# Patient Record
Sex: Female | Born: 1950
Health system: Southern US, Community
[De-identification: ages and names within clinical notes are randomized; demographics above are authoritative.]

## PROBLEM LIST (undated history)

## (undated) DIAGNOSIS — I10 Essential (primary) hypertension: Secondary | ICD-10-CM

## (undated) DIAGNOSIS — T7840XA Allergy, unspecified, initial encounter: Secondary | ICD-10-CM

## (undated) DIAGNOSIS — E7800 Pure hypercholesterolemia, unspecified: Secondary | ICD-10-CM

## (undated) DIAGNOSIS — M1990 Unspecified osteoarthritis, unspecified site: Secondary | ICD-10-CM

## (undated) DIAGNOSIS — K219 Gastro-esophageal reflux disease without esophagitis: Secondary | ICD-10-CM

## (undated) DIAGNOSIS — H269 Unspecified cataract: Secondary | ICD-10-CM

## (undated) DIAGNOSIS — L409 Psoriasis, unspecified: Secondary | ICD-10-CM

## (undated) DIAGNOSIS — Z5189 Encounter for other specified aftercare: Secondary | ICD-10-CM

## (undated) DIAGNOSIS — F1010 Alcohol abuse, uncomplicated: Secondary | ICD-10-CM

## (undated) DIAGNOSIS — R911 Solitary pulmonary nodule: Secondary | ICD-10-CM

## (undated) DIAGNOSIS — F419 Anxiety disorder, unspecified: Secondary | ICD-10-CM

## (undated) DIAGNOSIS — K635 Polyp of colon: Secondary | ICD-10-CM

## (undated) DIAGNOSIS — Z8659 Personal history of other mental and behavioral disorders: Secondary | ICD-10-CM

## (undated) DIAGNOSIS — E079 Disorder of thyroid, unspecified: Secondary | ICD-10-CM

## (undated) DIAGNOSIS — C801 Malignant (primary) neoplasm, unspecified: Secondary | ICD-10-CM

## (undated) HISTORY — PX: CYST REMOVAL HAND: SHX6279

## (undated) HISTORY — DX: Solitary pulmonary nodule: R91.1

## (undated) HISTORY — DX: Psoriasis, unspecified: L40.9

## (undated) HISTORY — DX: Personal history of other mental and behavioral disorders: Z86.59

## (undated) HISTORY — DX: Encounter for other specified aftercare: Z51.89

## (undated) HISTORY — DX: Unspecified osteoarthritis, unspecified site: M19.90

## (undated) HISTORY — DX: Unspecified cataract: H26.9

## (undated) HISTORY — DX: Polyp of colon: K63.5

## (undated) HISTORY — DX: Gastro-esophageal reflux disease without esophagitis: K21.9

## (undated) HISTORY — DX: Anxiety disorder, unspecified: F41.9

## (undated) HISTORY — DX: Allergy, unspecified, initial encounter: T78.40XA

## (undated) HISTORY — DX: Alcohol abuse, uncomplicated: F10.10

## (undated) HISTORY — PX: DILATION AND CURETTAGE OF UTERUS: SHX78

## (undated) HISTORY — DX: Disorder of thyroid, unspecified: E07.9

## (undated) HISTORY — DX: Malignant (primary) neoplasm, unspecified: C80.1

## (undated) HISTORY — PX: COLONOSCOPY: SHX174

---

## 1987-06-06 DIAGNOSIS — Z5189 Encounter for other specified aftercare: Secondary | ICD-10-CM

## 1987-06-06 HISTORY — DX: Encounter for other specified aftercare: Z51.89

## 1998-06-05 HISTORY — PX: BREAST BIOPSY: SHX20

## 2007-07-03 ENCOUNTER — Encounter: Payer: Self-pay | Admitting: Cardiology

## 2011-07-04 LAB — HM COLONOSCOPY

## 2013-02-05 ENCOUNTER — Emergency Department (HOSPITAL_COMMUNITY)
Admission: EM | Admit: 2013-02-05 | Discharge: 2013-02-05 | Disposition: A | Discharge status: Home / Self Care | Payer: BC Managed Care – PPO | Attending: Internal Medicine | Admitting: Internal Medicine

## 2013-02-05 ENCOUNTER — Encounter (HOSPITAL_COMMUNITY): Payer: Self-pay | Admitting: Emergency Medicine

## 2013-02-05 ENCOUNTER — Emergency Department (HOSPITAL_COMMUNITY): Payer: BC Managed Care – PPO

## 2013-02-05 DIAGNOSIS — E785 Hyperlipidemia, unspecified: Secondary | ICD-10-CM | POA: Diagnosis present

## 2013-02-05 DIAGNOSIS — R0789 Other chest pain: Secondary | ICD-10-CM | POA: Insufficient documentation

## 2013-02-05 DIAGNOSIS — F329 Major depressive disorder, single episode, unspecified: Secondary | ICD-10-CM | POA: Diagnosis present

## 2013-02-05 DIAGNOSIS — R079 Chest pain, unspecified: Secondary | ICD-10-CM

## 2013-02-05 DIAGNOSIS — Z862 Personal history of diseases of the blood and blood-forming organs and certain disorders involving the immune mechanism: Secondary | ICD-10-CM | POA: Insufficient documentation

## 2013-02-05 DIAGNOSIS — F3289 Other specified depressive episodes: Secondary | ICD-10-CM

## 2013-02-05 DIAGNOSIS — Z7982 Long term (current) use of aspirin: Secondary | ICD-10-CM | POA: Insufficient documentation

## 2013-02-05 DIAGNOSIS — R0602 Shortness of breath: Secondary | ICD-10-CM | POA: Insufficient documentation

## 2013-02-05 DIAGNOSIS — Z8639 Personal history of other endocrine, nutritional and metabolic disease: Secondary | ICD-10-CM | POA: Insufficient documentation

## 2013-02-05 DIAGNOSIS — R5381 Other malaise: Secondary | ICD-10-CM | POA: Insufficient documentation

## 2013-02-05 DIAGNOSIS — I10 Essential (primary) hypertension: Secondary | ICD-10-CM

## 2013-02-05 DIAGNOSIS — R42 Dizziness and giddiness: Secondary | ICD-10-CM | POA: Insufficient documentation

## 2013-02-05 DIAGNOSIS — K219 Gastro-esophageal reflux disease without esophagitis: Secondary | ICD-10-CM

## 2013-02-05 DIAGNOSIS — Z79899 Other long term (current) drug therapy: Secondary | ICD-10-CM | POA: Insufficient documentation

## 2013-02-05 HISTORY — DX: Pure hypercholesterolemia, unspecified: E78.00

## 2013-02-05 HISTORY — DX: Essential (primary) hypertension: I10

## 2013-02-05 LAB — BASIC METABOLIC PANEL
BUN: 10 mg/dL (ref 6–23)
CO2: 28 mEq/L (ref 19–32)
Calcium: 9.6 mg/dL (ref 8.4–10.5)
Creatinine, Ser: 0.58 mg/dL (ref 0.50–1.10)
GFR calc non Af Amer: >90 mL/min (ref >90)
Glucose, Bld: 114 mg/dL — ABNORMAL HIGH (ref 70–99)

## 2013-02-05 LAB — CBC
HCT: 40.5 % (ref 36.0–46.0)
Hemoglobin: 14.1 g/dL (ref 12.0–15.0)
MCH: 30.9 pg (ref 26.0–34.0)
MCHC: 34.8 g/dL (ref 30.0–36.0)
MCV: 88.8 fL (ref 78.0–100.0)
RBC: 4.56 MIL/uL (ref 3.87–5.11)

## 2013-02-05 NOTE — Consult Note (Signed)
[name] Medical Consultation  Christy Freeman EML:[id number] DOB: [birth year] DOA: 02/05/2013 PCP: Pcp Not In System   Requesting physician: Thurnell Garbe Date of consultation: 02/05/13 Reason for consultation: chest discomfort  Impression/Recommendations    Chest pain: Patient's pain is atypical. She currently feels well and is requesting discharge. She has had 2 negative troponins, and EKG is normal. She may be discharged home and I have arranged followup with [facility] to consider outpatient stress test. Active Problems:   Hypertension: Patient reports her blood pressure was quite this morning. Currently well controlled. Continue outpatient regimen.   GERD (gastroesophageal reflux disease): Continue omeprazole   Hyperlipidemia, reportedly diet controlled   Depression  Chief Complaint: Chest discomfort and high blood pressure  HPI:  Patient is a 62 year old white female with history of hypertension who had chest tightness yesterday after eating [place] food than sitting in a hot car. She woke up at 2 AM and "felt funny". Her blood pressure was reportedly very high when she checked it at home. It was high again this morning so she came to the emergency room. Currently she has no chest pain. She feels fine. She has had a lot of life stressors recently and thinks her symptoms may be related to that, or possibly indigestion. She had a stress echocardiogram 15 years ago which was reportedly unremarkable. She has a history of hypertension and reportedly diet controlled hyperlipidemia. Her brother had an MI in his early 69s. Hospitalists were consulted for admission, the patient is currently requesting discharge.  Review of Systems:  Systems reviewed and as above otherwise negative.  Past Medical History  Diagnosis Date  . Hypertension   . Hypercholesterolemia     diet controlled   History reviewed. No pertinent past surgical history. Social History: Patient is married. She does not  smoke. She reports drinking "too much". She drinks about a bottle of wine daily, sometimes more, sometimes less. Occasionally has a gin and tonic as well.  No Known Allergies Family History  Problem Relation Age of Onset  . Diabetes Mother   . Heart failure Mother   . Stroke Father   . Heart disease Brother     Prior to Admission medications   Medication Sig Start Date End Date Taking? Authorizing Provider  amLODipine-olmesartan (AZOR) 5-40 MG per tablet Take 1 tablet by mouth daily.   Yes Historical Provider, MD  aspirin EC 81 MG tablet Take 81 mg by mouth 2 (two) times daily.   Yes Historical Provider, MD  calcium carbonate (OS-CAL - DOSED IN MG OF ELEMENTAL CALCIUM) 1250 MG tablet Take 1,250 mg by mouth daily.    Yes Historical Provider, MD  metoprolol succinate (TOPROL-XL) 100 MG 24 hr tablet Take 100 mg by mouth daily. Take with or immediately following a meal.   Yes Historical Provider, MD  omeprazole (PRILOSEC) 20 MG capsule Take 20 mg by mouth daily.   Yes Historical Provider, MD  venlafaxine XR (EFFEXOR-XR) 150 MG 24 hr capsule Take 150 mg by mouth daily.   Yes Historical Provider, MD   Physical Exam: Blood pressure 153/82, pulse 82, temperature 97.7 F (36.5 C), temperature source Oral, resp. rate 21, SpO2 97.00%. Filed Vitals:   02/05/13 1630  BP: 153/82  Pulse: 82  Temp:   Resp: 21   BP 153/82  Pulse 82  Temp(Src) 97.7 F (36.5 C) (Oral)  Resp 21  SpO2 97%  General Appearance:    Alert, cooperative, no distress, appears stated age  Head:  Normocephalic, without obvious abnormality, atraumatic  Eyes:    PERRL, conjunctiva/corneas clear, EOM's intact, fundi    benign, both eyes     Nose:   Nares normal, septum midline, mucosa normal, no drainage    or sinus tenderness  Throat:   Lips, mucosa, and tongue normal; teeth and gums normal  Neck:   Supple, symmetrical, trachea midline, no adenopathy;    thyroid:  no enlargement/tenderness/nodules; no carotid    bruit or JVD  Back:     Symmetric, no curvature, ROM normal, no CVA tenderness  Lungs:     Clear to auscultation bilaterally, respirations unlabored  Chest Wall:    No tenderness or deformity   Heart:    Regular rate and rhythm, S1 and S2 normal, no murmur, rub   or gallop     Abdomen:     Soft, non-tender, bowel sounds active all four quadrants,    no masses, no organomegaly  Genitalia:   deferred  Rectal:   deferred  Extremities:   Extremities normal, atraumatic, no cyanosis or edema  Pulses:   2+ and symmetric all extremities  Skin:   Skin color, texture, turgor normal, no rashes or lesions  Lymph nodes:   Cervical, supraclavicular, and axillary nodes normal  Neurologic:   CNII-XII intact, normal strength, sensation and reflexes    throughout    Psychiatric: normal affect  Labs on Admission:  Basic Metabolic Panel:  Recent Labs Lab 02/05/13 1200  NA 136  K 4.0  CL 99  CO2 28  GLUCOSE 114*  BUN 10  CREATININE 0.58  CALCIUM 9.6   Liver Function Tests: No results found for this basename: AST, ALT, ALKPHOS, BILITOT, PROT, ALBUMIN,  in the last 168 hours No results found for this basename: LIPASE, AMYLASE,  in the last 168 hours No results found for this basename: AMMONIA,  in the last 168 hours CBC:  Recent Labs Lab 02/05/13 1200  WBC 10.0  HGB 14.1  HCT 40.5  MCV 88.8  PLT 308   Cardiac Enzymes:  Recent Labs Lab 02/05/13 1224 02/05/13 1643  TROPONINI <0.30 <0.30   BNP: No components found with this basename: POCBNP,  CBG: No results found for this basename: GLUCAP,  in the last 168 hours  Radiological Exams on Admission: Dg Chest 2 View  02/05/2013   *RADIOLOGY REPORT*  Clinical Data: Chest pain and pressure, dyspnea, history hypertension  CHEST - 2 VIEW  Comparison: None.  Findings: Normal cardiac silhouette and mediastinal contours.  No focal parenchymal opacities.  No pleural effusion or pneumothorax. No evidence of edema.  No acute osseous  abnormalities.  IMPRESSION: No acute cardiopulmonary disease.   Original Report Authenticated By: Jake Seats, MD    EKG: NSR  Time spent: 60 minutes  [facility] Pager [phone]  If 7PM-7AM, please contact night-coverage [email or web address] Password [facility] 02/05/2013, 6:04 PM

## 2013-02-05 NOTE — ED Notes (Signed)
Pt c/o generalized CP and heaviness x 2 days with light headedness; pt sts after eating [place] but denies hx of indigestion afterwards; pt sts some SOB and htn

## 2013-02-05 NOTE — ED Notes (Signed)
Introduced self to pt and husband.  Pt denies chest pain/heaviness at present.  Eating crackers and peanut butter.  Skin pink, wm, dry.  SR on monitor, see vitals.

## 2013-02-05 NOTE — ED Provider Notes (Signed)
CSN: [id number]     Arrival date & time 02/05/13  1149 History   First MD Initiated Contact with Patient 02/05/13 1223     Chief Complaint  Patient presents with  . Chest Pain    HPI Pt was seen at 1255. Per pt and her husband, c/o gradual onset and persistence of multiple intermittent episodes of chest discomfort and SOB since yesterday. States she was "out shopping in the heat" and recurrently felt chest "heaviness"/"pressure" and SOB when she walked to her car and sat down. Symptoms resolved with sitting. Describes her SOB as "hard to breath in."  Pt states the chest discomfort also occurred last evening. States her symptoms were resolved by this morning but she "just doesn't feel right" today, "weak" and "lightheaded." States her BP this morning was "180/100," which is unusual for her. Denies palpitations, no cough, no abd pain, no N/V/D, no back pain.    PMD: Dr. Vista Lawman in [place] Past Medical History  Diagnosis Date  . Hypertension   . Hypercholesterolemia     diet controlled   History reviewed. No pertinent past surgical history.   Family History  Problem Relation Age of Onset  . Diabetes Mother   . Heart failure Mother   . Stroke Father    History  Substance Use Topics  . Smoking status: Never Smoker   . Smokeless tobacco: Not on file  . Alcohol Use: Yes    Review of Systems ROS: Statement: All systems negative except as marked or noted in the HPI; Constitutional: Negative for fever and chills. ; ; Eyes: Negative for eye pain, redness and discharge. ; ; ENMT: Negative for ear pain, hoarseness, nasal congestion, sinus pressure and sore throat. ; ; Cardiovascular: +CP, SOB. Negative for palpitations, diaphoresis, and peripheral edema. ; ; Respiratory: Negative for cough, wheezing and stridor. ; ; Gastrointestinal: Negative for nausea, vomiting, diarrhea, abdominal pain, blood in stool, hematemesis, jaundice and rectal bleeding. . ; ; Genitourinary: Negative for dysuria, flank  pain and hematuria. ; ; Musculoskeletal: Negative for back pain and neck pain. Negative for swelling and trauma.; ; Skin: Negative for pruritus, rash, abrasions, blisters, bruising and skin lesion.; ; Neuro: Negative for headache, lightheadedness and neck stiffness. Negative for weakness, altered level of consciousness , altered mental status, extremity weakness, paresthesias, involuntary movement, seizure and syncope.       Allergies  Review of patient's allergies indicates no known allergies.  Home Medications   Current Outpatient Rx  Name  Route  Sig  Dispense  Refill  . amLODipine-olmesartan (AZOR) 5-40 MG per tablet   Oral   Take 1 tablet by mouth daily.         Marland Kitchen aspirin EC 81 MG tablet   Oral   Take 81 mg by mouth 2 (two) times daily.         . calcium carbonate (OS-CAL - DOSED IN MG OF ELEMENTAL CALCIUM) 1250 MG tablet   Oral   Take 1,250 mg by mouth daily.          . metoprolol succinate (TOPROL-XL) 100 MG 24 hr tablet   Oral   Take 100 mg by mouth daily. Take with or immediately following a meal.         . omeprazole (PRILOSEC) 20 MG capsule   Oral   Take 20 mg by mouth daily.         Marland Kitchen venlafaxine XR (EFFEXOR-XR) 150 MG 24 hr capsule   Oral   Take 150 mg  by mouth daily.          BP 141/87  Pulse 72  Temp(Src) 97.7 F (36.5 C) (Oral)  Resp 13  SpO2 95% Physical Exam 1300: Physical examination:  Nursing notes reviewed; Vital signs and O2 SAT reviewed;  Constitutional: Well developed, Well nourished, Well hydrated, In no acute distress; Head:  Normocephalic, atraumatic; Eyes: EOMI, PERRL, No scleral icterus; ENMT: Mouth and pharynx normal, Mucous membranes moist; Neck: Supple, Full range of motion, No lymphadenopathy; Cardiovascular: Regular rate and rhythm, No gallop; Respiratory: Breath sounds clear & equal bilaterally, No rales, rhonchi, wheezes.  Speaking full sentences with ease, Normal respiratory effort/excursion; Chest: Nontender, Movement  normal; Abdomen: Soft, Nontender, Nondistended, Normal bowel sounds; Genitourinary: No CVA tenderness; Extremities: Pulses normal, No tenderness, No edema, No calf edema or asymmetry.; Neuro: AA&Ox3, Major CN grossly intact.  Speech clear. No gross focal motor or sensory deficits in extremities.; Skin: Color normal, Warm, Dry.   ED Course  Procedures      MDM  MDM Reviewed: previous chart, nursing note and vitals Reviewed previous: labs Interpretation: labs, ECG and x-ray    Date: 02/05/2013  Rate: 74  Rhythm: normal sinus rhythm  QRS Axis: normal  Intervals: normal  ST/T Wave abnormalities: normal  Conduction Disutrbances:none  Narrative Interpretation:   Old EKG Reviewed: none available.  Results for orders placed during the hospital encounter of 02/05/13  CBC      Result Value Range   WBC 10.0  4.0 - 10.5 K/uL   RBC 4.56  3.87 - 5.11 MIL/uL   Hemoglobin 14.1  12.0 - 15.0 g/dL   HCT 40.5  36.0 - 46.0 %   MCV 88.8  78.0 - 100.0 fL   MCH 30.9  26.0 - 34.0 pg   MCHC 34.8  30.0 - 36.0 g/dL   RDW 13.3  11.5 - 15.5 %   Platelets 308  150 - 400 K/uL  BASIC METABOLIC PANEL      Result Value Range   Sodium 136  135 - 145 mEq/L   Potassium 4.0  3.5 - 5.1 mEq/L   Chloride 99  96 - 112 mEq/L   CO2 28  19 - 32 mEq/L   Glucose, Bld 114 (*) 70 - 99 mg/dL   BUN 10  6 - 23 mg/dL   Creatinine, Ser 0.58  0.50 - 1.10 mg/dL   Calcium 9.6  8.4 - 10.5 mg/dL   GFR calc non Af Amer >90  >90 mL/min   GFR calc Af Amer >90  >90 mL/min  TROPONIN I      Result Value Range   Troponin I <0.30  <0.30 ng/mL  POCT I-STAT TROPONIN I      Result Value Range   Troponin i, poc 0.00  0.00 - 0.08 ng/mL   Comment 3            Dg Chest 2 View 02/05/2013   *RADIOLOGY REPORT*  Clinical Data: Chest pain and pressure, dyspnea, history hypertension  CHEST - 2 VIEW  Comparison: None.  Findings: Normal cardiac silhouette and mediastinal contours.  No focal parenchymal opacities.  No pleural effusion or  pneumothorax. No evidence of edema.  No acute osseous abnormalities.  IMPRESSION: No acute cardiopulmonary disease.   Original Report Authenticated By: Jake Seats, MD     1550:  Pt states she "ate [place] food yesterday" but denies hx of indigestion or hx of these/similar symptoms after eating certain foods. Pt TIMI 2. Dx and testing  d/w pt and family.  Questions answered.  Verb understanding, agreeable to evaluation by [facility] for admit.  T/C to [facility] Dr. Conley [facility], case discussed, including:  HPI, pertinent PM/SHx, VS/PE, dx testing, ED course and treatment:  Agreeable to come to ED for eval.          Alfonzo Feller, DO 02/05/13 6270

## 2013-02-05 NOTE — ED Provider Notes (Signed)
6:55 PM Dr. Conley Canal has discharged patient after negative delta troponin.    Christy Siren, MD 02/05/13 [phone]

## 2013-02-05 NOTE — Discharge Instructions (Signed)
Chest Pain (Nonspecific) °It is often hard to give a specific diagnosis for the cause of chest pain. There is always a chance that your pain could be related to something serious, such as a heart attack or a blood clot in the lungs. You need to follow up with your caregiver for further evaluation. °CAUSES  °· Heartburn. °· Pneumonia or bronchitis. °· Anxiety or stress. °· Inflammation around your heart (pericarditis) or lung (pleuritis or pleurisy). °· A blood clot in the lung. °· A collapsed lung (pneumothorax). It can develop suddenly on its own (spontaneous pneumothorax) or from injury (trauma) to the chest. °· Shingles infection (herpes zoster virus). °The chest wall is composed of bones, muscles, and cartilage. Any of these can be the source of the pain. °· The bones can be bruised by injury. °· The muscles or cartilage can be strained by coughing or overwork. °· The cartilage can be affected by inflammation and become sore (costochondritis). °DIAGNOSIS  °Lab tests or other studies, such as X-rays, electrocardiography, stress testing, or cardiac imaging, may be needed to find the cause of your pain.  °TREATMENT  °· Treatment depends on what may be causing your chest pain. Treatment may include: °· Acid blockers for heartburn. °· Anti-inflammatory medicine. °· Pain medicine for inflammatory conditions. °· Antibiotics if an infection is present. °· You may be advised to change lifestyle habits. This includes stopping smoking and avoiding alcohol, caffeine, and chocolate. °· You may be advised to keep your head raised (elevated) when sleeping. This reduces the chance of acid going backward from your stomach into your esophagus. °· Most of the time, nonspecific chest pain will improve within 2 to 3 days with rest and mild pain medicine. °HOME CARE INSTRUCTIONS  °· If antibiotics were prescribed, take your antibiotics as directed. Finish them even if you start to feel better. °· For the next few days, avoid physical  activities that bring on chest pain. Continue physical activities as directed. °· Do not smoke. °· Avoid drinking alcohol. °· Only take over-the-counter or prescription medicine for pain, discomfort, or fever as directed by your caregiver. °· Follow your caregiver's suggestions for further testing if your chest pain does not go away. °· Keep any follow-up appointments you made. If you do not go to an appointment, you could develop lasting (chronic) problems with pain. If there is any problem keeping an appointment, you must call to reschedule. °SEEK MEDICAL CARE IF:  °· You think you are having problems from the medicine you are taking. Read your medicine instructions carefully. °· Your chest pain does not go away, even after treatment. °· You develop a rash with blisters on your chest. °SEEK IMMEDIATE MEDICAL CARE IF:  °· You have increased chest pain or pain that spreads to your arm, neck, jaw, back, or abdomen. °· You develop shortness of breath, an increasing cough, or you are coughing up blood. °· You have severe back or abdominal pain, feel nauseous, or vomit. °· You develop severe weakness, fainting, or chills. °· You have a fever. °THIS IS AN EMERGENCY. Do not wait to see if the pain will go away. Get medical help at once. Call your local emergency services (911 in [place] Do not drive yourself to the hospital. °MAKE SURE YOU:  °· Understand these instructions. °· Will watch your condition. °· Will get help right away if you are not doing well or get worse. °Document Released: 03/01/2005 Document Revised: 08/14/2011 Document Reviewed: 12/26/2007 °Exit[employer]® Patient Information ©2014 [employer]   [employer]. ° °

## 2013-02-05 NOTE — ED Notes (Signed)
No change in status.  Decision to recheck troponin and if negative, will discharge pt to home.

## 2013-03-06 ENCOUNTER — Encounter: Payer: Self-pay | Admitting: Cardiology

## 2013-03-18 ENCOUNTER — Encounter: Payer: Self-pay | Admitting: *Deleted

## 2013-03-18 ENCOUNTER — Other Ambulatory Visit: Payer: Self-pay | Admitting: *Deleted

## 2013-03-21 ENCOUNTER — Ambulatory Visit (INDEPENDENT_AMBULATORY_CARE_PROVIDER_SITE_OTHER): Payer: BC Managed Care – PPO | Admitting: Cardiology

## 2013-03-21 ENCOUNTER — Ambulatory Visit: Payer: BC Managed Care – PPO | Admitting: Cardiology

## 2013-03-21 ENCOUNTER — Encounter: Payer: Self-pay | Admitting: Cardiology

## 2013-03-21 VITALS — BP 146/86 | HR 76 | Ht 64.0 in | Wt 187.0 lb

## 2013-03-21 DIAGNOSIS — R079 Chest pain, unspecified: Secondary | ICD-10-CM

## 2013-03-21 DIAGNOSIS — E785 Hyperlipidemia, unspecified: Secondary | ICD-10-CM

## 2013-03-21 MED ORDER — LISINOPRIL 40 MG PO TABS: 40.0000 mg | ORAL_TABLET | Freq: Every day | ORAL | Status: DC

## 2013-03-21 MED ORDER — AMLODIPINE BESYLATE 10 MG PO TABS: 10.0000 mg | ORAL_TABLET | Freq: Every day | ORAL | Status: DC

## 2013-03-21 NOTE — Progress Notes (Signed)
Patient ID: Trinidy Masterson, female   DOB: [birth year], 62 y.o.   MRN: [id number]    Patient Name: Christy Freeman Date of Encounter: 03/21/2013  Primary Care Provider:  Pcp Not In System Primary Cardiologist:  Ena Dawley, H  Patient Profile Post ER visit for CP  Problem List   Past Medical History  Diagnosis Date  . Hypertension   . Hypercholesterolemia     diet controlled  . GERD (gastroesophageal reflux disease)   . H/O: depression    No past surgical history on file.  Allergies  No Known Allergies  HPI  62 year old female with HTN, HLP (diet controlled), that went to ER the last month for multiple episodes of chest pain with gradual onset and persistence associated with SOB for 2 days. States she was "out shopping in the heat" and recurrently felt chest "heaviness"/"pressure" and SOB when she walked to her car and sat down. Symptoms resolved with sitting. Describes her SOB as "hard to breath in." She was found to have BP 180/100. ACS was ruled out. No chest pain episodes for the last month.   Home Medications  Prior to Admission medications   Medication Sig Start Date End Date Taking? Authorizing Provider  amLODipine-olmesartan (AZOR) 5-40 MG per tablet Take 1 tablet by mouth daily.   Yes Historical Provider, MD  aspirin EC 81 MG tablet Take 81 mg by mouth 2 (two) times daily.   Yes Historical Provider, MD  calcium carbonate (OS-CAL - DOSED IN MG OF ELEMENTAL CALCIUM) 1250 MG tablet Take 1,250 mg by mouth daily.    Yes Historical Provider, MD  metoprolol succinate (TOPROL-XL) 100 MG 24 hr tablet Take 100 mg by mouth daily. Take with or immediately following a meal.   Yes Historical Provider, MD  omeprazole (PRILOSEC) 20 MG capsule Take 20 mg by mouth daily.   Yes Historical Provider, MD  venlafaxine XR (EFFEXOR-XR) 150 MG 24 hr capsule Take 150 mg by mouth daily.   Yes Historical Provider, MD    Family History  Family History  Problem Relation Age of Onset  .  Diabetes Mother   . Heart failure Mother   . Stroke Father   . Heart disease Brother     Social History  History   Social History  . Marital Status: Married    Spouse Name: N/A    Number of Children: N/A  . Years of Education: N/A   Occupational History  . Not on file.   Social History Main Topics  . Smoking status: Never Smoker   . Smokeless tobacco: Not on file  . Alcohol Use: Yes  . Drug Use: No  . Sexual Activity: Not on file   Other Topics Concern  . Not on file   Social History Narrative  . No narrative on file     Review of Systems General:  No chills, fever, night sweats or weight changes.  Cardiovascular:  No chest pain, dyspnea on exertion, edema, orthopnea, palpitations, paroxysmal nocturnal dyspnea. Dermatological: No rash, lesions/masses Respiratory: No cough, dyspnea Urologic: No hematuria, dysuria Abdominal:   No nausea, vomiting, diarrhea, bright red blood per rectum, melena, or hematemesis Neurologic:  No visual changes, wkns, changes in mental status. All other systems reviewed and are otherwise negative except as noted above.  Physical Exam  Blood pressure 146/86, pulse 76, height 5' 4"  (1.626 m), weight 187 lb (84.823 kg), SpO2 97.00%.  General: Pleasant, NAD Psych: Normal affect. Neuro: Alert and oriented X 3. Moves all  extremities spontaneously. HEENT: Normal  Neck: Supple without bruits or JVD. Lungs:  Resp regular and unlabored, CTA. Heart: RRR no s3, s4, or murmurs. Abdomen: Soft, non-tender, non-distended, BS + x 4.  Extremities: No clubbing, cyanosis or edema. DP/PT/Radials 2+ and equal bilaterally.  Accessory Clinical Findings  ECG - SR, 71 BPM, normal ECG  Assessment & Plan  A very pleasant 62 year old female with h/o poorly controlled HTN and new chest pain  1. Chest pain seems to be related to the episodes of high blood pressure. We will increase amlodipine to 10 mg daily, switch olmesartan to lisinopril 40 mg daily,  continue toprol xl 100 mg daily, we will schedule an exercise stress test that will assess BP control to stress on this new BP regimen and also evaluate for ischemia   2. HTN - as above, check echocardiogram to evaluate for LV fx, diast fx, LVH  3. HLP - on diet, we will check lipid prifile and CMP  Follow up in 3 weeks   Ena Dawley, Lemmie Evens, MD 03/21/2013, 10:15 AM

## 2013-03-21 NOTE — Patient Instructions (Addendum)
**Note De-Identified Chrystel Barefield Obfuscation** Your physician has requested that you have an echocardiogram. Echocardiography is a painless test that uses sound waves to create images of your heart. It provides your doctor with information about the size and shape of your heart and how well your heart's chambers and valves are working. This procedure takes approximately one hour. There are no restrictions for this procedure.  Your physician has requested that you have en exercise stress myoview. For further information please visit [email or web address]. Please follow instruction sheet, as given.  Your physician has recommended you make the following change in your medication: stop taking Azor and start taking Lisinopril 40 mg daily and Amlodipine 10 mg daily   Your physician recommends that you return for lab work in: on day that you have Stress test. Please do not eat or drink after midnight the night before labs are drawn.  Your physician recommends that you schedule a follow-up appointment in: 3 weeks

## 2013-03-24 ENCOUNTER — Other Ambulatory Visit (INDEPENDENT_AMBULATORY_CARE_PROVIDER_SITE_OTHER): Payer: BC Managed Care – PPO

## 2013-03-24 ENCOUNTER — Ambulatory Visit (HOSPITAL_COMMUNITY): Payer: BC Managed Care – PPO | Attending: Cardiology | Admitting: Radiology

## 2013-03-24 VITALS — BP 137/77 | HR 77 | Ht 64.0 in | Wt 182.0 lb

## 2013-03-24 DIAGNOSIS — R42 Dizziness and giddiness: Secondary | ICD-10-CM | POA: Insufficient documentation

## 2013-03-24 DIAGNOSIS — R0789 Other chest pain: Secondary | ICD-10-CM | POA: Insufficient documentation

## 2013-03-24 DIAGNOSIS — Z8249 Family history of ischemic heart disease and other diseases of the circulatory system: Secondary | ICD-10-CM | POA: Insufficient documentation

## 2013-03-24 DIAGNOSIS — R0602 Shortness of breath: Secondary | ICD-10-CM | POA: Insufficient documentation

## 2013-03-24 DIAGNOSIS — I10 Essential (primary) hypertension: Secondary | ICD-10-CM | POA: Insufficient documentation

## 2013-03-24 DIAGNOSIS — E785 Hyperlipidemia, unspecified: Secondary | ICD-10-CM

## 2013-03-24 DIAGNOSIS — R079 Chest pain, unspecified: Secondary | ICD-10-CM

## 2013-03-24 LAB — LIPID PANEL
Cholesterol: 274 mg/dL — ABNORMAL HIGH (ref 0–200)
HDL: 60.4 mg/dL (ref >39.00)
Total CHOL/HDL Ratio: 5
Triglycerides: 135 mg/dL (ref 0.0–149.0)
VLDL: 27 mg/dL (ref 0.0–40.0)

## 2013-03-24 LAB — COMPREHENSIVE METABOLIC PANEL
ALT: 47 U/L — ABNORMAL HIGH (ref 0–35)
AST: 34 U/L (ref 0–37)
Albumin: 4.3 g/dL (ref 3.5–5.2)
Alkaline Phosphatase: 122 U/L — ABNORMAL HIGH (ref 39–117)
BUN: 14 mg/dL (ref 6–23)
CO2: 30 mEq/L (ref 19–32)
Calcium: 9.5 mg/dL (ref 8.4–10.5)
Chloride: 99 mEq/L (ref 96–112)
Creatinine, Ser: 0.6 mg/dL (ref 0.4–1.2)
GFR: 116.32 mL/min (ref >60.00)
Glucose, Bld: 99 mg/dL (ref 70–99)
Potassium: 4.2 mEq/L (ref 3.5–5.1)
Sodium: 136 mEq/L (ref 135–145)
Total Bilirubin: 0.6 mg/dL (ref 0.3–1.2)
Total Protein: 8.2 g/dL (ref 6.0–8.3)

## 2013-03-24 LAB — LDL CHOLESTEROL, DIRECT: Direct LDL: 191.3 mg/dL

## 2013-03-24 MED ORDER — TECHNETIUM TC 99M SESTAMIBI GENERIC - CARDIOLITE
10.0000 | Freq: Once | INTRAVENOUS | Status: AC | PRN
  Administered 2013-03-24: 10 via INTRAVENOUS

## 2013-03-24 MED ORDER — TECHNETIUM TC 99M SESTAMIBI GENERIC - CARDIOLITE
30.0000 | Freq: Once | INTRAVENOUS | Status: AC | PRN
  Administered 2013-03-24: 30 via INTRAVENOUS

## 2013-03-24 NOTE — Progress Notes (Signed)
[facility] [address] [place], [place] [address] [phone]    Cardiology Nuclear Med Study  Christy Freeman is a 62 y.o. female     MRN : [id number]     DOB: 11/08/1950  Procedure Date: 03/24/2013  Nuclear Med Background Indication for Stress Test:  Evaluation for Ischemia, and Patient seen in hospital on 02-2013 for chest pain, SOB, Enzymes negative. History: No prior known history of CAD; 15 yrs ago: Myocardial Perfusion Imaging-Ok per patient Cardiac Risk Factors: Family History - CAD, Hypertension and Lipids  Symptoms:  Chest Tightness and Light-Headedness   Nuclear Pre-Procedure Caffeine/Decaff Intake:  None > 12 hrs NPO After: 6:30pm   Lungs:  clear O2 Sat: 96% on room air. IV 0.9% NS with Angio Cath:  22g  IV Site: L Antecubital x 1, tolerated well IV Started by:  Irven Baltimore, RN  Chest Size (in):  38 Cup Size: C  Height: 5' 4"  (1.626 m)  Weight:  182 lb (82.555 kg)  BMI:  Body mass index is 31.22 kg/(m^2). Tech Comments:  Held Toprol x 24 hrs. Patient complaining of nausea after IV stick, and became pale and clammy. BP 80/60. Patient to trendelenburg position,BP 94/70 HR 60.  IV 0.9% NACL fluids infused with quick improvement of symptoms. BP  120/70 HR 60.O2 sat 96% RA. Symptoms subsided.Patient to sitting position with BP 140/80 HR 78.    Nuclear Med Study 1 or 2 day study: 1 day  Stress Test Type:  Stress  Reading MD: Ena Dawley, MD  Order Authorizing Provider:  Ena Dawley, MD  Resting Radionuclide: Technetium 9mSestamibi  Resting Radionuclide Dose: 11.0 mCi   Stress Radionuclide:  Technetium 925mestamibi  Stress Radionuclide Dose: 33.0 mCi           Stress Protocol Rest HR: 77 Stress HR: 162  Rest BP: 137/77 Stress BP: 183/73  Exercise Time (min): 6:00 METS: 7   Predicted Max HR: 158 bpm % Max HR: 102.53 bpm Rate Pressure Product: [phone] Dose of Adenosine (mg):  n/a Dose of Lexiscan: n/a mg  Dose of  Atropine (mg): n/a Dose of Dobutamine: n/a mcg/kg/min (at max HR)  Stress Test Technologist: ShGlade LloydBS-ES  Nuclear Technologist:  WeJennelle HumanCNMT     Rest Procedure:  Myocardial perfusion imaging was performed at rest 45 minutes following the intravenous administration of Technetium 9976mstamibi. Rest ECG: NSR - Normal EKG  Stress Procedure:  The patient exercised on the treadmill utilizing the [name] Protocol for 6:00 minutes. The patient stopped due to SOB and fatigue and denied any chest pain.  Technetium 38m67mtamibi was injected at peak exercise and myocardial perfusion imaging was performed after a brief delay. Stress ECG: No significant change from baseline ECG  QPS Raw Data Images:  Normal; no motion artifact; normal heart/lung ratio. Stress Images:  Normal homogeneous uptake in all areas of the myocardium. Rest Images:  Normal homogeneous uptake in all areas of the myocardium. Subtraction (SDS):  No evidence of ischemia. Transient Ischemic Dilatation (Normal <1.22):  0.77 Lung/Heart Ratio (Normal <0.45):  0.28  Quantitative Gated Spect Images QGS EDV:  72 ml QGS ESV:  16 ml  Impression Exercise Capacity:  Good exercise capacity. BP Response:  Normal blood pressure response. Clinical Symptoms:  No significant symptoms noted. ECG Impression:  No significant ST segment change suggestive of ischemia. Comparison with Prior Nuclear Study: No images to compare  Overall Impression:  Normal stress nuclear  study.  LV Ejection Fraction: 78%.  LV Wall Motion:  NL LV Function; NL Wall Motion  Ena Dawley, Lemmie Evens 03/24/2013

## 2013-03-28 ENCOUNTER — Telehealth: Payer: Self-pay | Admitting: *Deleted

## 2013-03-28 NOTE — Telephone Encounter (Signed)
**Note De-Identified Christy Freeman Obfuscation** LMTCB. Per Dr Meda Coffee No need for repeat liver US. No statins for hyperlipidemia. The patient needs to stop drinking alcohol.

## 2013-03-28 NOTE — Telephone Encounter (Signed)
Pt returns call. In discussing her lab results, pt has current lab results from her internist in [place].   These labs were drawn on 03/06/13 [facility] in [place].   Alkaline Phosphatase  130   AST  95   ALT  92   She stated that she had information including her last liver ultrasound with her at ov with Dr. Meda Coffee. "I didn't see anywhere to write it down on the information form & it just didn't come up as I talked with Dr. Meda Coffee"  She will fax copies of her recent lab results as well as her Liver Ultrasound done on 07/03/07. She stated her internist had been following her enzymes for a few years b/c she had a fatty liver & looked to be intolerant to alcohol consumption. States she noted when she stopped drinking her liver enzymes dropped. States she is going to counseling as she had started to drink moderately again. She is not drinking at this time.  She will fax her lab & last liver US results to our office today as well as bring a copy of all test/lab results with her at her November appointment.  I will forward this to Dr. Meda Coffee for review. I have not scheduled the liver US at [facility] yet. Horton Chin RN

## 2013-03-28 NOTE — Telephone Encounter (Signed)
LMTCB Horton Chin RN

## 2013-03-28 NOTE — Telephone Encounter (Signed)
Message copied by Verna Czech on Fri Mar 28, 2013 11:52 AM ------      Message from: Christy Freeman      Created: Tue Mar 25, 2013  2:43 PM       Hi Jeani Hawking,      This patient has high cholesterol, but also elevated liver enzymes. Would you order a liver ultrasound  And call her? If she asks her nuclear stress test was negative.      Thank you!      Houston Siren ------

## 2013-03-31 NOTE — Telephone Encounter (Signed)
Left detailed message on pts VM.

## 2013-04-01 ENCOUNTER — Telehealth: Payer: Self-pay | Admitting: Cardiology

## 2013-04-01 NOTE — Telephone Encounter (Signed)
New message    Talk to nurse regarding test results she received in the mail

## 2013-04-01 NOTE — Telephone Encounter (Signed)
LMTCB

## 2013-04-01 NOTE — Telephone Encounter (Signed)
**Note De-Identified Christy Freeman Obfuscation** Pt advised, she verbalized understanding.

## 2013-04-02 ENCOUNTER — Telehealth: Payer: Self-pay | Admitting: Cardiology

## 2013-04-02 NOTE — Telephone Encounter (Signed)
Pt states she has not received the fax of her lab work that [facility] was sending to her.  She has not signed up for MyChart but states she will.  Her fax number is [phone].  She was advised to keep her appointment for the 2 D Echo as it gives the MD different information than the nuclear study.  She states understanding.

## 2013-04-02 NOTE — Telephone Encounter (Signed)
Follow up     Pt is returning RN call  Pt did not recive fax 10/28 and would like a call back.   Pt would like to know if she should keep her Echo Appt.  Is it necessary??   Thanks!

## 2013-04-02 NOTE — Telephone Encounter (Signed)
Left pt a message to call back. 

## 2013-04-04 ENCOUNTER — Encounter: Payer: Self-pay | Admitting: *Deleted

## 2013-04-04 ENCOUNTER — Ambulatory Visit: Payer: BC Managed Care – PPO | Admitting: Cardiology

## 2013-04-04 ENCOUNTER — Other Ambulatory Visit: Payer: Self-pay

## 2013-04-04 ENCOUNTER — Other Ambulatory Visit (INDEPENDENT_AMBULATORY_CARE_PROVIDER_SITE_OTHER): Payer: BC Managed Care – PPO | Admitting: Cardiology

## 2013-04-04 DIAGNOSIS — R0602 Shortness of breath: Secondary | ICD-10-CM

## 2013-04-04 DIAGNOSIS — R079 Chest pain, unspecified: Secondary | ICD-10-CM

## 2013-04-10 ENCOUNTER — Other Ambulatory Visit: Payer: Self-pay

## 2013-04-11 ENCOUNTER — Ambulatory Visit (INDEPENDENT_AMBULATORY_CARE_PROVIDER_SITE_OTHER): Payer: BC Managed Care – PPO | Admitting: Cardiology

## 2013-04-11 VITALS — BP 136/78 | HR 80 | Ht 63.0 in | Wt 178.0 lb

## 2013-04-11 DIAGNOSIS — R079 Chest pain, unspecified: Secondary | ICD-10-CM

## 2013-04-11 DIAGNOSIS — I10 Essential (primary) hypertension: Secondary | ICD-10-CM

## 2013-04-11 DIAGNOSIS — E785 Hyperlipidemia, unspecified: Secondary | ICD-10-CM

## 2013-04-11 NOTE — Progress Notes (Signed)
Patient ID: Christy Freeman, female   DOB: 1951-03-29, 62 y.o.   MRN: [id number]  Patient Name: Christy Freeman Date of Encounter: 04/11/2013  Primary Care Provider:  Pcp Not In System Primary Cardiologist:  Ena Dawley, H  Patient Profile Post ER visit for CP  Problem List   Past Medical History  Diagnosis Date  . Hypertension   . Hypercholesterolemia     diet controlled  . GERD (gastroesophageal reflux disease)   . H/O: depression    No past surgical history on file.  Allergies  No Known Allergies  HPI  62 year old female with HTN, HLP (diet controlled), that went to ER the last month for multiple episodes of chest pain with gradual onset and persistence associated with SOB for 2 days. States she was "out shopping in the heat" and recurrently felt chest "heaviness"/"pressure" and SOB when she walked to her car and sat down. Symptoms resolved with sitting. Describes her SOB as "hard to breath in." She was found to have BP 180/100. ACS was ruled out. No chest pain episodes for the last month.   3 week follow up, she feels better.  Home Medications   Medication List       This list is accurate as of: 04/11/13  8:47 AM.  Always use your most recent med list.               amLODipine 10 MG tablet  Commonly known as:  NORVASC  Take 1 tablet (10 mg total) by mouth daily.     aspirin EC 81 MG tablet  Take 81 mg by mouth 2 (two) times daily.     calcium carbonate 1250 MG tablet  Commonly known as:  OS-CAL - dosed in mg of elemental calcium  Take 1,250 mg by mouth daily.     lisinopril 40 MG tablet  Commonly known as:  PRINIVIL,ZESTRIL  Take 1 tablet (40 mg total) by mouth daily.     metoprolol succinate 100 MG 24 hr tablet  Commonly known as:  TOPROL-XL  Take 100 mg by mouth daily. Take with or immediately following a meal.     omeprazole 20 MG capsule  Commonly known as:  PRILOSEC  Take 20 mg by mouth daily.     venlafaxine XR 150 MG 24 hr capsule    Commonly known as:  EFFEXOR-XR  Take 150 mg by mouth daily.        Family History  Family History  Problem Relation Age of Onset  . Diabetes Mother   . Heart failure Mother   . Stroke Father   . Heart disease Brother     Social History  History   Social History  . Marital Status: Married    Spouse Name: N/A    Number of Children: N/A  . Years of Education: N/A   Occupational History  . Not on file.   Social History Main Topics  . Smoking status: Never Smoker   . Smokeless tobacco: Not on file  . Alcohol Use: Yes  . Drug Use: No  . Sexual Activity: Not on file   Other Topics Concern  . Not on file   Social History Narrative  . No narrative on file     Review of Systems General:  No chills, fever, night sweats or weight changes.  Cardiovascular:  No chest pain, dyspnea on exertion, edema, orthopnea, palpitations, paroxysmal nocturnal dyspnea. Dermatological: No rash, lesions/masses Respiratory: No cough, dyspnea Urologic: No hematuria, dysuria  Abdominal:   No nausea, vomiting, diarrhea, bright red blood per rectum, melena, or hematemesis Neurologic:  No visual changes, wkns, changes in mental status. All other systems reviewed and are otherwise negative except as noted above.  Physical Exam  Blood pressure 136/78, pulse 80, height 5' 3"  (1.6 m), weight 178 lb (80.74 kg).  General: Pleasant, NAD Psych: Normal affect. Neuro: Alert and oriented X 3. Moves all extremities spontaneously. HEENT: Normal  Neck: Supple without bruits or JVD. Lungs:  Resp regular and unlabored, CTA. Heart: RRR no s3, s4, or murmurs. Abdomen: Soft, non-tender, non-distended, BS + x 4.  Extremities: No clubbing, cyanosis or edema. DP/PT/Radials 2+ and equal bilaterally.  Accessory Clinical Findings  ECG - SR, 71 BPM, normal ECG  Exercise Nuclear stress test 03/25/13  Quantitative Gated Spect Images  QGS EDV: 72 ml  QGS ESV: 16 ml  Impression  Exercise Capacity: Good  exercise capacity.  BP Response: Normal blood pressure response.  Clinical Symptoms: No significant symptoms noted.  ECG Impression: No significant ST segment change suggestive of ischemia.  Comparison with Prior Nuclear Study: No images to compare  Overall Impression: Normal stress nuclear study.  LV Ejection Fraction: 78%. LV Wall Motion: NL LV Function; NL Wall Motion  Hiyab Nhem, H  03/24/2013  TTE 04/04/13  - Left ventricle: The cavity size was normal. Systolic function was normal. The estimated ejection fraction was in the range of 60% to 65%. Wall motion was normal; there were no regional wall motion abnormalities. - Aortic valve: Trivial regurgitation. - Left atrium: The atrium was normal in size. - Right ventricle: Systolic function was normal. - Pulmonary arteries: Systolic pressure was within the normal range. Impressions:  - Normal study.    Assessment & Plan  A very pleasant 62 year old female with h/o poorly controlled HTN and new chest pain  1. Chest pain seems to be related to the episodes of high blood pressure. After changing her BP meds, her BP is controlled at rest and at stress. She feels better. Echocardiogram is normal, no diastolic dysfunction or LVH. She is encouraged to exercise.  2. HTN - as above, controlled  3. HLP - on diet, LDL high, however fatty liver disease, elevated ALT (etoh), the patient will be referred to lipid clinic  Follow up in 1 year   Ena Dawley, Lemmie Evens, MD 04/11/2013, 8:43 AM

## 2013-04-11 NOTE — Patient Instructions (Signed)
You have been referred to [place] physician wants you to follow-up in: [place] will receive a reminder letter in the mail two months in advance. If you don't receive a letter, please call our office to schedule the follow-up appointment.

## 2013-04-14 ENCOUNTER — Ambulatory Visit (INDEPENDENT_AMBULATORY_CARE_PROVIDER_SITE_OTHER): Payer: BC Managed Care – PPO | Admitting: Pharmacist

## 2013-04-14 VITALS — Wt 185.0 lb

## 2013-04-14 DIAGNOSIS — Z79899 Other long term (current) drug therapy: Secondary | ICD-10-CM

## 2013-04-14 DIAGNOSIS — E785 Hyperlipidemia, unspecified: Secondary | ICD-10-CM

## 2013-04-14 MED ORDER — PRAVASTATIN SODIUM 20 MG PO TABS: 20.0000 mg | ORAL_TABLET | Freq: Every evening | ORAL | Status: DC

## 2013-04-14 NOTE — Patient Instructions (Signed)
1.  Start pravastatin 20 mg daily - take in the evening. 2.  Take Metamucil 2-3 times per day (1 tablespoon powder mixed in full glass of water/juice) 3.  Limit red meat and cheese.  Continue mediterranean diet. 4.  Walk 5 days per week - 30-40 minutes per day. 5.  Limit wine to no more than 4 glasses per week. 6.  Check liver enzymes in 4 weeks (12/8) 7.  Check cholesterol and liver 3 months (1/26) and see Minie Roadcap again on 07/09/12.

## 2013-04-14 NOTE — Progress Notes (Signed)
Patient is a very sweet and intelligent 62 y.o. Female with h/o fatty liver and baseline LDL b/t 190-200 mg/dL.  Patient has never taken lipid lowering therapy before.  She recently went to the ER following chest pains, which were ultimately felt to be due to poorly controlled blood pressure, and now that she is on proper anti-hypertensives, her chest pain has resolved.  ACS was ruled out.  Stress test negative.  She had lowered her LDL to 141 mg/dL in the past with high fiber / low fat diet, exercise, and 30 lbs weight loss.  She has regained all of this weight back unfortunately.   LDL back up to 191 mg/dL now.    Risk Factors:  Age, HTN, family history of early CAD - LDL goal at least < 130 (prefer < 100)  Family history:  Mother (MI at 57 y.o. ; Diabetes at age 73 y.o.).  Father (TIA at 54 y.o.);  Brother (MI at 22 y.o.) Social History:  Drinks wine 4-6 glasses per week now (weekends).  Previously drank 4 nights per week.  She stopped 2009-2011 after seeing fatty liver results, but restarted in 2011 after stress levels increased.   No tobacco use.  [place] and lives a relatively active lifestyle. Liver history:  Patient diagnosed with fatty liver on U/S 06/2007 by Dr. Gaye Pollack [facility], [place]).  Her LFTs went all the way to normal she tells me once she stopped drinking alcohol in 2009 - then went up after restating alcohol.  He checks LFTs annually.  ALT/AST were in the 90's in 03/06/13, then improved to 40's late 03/24/13 after she reduced her alcohol consumption from 4 days per week down to 2 nights per week (2-3 glasses per day).     Diet:  Patient trying to follow a Mediterranean Diet currently.  She admits to eating too much cheese and a high salt diet as well.  She eats out rarely, never fries foods, and eats little red meat.  She has 1-2 diet sodas per week.  Doesn't eat lots of desserts.  Drinks 4-6 glasses of wine per week since early 03/2013 (down from 10-12 glasses per week).  Exercise:   Patient doesn't do any regular/scheduled aerobic activity, however she does work on a farm and stays active.  She has been able to lose weight in past with regular exercise.  Patient asked lots of questions about statins, bile acid sequestrants, Zetia, niacin, advanced lipid testing, and emerging markers.  She wanted to know what would be appropriate for her at this time.  She wanted to make sure she is being proactive given her family history.  Labs:  TC 274, TG 135, HDL 60, LDL191, Glucose 99, ALT 47, AST 34 (03/24/2013) - not on lipid lowering therapy Lowest LDL was back in 03/2010 when she lost 30 lbs and eating a high fiber / low fat diet.  Was not on lipid lowering meds then either.  Current Outpatient Prescriptions  Medication Sig Dispense Refill  . amLODipine (NORVASC) 10 MG tablet Take 1 tablet (10 mg total) by mouth daily.  30 tablet  3  . aspirin EC 81 MG tablet Take 81 mg by mouth 2 (two) times daily.      . calcium carbonate (OS-CAL - DOSED IN MG OF ELEMENTAL CALCIUM) 1250 MG tablet Take 1,250 mg by mouth daily.       Marland Kitchen lisinopril (PRINIVIL,ZESTRIL) 40 MG tablet Take 1 tablet (40 mg total) by mouth daily.  30 tablet  3  . metoprolol succinate (TOPROL-XL) 100 MG 24 hr tablet Take 100 mg by mouth daily. Take with or immediately following a meal.      . omeprazole (PRILOSEC) 20 MG capsule Take 20 mg by mouth daily.      . pravastatin (PRAVACHOL) 20 MG tablet Take 1 tablet (20 mg total) by mouth every evening.  30 tablet  11  . venlafaxine XR (EFFEXOR-XR) 150 MG 24 hr capsule Take 150 mg by mouth daily.       No current facility-administered medications for this visit.   No Known Allergies  Family History  Problem Relation Age of Onset  . Diabetes Mother   . Heart failure Mother   . Stroke Father   . Heart disease Brother

## 2013-04-14 NOTE — Assessment & Plan Note (Addendum)
Given her family history, LDL of 190 mg/dL, and need for at least 35% LDL reduction, we will initiate lipid lowering therapy today.  Due to her h/o elevated LFTs (which seem to be exacerbated by alcohol use), we will be conservative on our treatment initially, and then check an Lp(a) and NMR in 3 months to see if more aggressive therapy is needed.  We discussed pros and cons of statin vs BAS vs Zetia vs Niacin, and she felt comfortable moving forward with low dose pravastatin (20 mg).  Given she lowered cholesterol significantly in the past with high fiber, we will start viscous fiber two to three times daily (Metamucil) as well.  This will hopefully also help with  weight reduction as well.  We discussed dietary changes / alcohol use as well.  Also discussed the need to restart exercise in hopes of losing weight for both liver and metabolic reasons.  We discussed the pathophysiology of diabetes as well as she is concerned given her mother's history of this, and statin use.   Will have an NMR and Lp(a) checked in 3 months to help further risk stratify and assess if we need more aggressive treatment.  If Lp(a) is elevated, then would want to see LDL-P < 1000, otherwise would like LDL-P to at least be < 1300 given family history. Plan: 1.  Start pravastatin 20 mg daily - take in the evening. 2.  Take Metamucil 2-3 times per day (1 tablespoon powder mixed in full glass of water/juice) 3.  Limit red meat and cheese.  Continue mediterranean diet. 4.  Walk 5 days per week - 30-40 minutes per day. 5.  Limit wine to no more than 4 glasses per week at the most. 6.  Check liver enzymes in 4 weeks (12/8) given her history and starting pravastatin today.   7.  Check cholesterol and liver 3 months (1/26) and see Jamieson Lisa again on 07/09/12.

## 2013-04-15 MED ORDER — PSYLLIUM 28 % PO PACK: 1.0000 | PACK | Freq: Three times a day (TID) | ORAL | Status: DC

## 2013-04-15 NOTE — Addendum Note (Signed)
Addended by: Bishop Limbo on: 04/15/2013 08:19 AM   Modules accepted: Orders

## 2013-04-17 ENCOUNTER — Other Ambulatory Visit: Payer: BC Managed Care – PPO

## 2013-05-12 ENCOUNTER — Other Ambulatory Visit: Payer: BC Managed Care – PPO

## 2013-06-20 ENCOUNTER — Telehealth: Payer: Self-pay | Admitting: Cardiology

## 2013-06-20 NOTE — Telephone Encounter (Signed)
**Note De-Identified Christy Freeman Obfuscation** Based on the pts description of the test that she is referring to, it is Cardiac Calcium Scoring. She states that she is going to discuss with her husband as she is aware that her insurance will not cover the cost and call us back if she decides to have the cardiac calcium scoring.

## 2013-06-20 NOTE — Telephone Encounter (Signed)
New Prob    Pt has some questions regarding a cardio scan. Please call.

## 2013-06-30 ENCOUNTER — Other Ambulatory Visit: Payer: BC Managed Care – PPO

## 2013-07-09 ENCOUNTER — Ambulatory Visit: Payer: BC Managed Care – PPO | Admitting: Pharmacist

## 2013-07-14 ENCOUNTER — Other Ambulatory Visit: Payer: Self-pay | Admitting: Cardiology

## 2013-09-02 ENCOUNTER — Telehealth: Payer: Self-pay | Admitting: Cardiology

## 2013-09-02 DIAGNOSIS — E785 Hyperlipidemia, unspecified: Secondary | ICD-10-CM

## 2013-09-02 NOTE — Telephone Encounter (Signed)
I will forward to Dr Nelson/ cost of a calcium scoring is out of pocket/ $150.00, to see if she will order.

## 2013-09-02 NOTE — Telephone Encounter (Signed)
New message     Pt want to have a CT (calcium score something) because she has high cholesterol and want to see how much buildup she has in her arteries.  Pt sees Ysidro Evert in the lipid clinic and do not want to take statins unless necessary.  She want it even if ins does not pay.

## 2013-09-03 NOTE — Telephone Encounter (Signed)
I think it is an ideal test for a patient like this one. Please order I will sign the order and read her study once is done. Thank you! Ena Dawley

## 2013-09-04 NOTE — Telephone Encounter (Addendum)
I left a detailed message on the pts VM stating that Dr Meda Coffee agrees with her having a Calcium score and that someone from this office will contact her to schedule. Message sent to [facility] to arrange.

## 2013-09-04 NOTE — Telephone Encounter (Signed)
i will forward to nurse.

## 2013-09-11 ENCOUNTER — Ambulatory Visit (INDEPENDENT_AMBULATORY_CARE_PROVIDER_SITE_OTHER)
Admission: RE | Admit: 2013-09-11 | Discharge: 2013-09-11 | Disposition: A | Discharge status: Home / Self Care | Payer: Self-pay | Source: Ambulatory Visit | Attending: Cardiology | Admitting: Cardiology

## 2013-09-11 ENCOUNTER — Telehealth: Payer: Self-pay | Admitting: *Deleted

## 2013-09-11 DIAGNOSIS — E785 Hyperlipidemia, unspecified: Secondary | ICD-10-CM

## 2013-09-11 NOTE — Telephone Encounter (Signed)
Received call from Opal Sidles at [facility] with report of CT Cardiac Scoring.  Per Opal Sidles report indicates 8 mm lingular nodule. If the patient is at high risk for bronchogenic carcinoma, follow-up chest CT at 3-66month is recommended. If the patient is at low risk for bronchogenic carcinoma, follow-up chest CT at 6-12 months is recommended.  This report is in EPIC and on Dr. NFrancesca Omandesktop for review. Will also route this phone note to Dr. NMeda Coffee

## 2013-09-15 NOTE — Telephone Encounter (Signed)
I tried to call this patient but couldn't reach her. Could you let her know that her calcium score is normal? Also they found a very small nodule (1.8 mm) on her lungs, it only means that we have to repeat her CT chest in 6 month to make sure its not growing. Could you order it? Thank you, Houston Siren

## 2013-09-16 NOTE — Telephone Encounter (Signed)
Called Pt with Chest CT results given from Dr. Meda Coffee. Pt was very concerned about waiting 6 months for the next CT scan. She would like her next scan to be in 3 months. Will notify Dr. Meda Coffee of Pts wishes. Pt would like for CT scan to be done at [facility].

## 2013-09-16 NOTE — Telephone Encounter (Signed)
Without contrast, thank you

## 2013-09-17 ENCOUNTER — Other Ambulatory Visit: Payer: Self-pay | Admitting: *Deleted

## 2013-09-17 DIAGNOSIS — R911 Solitary pulmonary nodule: Secondary | ICD-10-CM

## 2013-09-17 NOTE — Telephone Encounter (Signed)
Spoke with pt in regards to wanting her CT Chest scan done in 3 months instead of 6  Months. Dr Meda Coffee did ok this to be done in 3 months. Dr Meda Coffee also asked for me to ask if Pt is a smoker or not. Pt states she has never smoked. Pt also requesting to switch her Cardiologist to Dr. Rockey Situ at [facility] office due to shorter drive time for her because she resides in [place]. I will make Dr Meda Coffee aware of this. Also placed an order in EPIC for chest ct in 3 months at [place] (loc requested by pt).  Sent [facility] pool message about this.  Pt verbalized understanding.

## 2013-09-19 ENCOUNTER — Encounter: Payer: Self-pay | Admitting: Cardiovascular Disease

## 2013-09-19 ENCOUNTER — Ambulatory Visit (INDEPENDENT_AMBULATORY_CARE_PROVIDER_SITE_OTHER): Payer: BC Managed Care – PPO | Admitting: Cardiovascular Disease

## 2013-09-19 ENCOUNTER — Other Ambulatory Visit: Payer: Self-pay

## 2013-09-19 ENCOUNTER — Encounter (INDEPENDENT_AMBULATORY_CARE_PROVIDER_SITE_OTHER): Payer: Self-pay

## 2013-09-19 VITALS — BP 142/88 | HR 76 | Ht 64.0 in | Wt 187.2 lb

## 2013-09-19 DIAGNOSIS — K7581 Nonalcoholic steatohepatitis (NASH): Secondary | ICD-10-CM | POA: Insufficient documentation

## 2013-09-19 DIAGNOSIS — R079 Chest pain, unspecified: Secondary | ICD-10-CM

## 2013-09-19 DIAGNOSIS — R911 Solitary pulmonary nodule: Secondary | ICD-10-CM

## 2013-09-19 DIAGNOSIS — E669 Obesity, unspecified: Secondary | ICD-10-CM

## 2013-09-19 DIAGNOSIS — K7689 Other specified diseases of liver: Secondary | ICD-10-CM

## 2013-09-19 DIAGNOSIS — I10 Essential (primary) hypertension: Secondary | ICD-10-CM

## 2013-09-19 DIAGNOSIS — E785 Hyperlipidemia, unspecified: Secondary | ICD-10-CM

## 2013-09-19 DIAGNOSIS — Z6832 Body mass index (BMI) 32.0-32.9, adult: Secondary | ICD-10-CM | POA: Insufficient documentation

## 2013-09-19 MED ORDER — ROSUVASTATIN CALCIUM 10 MG PO TABS: 10.0000 mg | ORAL_TABLET | Freq: Every day | ORAL | Status: DC

## 2013-09-19 NOTE — Assessment & Plan Note (Signed)
We spent most of the visit discussing her cholesterol and calcium score. She would like to bring her cholesterol down to avoid some of the stress of her elevated numbers and avoid long-term risk of CAD, Pad. She is requesting Crestor and we will start 5 mg with slow titration up to 10 mg. Repeat cholesterol in several months time with LFTs

## 2013-09-19 NOTE — Progress Notes (Signed)
Patient ID: Christy Freeman, female    DOB: [birth year], 63 y.o.   MRN: [id number]  HPI Comments:  63 y.o. Female with h/o fatty liver and  LDL b/t 190-200 mg/dL,  Previous evaluation in the ER following chest pains, which were ultimately felt to be due to noncardiac chest pain, poorly controlled blood pressure,   ACS was ruled out.  Stress test negative.  She had lowered her LDL to 141 mg/dL in the past with high fiber / low fat diet, exercise, and 30 lbs weight loss.  She has regained all of this weight back   LDL back up to 191 mg/dL now.  She presents for evaluation of hyperlipidemia, her calcium score  She reports that one of her biggest concerns is the nodule seen in her lung. She has a 36m nodule seen in the lingula. Calcium score was 0. Recommendation was for repeat CT scan in 6-12 months. She is low risk with no prior smoking history.  [place]. does not do regular exercise program. She had problems with her cholesterol for most of her life She does report significant family history is detailed below The patient has risk factors; HTN,hyperlipidemia  Family history:  Mother (MI at 528y.o. ; Diabetes at age 63y.o.).  Father (TIA at 73y.o.);  Brother (MI at 572y.o.) Social History:  Drinks wine 4-6 glasses per week  Previously drank 4 nights per week.  She stopped 2009-2011 after seeing fatty liver results, but restarted in 2011 after stress levels increased.   No tobacco use.    Liver history:  Patient diagnosed with fatty liver on U/S 06/2007 by Dr. LGaye Pollack([facility].  Her LFTs went all the way to normal she tells me once she stopped drinking alcohol in 2009 - then went up after restating alcohol.  He checks LFTs annually.  ALT/AST were in the 90's in 03/06/13, then improved to 40's late 03/24/13 after she reduced her alcohol consumption from 4 days per week down to 2 nights per week (2-3 glasses per day).     Labs:  TC 274, TG 135, HDL 60, LDL191, Glucose 99, ALT 47, AST  34 (03/24/2013) -  EKG shows normal sinus rhythm with rate 76 beats per minute, no significant ST or T wave changes   Outpatient Encounter Prescriptions as of 09/19/2013  Medication Sig  . amLODipine (NORVASC) 10 MG tablet take 1 tablet by mouth once daily  . aspirin EC 81 MG tablet Take 81 mg by mouth 2 (two) times daily.  . calcium carbonate (OS-CAL - DOSED IN MG OF ELEMENTAL CALCIUM) 1250 MG tablet Take 1,250 mg by mouth daily.   .Marland Kitchenconjugated estrogens (PREMARIN) vaginal cream Place 1 Applicatorful vaginally daily.  .Marland Kitchenketoconazole (NIZORAL) 2 % shampoo Apply 1 application topically 2 (two) times a week.   .Marland Kitchenlisinopril (PRINIVIL,ZESTRIL) 40 MG tablet take 1 tablet by mouth once daily  . metoprolol succinate (TOPROL-XL) 100 MG 24 hr tablet Take 100 mg by mouth daily. Take with or immediately following a meal.  . omeprazole (PRILOSEC) 20 MG capsule Take 20 mg by mouth daily.  . psyllium (METAMUCIL SMOOTH TEXTURE) 28 % packet Take 1 packet by mouth 3 (three) times daily between meals.  .Marland Kitchenterconazole (TERAZOL 3) 0.8 % vaginal cream Place 1 applicator vaginally at bedtime.   .Marland Kitchenvenlafaxine XR (EFFEXOR-XR) 150 MG 24 hr capsule Take 150 mg by mouth daily.  . rosuvastatin (CRESTOR) 10 MG tablet  Take 1 tablet (10 mg total) by mouth daily.    Review of Systems  Constitutional: Negative.   HENT: Negative.   Eyes: Negative.   Respiratory: Negative.   Cardiovascular: Negative.   Gastrointestinal: Negative.   Endocrine: Negative.   Musculoskeletal: Negative.   Skin: Negative.   Allergic/Immunologic: Negative.   Neurological: Negative.   Hematological: Negative.   Psychiatric/Behavioral: Negative.   All other systems reviewed and are negative.   BP 142/88  Pulse 76  Ht 5' 4"  (1.626 m)  Wt 187 lb 4 oz (84.936 kg)  BMI 32.13 kg/m2   Physical Exam  Nursing note and vitals reviewed. Constitutional: She is oriented to person, place, and time. She appears well-developed and well-nourished.   obese  HENT:  Head: Normocephalic.  Nose: Nose normal.  Mouth/Throat: Oropharynx is clear and moist.  Eyes: Conjunctivae are normal. Pupils are equal, round, and reactive to light.  Neck: Normal range of motion. Neck supple. No JVD present.  Cardiovascular: Normal rate, regular rhythm, S1 normal, S2 normal, normal heart sounds and intact distal pulses.  Exam reveals no gallop and no friction rub.   No murmur heard. Pulmonary/Chest: Effort normal and breath sounds normal. No respiratory distress. She has no wheezes. She has no rales. She exhibits no tenderness.  Abdominal: Soft. Bowel sounds are normal. She exhibits no distension. There is no tenderness.  Musculoskeletal: Normal range of motion. She exhibits no edema and no tenderness.  Lymphadenopathy:    She has no cervical adenopathy.  Neurological: She is alert and oriented to person, place, and time. Coordination normal.  Skin: Skin is warm and dry. No rash noted. No erythema.  Psychiatric: She has a normal mood and affect. Her behavior is normal. Judgment and thought content normal.    Assessment and Plan

## 2013-09-19 NOTE — Assessment & Plan Note (Signed)
Blood pressure is well controlled on today's visit. No changes made to the medications. 

## 2013-09-19 NOTE — Assessment & Plan Note (Signed)
We have recommended a regular exercise program for weight loss. Recommended a strict diet.

## 2013-09-19 NOTE — Patient Instructions (Signed)
You are doing well. Please start crestor 5 mg daily   Lab work in 3 to 4 months  Please call us if you have new issues that need to be addressed before your next appt.  Your physician wants you to follow-up in: 12 months.  You will receive a reminder letter in the mail two months in advance. If you don't receive a letter, please call our office to schedule the follow-up appointment.

## 2013-09-19 NOTE — Assessment & Plan Note (Signed)
Recommended she minimize her alcohol intake. Stressed importance of lower weight given her history of elevated LFTs and fatty liver.

## 2013-09-19 NOTE — Assessment & Plan Note (Signed)
No further episodes of chest pain. With a calcium score of 0, likely noncardiac chest pain

## 2013-09-22 ENCOUNTER — Telehealth: Payer: Self-pay | Admitting: Cardiology

## 2013-09-22 ENCOUNTER — Encounter: Payer: Self-pay | Admitting: Cardiovascular Disease

## 2013-09-22 DIAGNOSIS — R911 Solitary pulmonary nodule: Secondary | ICD-10-CM

## 2013-09-22 NOTE — Telephone Encounter (Signed)
x

## 2013-09-26 NOTE — Telephone Encounter (Signed)
Spoke w/ pt.  Advised her that Dr. Rockey Situ and I had read her email and neither of [place] understood what she was asking for.  She states that she was limited on the amount of characters she could use.  She states that she would like a chest ct for her lung nodule and was suggesting diagnoses in order to get this approved thru her ins. Advised her that Dr. Rockey Situ recommends she see a pulmonologist, as this is not a cardiac issue.  She is agreeable to this and will wait for a call back w/ appt time.

## 2013-09-26 NOTE — Telephone Encounter (Signed)
Pt sched to see Dr. Lake Bells at [facility] 10/13/13 @ 10:45. Pt aware and will call w/ further questions or concerns.

## 2013-10-13 ENCOUNTER — Ambulatory Visit (INDEPENDENT_AMBULATORY_CARE_PROVIDER_SITE_OTHER): Payer: BC Managed Care – PPO | Admitting: Pulmonary Disease

## 2013-10-13 ENCOUNTER — Encounter: Payer: Self-pay | Admitting: Pulmonary Disease

## 2013-10-13 ENCOUNTER — Ambulatory Visit (INDEPENDENT_AMBULATORY_CARE_PROVIDER_SITE_OTHER)
Admission: RE | Admit: 2013-10-13 | Discharge: 2013-10-13 | Disposition: A | Discharge status: Home / Self Care | Payer: BC Managed Care – PPO | Source: Ambulatory Visit | Attending: Pulmonary Disease | Admitting: Pulmonary Disease

## 2013-10-13 ENCOUNTER — Other Ambulatory Visit: Payer: Self-pay

## 2013-10-13 VITALS — BP 142/86 | HR 79 | Ht 63.5 in | Wt 186.0 lb

## 2013-10-13 DIAGNOSIS — R05 Cough: Secondary | ICD-10-CM

## 2013-10-13 DIAGNOSIS — R911 Solitary pulmonary nodule: Secondary | ICD-10-CM | POA: Insufficient documentation

## 2013-10-13 DIAGNOSIS — R059 Cough, unspecified: Secondary | ICD-10-CM | POA: Insufficient documentation

## 2013-10-13 MED ORDER — ROSUVASTATIN CALCIUM 5 MG PO TABS: 5.0000 mg | ORAL_TABLET | ORAL | Status: DC

## 2013-10-13 NOTE — Assessment & Plan Note (Signed)
Encouraged by the fact that this nodule is solid, round, and less than 1 cm in size.  However, I share Jennavie's concern for the fact that she may be at increased risk for lung malignancy. 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 Further, she stated that when she first started her career over 35 years ago the safety requirements were much more lax than they were towards the end of her career.  Plan: -Repeat CT chest at 3 months (ordered for July 2015 -Followup with me after that study

## 2013-10-13 NOTE — Assessment & Plan Note (Signed)
She has had recurrent cough off and on over the years. It sounds like recurrent URIs. The most recent episode a few weeks ago.  Plan: -Obtain chest x-ray to look at the remainder of her lung tissue (CT scan was limited)

## 2013-10-13 NOTE — Progress Notes (Signed)
Subjective:    Patient ID: Christy Freeman, female    DOB: 03-20-1951, 63 y.o.   MRN: [id number]  HPI  This is a very pleasant 63 year old female who only smoked a few cigarettes in college who comes to my clinic today for evaluation of a pulmonary nodule. This was found as part of a CT scan protocol for coronary artery calcium. So study was a limited study and did not show the complete lung fields. She was found to have a 9 mm solid nodule in the left upper lobe in a subpleural distribution. She had never been told in the past that she had an abnormal chest x-ray. She has no personal history of malignancy other than a basal cell carcinoma. She has no family history of lung cancer.  She has had some chest tightness which she attributed to acid reflux. This is resolved over a month ago. She has not had weight loss, hemoptysis, or unexplained fevers or chills.  She is quite anxious about the CT scan and has many questions about today.  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 These included benzene's and formaldehyde to name a few. She said that when she first started working more than 35 years ago the safety restrictions were quite lax compared to current restrictions.  Past Medical History  Diagnosis Date  . Hypertension   . Hypercholesterolemia     diet controlled  . GERD (gastroesophageal reflux disease)   . H/O: depression   . Lung nodule     left lung      Family History  Problem Relation Age of Onset  . Diabetes Mother   . Heart failure Mother   . Stroke Father   . Heart disease Brother      History   Social History  . Marital Status: Married    Spouse Name: N/A    Number of Children: N/A  . Years of Education: N/A   Occupational History  . Not on file.   Social History Main Topics  . Smoking status: Never Smoker   . Smokeless tobacco: Never Used  .  Alcohol Use: 4.0 oz/week    8 drink(s) per week  . Drug Use: No  . Sexual Activity: Not on file   Other Topics Concern  . Not on file   Social History Narrative  . No narrative on file     No Known Allergies   Outpatient Prescriptions Prior to Visit  Medication Sig Dispense Refill  . amLODipine (NORVASC) 10 MG tablet take 1 tablet by mouth once daily  30 tablet  3  . aspirin EC 81 MG tablet Take 81 mg by mouth 2 (two) times daily.      . calcium carbonate (OS-CAL - DOSED IN MG OF ELEMENTAL CALCIUM) 1250 MG tablet Take 1,250 mg by mouth daily.       Marland Kitchen conjugated estrogens (PREMARIN) vaginal cream Place 1 Applicatorful vaginally once a week.       Marland Kitchen lisinopril (PRINIVIL,ZESTRIL) 40 MG tablet take 1 tablet by mouth once daily  30 tablet  3  . metoprolol succinate (TOPROL-XL) 100 MG 24 hr tablet Take 100 mg by mouth daily. Take with or immediately following a meal.      . omeprazole (PRILOSEC) 20 MG capsule Take 20 mg by mouth daily.      Marland Kitchen venlafaxine XR (EFFEXOR-XR) 150 MG 24 hr capsule Take  150 mg by mouth daily.      . rosuvastatin (CRESTOR) 10 MG tablet Take 1 tablet (10 mg total) by mouth daily.  30 tablet  3  . ketoconazole (NIZORAL) 2 % shampoo Apply 1 application topically 2 (two) times a week.       . psyllium (METAMUCIL SMOOTH TEXTURE) 28 % packet Take 1 packet by mouth 3 (three) times daily between meals.      Marland Kitchen terconazole (TERAZOL 3) 0.8 % vaginal cream Place 1 applicator vaginally at bedtime.        No facility-administered medications prior to visit.       Review of Systems  Constitutional: Negative for fever and unexpected weight change.  HENT: Positive for congestion and dental problem. Negative for ear pain, nosebleeds, postnasal drip, rhinorrhea, sinus pressure, sneezing, sore throat and trouble swallowing.   Eyes: Negative for redness and itching.  Respiratory: Positive for cough and shortness of breath. Negative for chest tightness and wheezing.    Cardiovascular: Negative for palpitations and leg swelling.  Gastrointestinal: Negative for nausea and vomiting.  Genitourinary: Negative for dysuria.  Musculoskeletal: Negative for joint swelling.  Skin: Positive for rash.  Neurological: Negative for headaches.  Hematological: Does not bruise/bleed easily.  Psychiatric/Behavioral: Negative for dysphoric mood. The patient is not nervous/anxious.        Objective:   Physical Exam Filed Vitals:   10/13/13 1117  BP: 142/86  Pulse: 79  Height: 5' 3.5" (1.613 m)  Weight: 186 lb (84.369 kg)  SpO2: 97%   Gen: well appearing, no acute distress HEENT: NCAT, PERRL, EOMi, OP clear, neck supple without masses PULM: CTA B CV: RRR, no mgr, no JVD AB: BS+, soft, nontender, no hsm Ext: warm, no edema, no clubbing, no cyanosis Derm: no rash or skin breakdown Neuro: A&Ox4, CN II-XII intact, strength 5/5 in all 4 extremities  09/11/2013 CT chest (cardiac calcium score protocol) . Limited views of the lungs are available. The lung tissue is normal the exception of a 9 mm nodule in the left upper lobe. This is subpleural, round, and homogenous in texture     Assessment & Plan:   Solitary pulmonary nodule Encouraged by the fact that this nodule is solid, round, and less than 1 cm in size.  However, I share Christy Freeman's concern for the fact that she may be at increased risk for lung malignancy. 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 Further, she stated that when she first started her career over 35 years ago the safety requirements were much more lax than they were towards the end of her career.  Plan: -Repeat CT chest at 3 months (ordered for July 2015 -Followup with me after that study  Cough She has had recurrent cough off and on over the years. It sounds like recurrent URIs. The most  recent episode a few weeks ago.  Plan: -Obtain chest x-ray to look at the remainder of her lung tissue (CT scan was limited)   Updated Medication List Outpatient Encounter Prescriptions as of 10/13/2013  Medication Sig  . amLODipine (NORVASC) 10 MG tablet take 1 tablet by mouth once daily  . aspirin EC 81 MG tablet Take 81 mg by mouth 2 (two) times daily.  . calcium carbonate (OS-CAL - DOSED IN MG OF ELEMENTAL CALCIUM) 1250 MG tablet Take 1,250 mg by mouth daily.   Marland Kitchen  conjugated estrogens (PREMARIN) vaginal cream Place 1 Applicatorful vaginally once a week.   Marland Kitchen lisinopril (PRINIVIL,ZESTRIL) 40 MG tablet take 1 tablet by mouth once daily  . metoprolol succinate (TOPROL-XL) 100 MG 24 hr tablet Take 100 mg by mouth daily. Take with or immediately following a meal.  . omeprazole (PRILOSEC) 20 MG capsule Take 20 mg by mouth daily.  . rosuvastatin (CRESTOR) 10 MG tablet .5 tablet every other day  . venlafaxine XR (EFFEXOR-XR) 150 MG 24 hr capsule Take 150 mg by mouth daily.  . [DISCONTINUED] rosuvastatin (CRESTOR) 10 MG tablet Take 1 tablet (10 mg total) by mouth daily.  . [DISCONTINUED] ketoconazole (NIZORAL) 2 % shampoo Apply 1 application topically 2 (two) times a week.   . [DISCONTINUED] psyllium (METAMUCIL SMOOTH TEXTURE) 28 % packet Take 1 packet by mouth 3 (three) times daily between meals.  . [DISCONTINUED] terconazole (TERAZOL 3) 0.8 % vaginal cream Place 1 applicator vaginally at bedtime.

## 2013-10-13 NOTE — Progress Notes (Signed)
Quick Note:  Spoke with pt, she is aware of results and recs. Nothing further needed at this time. ______

## 2013-10-13 NOTE — Patient Instructions (Signed)
Go get a Chest X-ray at the [facility] We will arrange a CT chest in July in [place] We will see you back in this clinic in July after the CT scan

## 2013-10-13 NOTE — Telephone Encounter (Signed)
Pt needs 90 day

## 2013-11-07 ENCOUNTER — Other Ambulatory Visit: Payer: Self-pay | Admitting: Cardiology

## 2013-12-15 ENCOUNTER — Ambulatory Visit (INDEPENDENT_AMBULATORY_CARE_PROVIDER_SITE_OTHER)
Admission: RE | Admit: 2013-12-15 | Discharge: 2013-12-15 | Disposition: A | Discharge status: Home / Self Care | Payer: BC Managed Care – PPO | Source: Ambulatory Visit | Attending: Pulmonary Disease | Admitting: Pulmonary Disease

## 2013-12-15 ENCOUNTER — Encounter: Payer: Self-pay | Admitting: Pulmonary Disease

## 2013-12-15 DIAGNOSIS — R911 Solitary pulmonary nodule: Secondary | ICD-10-CM

## 2013-12-16 ENCOUNTER — Telehealth: Payer: Self-pay | Admitting: Pulmonary Disease

## 2013-12-16 NOTE — Telephone Encounter (Signed)
Notes Recorded by Juanito Doom, MD on 12/15/2013 at 3:06 PM A,  Please let her know that the CT chest showed that the nodule had decreased in size which is great. We will discuss the timing of the next CT on the net visit. ---  I spoke with patient about results and she verbalized understanding and had no questions

## 2014-04-03 ENCOUNTER — Telehealth: Payer: Self-pay | Admitting: *Deleted

## 2014-04-03 NOTE — Telephone Encounter (Signed)
Spoke w/ pt.  Advised her that she can come in for labs at her convenience.  She is coming for lipid panel 04/09/14.

## 2014-04-03 NOTE — Telephone Encounter (Signed)
Patient called wanting to know if she needs labs since starting Crestor? Please call patient.

## 2014-04-07 ENCOUNTER — Encounter: Payer: Self-pay | Admitting: Pulmonary Disease

## 2014-04-09 ENCOUNTER — Other Ambulatory Visit (INDEPENDENT_AMBULATORY_CARE_PROVIDER_SITE_OTHER): Payer: BC Managed Care – PPO

## 2014-04-09 DIAGNOSIS — R079 Chest pain, unspecified: Secondary | ICD-10-CM

## 2014-04-09 DIAGNOSIS — E785 Hyperlipidemia, unspecified: Secondary | ICD-10-CM

## 2014-04-09 DIAGNOSIS — I10 Essential (primary) hypertension: Secondary | ICD-10-CM

## 2014-04-10 LAB — LIPID PANEL
Chol/HDL Ratio: 3.8 ratio units (ref 0.0–4.4)
Cholesterol, Total: 216 mg/dL — ABNORMAL HIGH (ref 100–199)
HDL: 57 mg/dL (ref >39)
LDL Calculated: 131 mg/dL — ABNORMAL HIGH (ref 0–99)
TRIGLYCERIDES: 141 mg/dL (ref 0–149)
VLDL Cholesterol Cal: 28 mg/dL (ref 5–40)

## 2014-04-10 LAB — HEPATIC FUNCTION PANEL
ALK PHOS: 140 IU/L — AB (ref 39–117)
ALT: 86 IU/L — AB (ref 0–32)
AST: 98 IU/L — ABNORMAL HIGH (ref 0–40)
Albumin: 4.6 g/dL (ref 3.6–4.8)
BILIRUBIN DIRECT: 0.11 mg/dL (ref 0.00–0.40)
Total Bilirubin: 0.4 mg/dL (ref 0.0–1.2)
Total Protein: 7.7 g/dL (ref 6.0–8.5)

## 2014-04-15 LAB — HM MAMMOGRAPHY: HM Mammogram: NEGATIVE

## 2014-04-22 ENCOUNTER — Ambulatory Visit: Payer: BC Managed Care – PPO | Admitting: Pulmonary Disease

## 2014-05-07 ENCOUNTER — Ambulatory Visit: Payer: BC Managed Care – PPO | Admitting: Family Medicine

## 2014-05-11 ENCOUNTER — Ambulatory Visit: Payer: BC Managed Care – PPO | Admitting: Family Medicine

## 2014-05-12 ENCOUNTER — Ambulatory Visit (INDEPENDENT_AMBULATORY_CARE_PROVIDER_SITE_OTHER): Payer: BC Managed Care – PPO | Admitting: Family Medicine

## 2014-05-12 ENCOUNTER — Encounter: Payer: Self-pay | Admitting: Family Medicine

## 2014-05-12 ENCOUNTER — Telehealth: Payer: Self-pay | Admitting: Family Medicine

## 2014-05-12 VITALS — BP 146/90 | HR 82 | Temp 98.5°F | Ht 64.0 in | Wt 184.8 lb

## 2014-05-12 DIAGNOSIS — R21 Rash and other nonspecific skin eruption: Secondary | ICD-10-CM | POA: Insufficient documentation

## 2014-05-12 DIAGNOSIS — F4321 Adjustment disorder with depressed mood: Secondary | ICD-10-CM

## 2014-05-12 DIAGNOSIS — I10 Essential (primary) hypertension: Secondary | ICD-10-CM

## 2014-05-12 DIAGNOSIS — Z79890 Hormone replacement therapy (postmenopausal): Secondary | ICD-10-CM

## 2014-05-12 DIAGNOSIS — Z23 Encounter for immunization: Secondary | ICD-10-CM

## 2014-05-12 MED ORDER — AMLODIPINE-OLMESARTAN 5-40 MG PO TABS: 1.0000 | ORAL_TABLET | Freq: Every day | ORAL | Status: DC

## 2014-05-12 NOTE — Patient Instructions (Signed)
It was nice to meet you. Let's change you lisinopril/amlodipine to Azor 5- 40 mg daily. Please keep a log of your blood pressure and symptoms.  Please come see me in January for your physical.

## 2014-05-12 NOTE — Telephone Encounter (Signed)
Pt called to return your call. Requests call back when available.

## 2014-05-12 NOTE — Assessment & Plan Note (Signed)
  Discussed risks and benefits of HRT.  Pt has a uterus and needs progesterone with estrogen to prevent endometrial hyperplasia (20-50% of patients on unopposed estrogen for 1 yr will develop hyperplasia). She has a GYN.  Will await records and advise her to contact them or allow me to add progesterone.

## 2014-05-12 NOTE — Assessment & Plan Note (Signed)
New- see above. Etiology unclear at this point but perhaps it is related to higher dose of amlodipine. Call or return to clinic prn if these symptoms worsen or fail to improve as anticipated. The patient indicates understanding of these issues and agrees with the plan.

## 2014-05-12 NOTE — Progress Notes (Signed)
Subjective:   Patient ID: Christy Freeman, female    DOB: [birth year], 63 y.o.   MRN: [id number]  Christy Freeman is a pleasant 63 y.o. 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  BP had been controlled previously on Azor 5-40 daily and Toprol 100 mg daily.  MD at the time thought they should break up components of Azor so they could titrate up doses better. Now taking Toprol XL 100 mg daily, Lisinopril 40 mg daily and amlodipine 10 mg daily. Since starting this, she has intermittent flushing and rash of her LE and knees- usually after drinking wine or taking a hot shower. Does not itch- sometimes feels raised. Not painful.  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  Also feels current dose of Effexor is working well. Does still get tearful but feels her mood is improving. Sleeping ok. No SI or HI.  Does have a GYN- appears she is on unopposed estrogen and does have a uterus.  No h/o post menopausal bleeding.  HLD- taking crestor every other day.  Current Outpatient Prescriptions on File Prior to Visit  Medication Sig Dispense Refill  . aspirin EC 81 MG tablet Take 81 mg by mouth 2 (two) times daily.    . calcium carbonate (OS-CAL - DOSED IN MG OF ELEMENTAL CALCIUM) 1250 MG tablet Take 1,250 mg by mouth daily.     Marland Kitchen conjugated estrogens (PREMARIN) vaginal cream Place 1 Applicatorful vaginally once a week.     . metoprolol succinate (TOPROL-XL) 100 MG 24 hr tablet Take 100 mg by mouth daily. Take with or immediately following a meal.    . omeprazole (PRILOSEC) 20 MG capsule Take 20 mg by mouth daily.    . rosuvastatin (CRESTOR) 5 MG tablet Take 1 tablet (5 mg total) by mouth every other day. (Patient taking differently: Take 5 mg by mouth every other day. Take 1/2 tab every other day) 30 tablet 3  .  venlafaxine XR (EFFEXOR-XR) 150 MG 24 hr capsule Take 150 mg by mouth daily.     No current facility-administered medications on file prior to visit.    No Known Allergies  Past Medical History  Diagnosis Date  . Hypertension   . Hypercholesterolemia     diet controlled  . GERD (gastroesophageal reflux disease)   . H/O: depression   . Lung nodule     left lung   . Alcohol abuse   . Cancer     basil cell on face  . Thyroid disease   . Blood transfusion without reported diagnosis   . Colon polyps     Past Surgical History  Procedure Laterality Date  . Cyst removal hand Left   . Dilation and curettage of uterus      Family History  Problem Relation Age of Onset  . Diabetes Mother   . Heart failure Mother   . Hypertension Mother   . Stroke Father   . Hypertension Father   . Heart disease Brother   . Stroke Maternal Grandmother     History   Social History  . Marital Status: Married    Spouse Name: N/A    Number of Children: N/A  . Years of Education: N/A   Occupational History  . Not on file.   Social History Main Topics  .  Smoking status: Never Smoker   . Smokeless tobacco: Never Used  . Alcohol Use: 4.0 oz/week    8 Not specified per week  . Drug Use: No  . Sexual Activity: Yes   Other Topics Concern  . Not on file   Social History Narrative   The PMH, PSH, Social History, Family History, Medications, and allergies have been reviewed in [facility], and have been updated if relevant.    Review of Systems  Constitutional: Negative.   HENT: Negative.   Eyes: Negative.   Respiratory: Negative.   Cardiovascular: Negative.   Gastrointestinal: Negative.   Genitourinary: Negative.   Musculoskeletal: Negative.   Skin: Positive for rash.  Neurological: Negative.   Hematological: Negative.   Psychiatric/Behavioral: Negative.   All other systems reviewed and are negative.      Objective:    BP 146/90 mmHg  Pulse 82  Temp(Src) 98.5 F (36.9 C)  (Oral)  Ht 5' 4"  (1.626 m)  Wt 184 lb 12 oz (83.802 kg)  BMI 31.70 kg/m2  SpO2 96%   Physical Exam  Constitutional: She is oriented to person, place, and time. She appears well-developed and well-nourished. No distress.  HENT:  Head: Normocephalic and atraumatic.  Cardiovascular: Normal rate and regular rhythm.   Pulmonary/Chest: Effort normal and breath sounds normal. No respiratory distress.  Abdominal: Soft. Bowel sounds are normal.  Musculoskeletal: Normal range of motion.  Neurological: She is alert and oriented to person, place, and time.  Skin: Skin is warm. Rash noted.  Does have some erythema that does blanch, LE bilaterally No warmth  Psychiatric: She has a normal mood and affect. Her behavior is normal. Judgment and thought content normal.          Assessment & Plan:   Rash and nonspecific skin eruption  Need for influenza vaccination - Plan: Flu Vaccine QUAD 36+ mos PF IM (Fluarix Quad PF)  Essential hypertension  Grief No Follow-up on file.

## 2014-05-12 NOTE — Assessment & Plan Note (Signed)
Reasonable control but ? Side effect from higher dose of amlodipine. She does want to retry Azor now that she is under less stress. D/c lisinopril and amlodipine.  eRx sent for combo- Azor with lower dose of amlodipine. Follow up in 2 weeks at [facility].

## 2014-05-12 NOTE — Telephone Encounter (Signed)
Returned pt's call- I advised her that she should be taking progesterone since she is currently using unopposed estrogen . Pt has a uterus and needs progesterone with estrogen to prevent endometrial hyperplasia (20-50% of patients on unopposed estrogen for 1 yr will develop hyperplasia). She would like to call her GYN to discuss and will get back to me.

## 2014-05-12 NOTE — Progress Notes (Signed)
Pre visit review using our clinic review tool, if applicable. No additional management support is needed unless otherwise documented below in the visit note. 

## 2014-05-14 ENCOUNTER — Telehealth: Payer: Self-pay | Admitting: Family Medicine

## 2014-05-14 NOTE — Telephone Encounter (Signed)
emmi emailed °

## 2014-05-18 ENCOUNTER — Ambulatory Visit: Payer: BC Managed Care – PPO | Admitting: Pulmonary Disease

## 2014-06-05 HISTORY — PX: COLONOSCOPY: SHX174

## 2014-06-05 HISTORY — PX: POLYPECTOMY: SHX149

## 2014-07-06 ENCOUNTER — Other Ambulatory Visit: Payer: Self-pay

## 2014-07-15 ENCOUNTER — Encounter: Payer: Self-pay | Admitting: Family Medicine

## 2014-07-30 ENCOUNTER — Ambulatory Visit (INDEPENDENT_AMBULATORY_CARE_PROVIDER_SITE_OTHER): Payer: BLUE CROSS/BLUE SHIELD | Admitting: Family Medicine

## 2014-07-30 ENCOUNTER — Encounter: Payer: Self-pay | Admitting: Family Medicine

## 2014-07-30 ENCOUNTER — Other Ambulatory Visit (HOSPITAL_COMMUNITY)
Admission: RE | Admit: 2014-07-30 | Discharge: 2014-07-30 | Disposition: A | Discharge status: Home / Self Care | Payer: BLUE CROSS/BLUE SHIELD | Source: Ambulatory Visit | Attending: Family Medicine | Admitting: Family Medicine

## 2014-07-30 VITALS — BP 134/72 | HR 82 | Temp 98.2°F | Ht 64.0 in | Wt 186.1 lb

## 2014-07-30 DIAGNOSIS — I10 Essential (primary) hypertension: Secondary | ICD-10-CM

## 2014-07-30 DIAGNOSIS — K219 Gastro-esophageal reflux disease without esophagitis: Secondary | ICD-10-CM

## 2014-07-30 DIAGNOSIS — K7581 Nonalcoholic steatohepatitis (NASH): Secondary | ICD-10-CM

## 2014-07-30 DIAGNOSIS — Z01419 Encounter for gynecological examination (general) (routine) without abnormal findings: Secondary | ICD-10-CM | POA: Insufficient documentation

## 2014-07-30 DIAGNOSIS — Z1151 Encounter for screening for human papillomavirus (HPV): Secondary | ICD-10-CM | POA: Diagnosis present

## 2014-07-30 DIAGNOSIS — Z79890 Hormone replacement therapy (postmenopausal): Secondary | ICD-10-CM

## 2014-07-30 DIAGNOSIS — Z124 Encounter for screening for malignant neoplasm of cervix: Secondary | ICD-10-CM

## 2014-07-30 DIAGNOSIS — Z0000 Encounter for general adult medical examination without abnormal findings: Secondary | ICD-10-CM

## 2014-07-30 DIAGNOSIS — E785 Hyperlipidemia, unspecified: Secondary | ICD-10-CM

## 2014-07-30 LAB — CBC WITH DIFFERENTIAL/PLATELET
Basophils Absolute: 0.1 10*3/uL (ref 0.0–0.1)
Basophils Relative: 0.4 % (ref 0.0–3.0)
EOS PCT: 1.8 % (ref 0.0–5.0)
Eosinophils Absolute: 0.3 10*3/uL (ref 0.0–0.7)
HEMATOCRIT: 40.8 % (ref 36.0–46.0)
HEMOGLOBIN: 14 g/dL (ref 12.0–15.0)
LYMPHS ABS: 3.5 10*3/uL (ref 0.7–4.0)
Lymphocytes Relative: 21.4 % (ref 12.0–46.0)
MCHC: 34.3 g/dL (ref 30.0–36.0)
MCV: 87.8 fl (ref 78.0–100.0)
Monocytes Absolute: 1.2 10*3/uL — ABNORMAL HIGH (ref 0.1–1.0)
Monocytes Relative: 7.3 % (ref 3.0–12.0)
NEUTROS ABS: 11.3 10*3/uL — AB (ref 1.4–7.7)
Neutrophils Relative %: 69.1 % (ref 43.0–77.0)
Platelets: 369 10*3/uL (ref 150.0–400.0)
RBC: 4.65 Mil/uL (ref 3.87–5.11)
RDW: 13.4 % (ref 11.5–15.5)
WBC: 16.4 10*3/uL — AB (ref 4.0–10.5)

## 2014-07-30 LAB — COMPREHENSIVE METABOLIC PANEL
ALBUMIN: 4.7 g/dL (ref 3.5–5.2)
ALT: 66 U/L — ABNORMAL HIGH (ref 0–35)
AST: 58 U/L — AB (ref 0–37)
Alkaline Phosphatase: 137 U/L — ABNORMAL HIGH (ref 39–117)
BUN: 21 mg/dL (ref 6–23)
CALCIUM: 10.2 mg/dL (ref 8.4–10.5)
CHLORIDE: 95 meq/L — AB (ref 96–112)
CO2: 30 mEq/L (ref 19–32)
Creatinine, Ser: 0.69 mg/dL (ref 0.40–1.20)
GFR: 91.02 mL/min (ref >60.00)
GLUCOSE: 99 mg/dL (ref 70–99)
POTASSIUM: 3.8 meq/L (ref 3.5–5.1)
Sodium: 131 mEq/L — ABNORMAL LOW (ref 135–145)
Total Bilirubin: 0.4 mg/dL (ref 0.2–1.2)
Total Protein: 8.4 g/dL — ABNORMAL HIGH (ref 6.0–8.3)

## 2014-07-30 LAB — VITAMIN D 25 HYDROXY (VIT D DEFICIENCY, FRACTURES): VITD: 26.74 ng/mL — ABNORMAL LOW (ref 30.00–100.00)

## 2014-07-30 LAB — LIPID PANEL
CHOL/HDL RATIO: 5
CHOLESTEROL: 277 mg/dL — AB (ref 0–200)
HDL: 56.9 mg/dL (ref >39.00)
NonHDL: 220.1
TRIGLYCERIDES: 211 mg/dL — AB (ref 0.0–149.0)
VLDL: 42.2 mg/dL — AB (ref 0.0–40.0)

## 2014-07-30 LAB — TSH: TSH: 2.4 u[IU]/mL (ref 0.35–4.50)

## 2014-07-30 LAB — LDL CHOLESTEROL, DIRECT: Direct LDL: 191 mg/dL

## 2014-07-30 MED ORDER — AMLODIPINE BESYLATE 5 MG PO TABS: 5.0000 mg | ORAL_TABLET | Freq: Every day | ORAL | Status: DC

## 2014-07-30 MED ORDER — LISINOPRIL 40 MG PO TABS: 40.0000 mg | ORAL_TABLET | Freq: Every day | ORAL | Status: DC

## 2014-07-30 MED ORDER — ESTROGENS, CONJUGATED 0.625 MG/GM VA CREA: 1.0000 | TOPICAL_CREAM | VAGINAL | Status: DC

## 2014-07-30 NOTE — Progress Notes (Signed)
Subjective:   Patient ID: Christy Freeman, female    DOB: [birth year], 64 y.o.   MRN: [id number]  Christy Freeman is a pleasant 64 y.o. year old female who presents to clinic today with Annual Exam  on 07/30/2014  HPI:  Established care with me in 05/2014. Zoster 12/13 Influenza vaccine 05/12/14 Mammogram 04/2014 Colonoscopy 3 years ago- recall 3- 5 years- in [place]  HTN- we restarted her Azor when she established care with me in December because ? Allergic to something in amlodipine (not in combo pill).  BP increased again- wants to try another alternative.  I also advised that she talk with her GYN since she has a uterus and was taking unopposed estrogen for HRT.  Her GYN has since left practice so she wanted to talk to me about this today.  Stopped using premarin in the interim.  HLD- taking Crestor 2.5 mg every other day. Lab Results  Component Value Date   CHOL 274* 03/24/2013   HDL 57 04/09/2014   LDLCALC 131* 04/09/2014   LDLDIRECT 191.3 03/24/2013   TRIG 141 04/09/2014   CHOLHDL 3.8 04/09/2014   Lab Results  Component Value Date   CREATININE 0.6 03/24/2013   No results found for: TSH Lab Results  Component Value Date   WBC 10.0 02/05/2013   HGB 14.1 02/05/2013   HCT 40.5 02/05/2013   MCV 88.8 02/05/2013   PLT 308 02/05/2013   Current Outpatient Prescriptions on File Prior to Visit  Medication Sig Dispense Refill  . Alpha-Lipoic Acid 300 MG CAPS Take by mouth daily.    Marland Kitchen aspirin EC 81 MG tablet Take 81 mg by mouth 2 (two) times daily.    . calcium carbonate (OS-CAL - DOSED IN MG OF ELEMENTAL CALCIUM) 1250 MG tablet Take 1,250 mg by mouth daily.     . Cholecalciferol (VITAMIN D3) 1000 UNITS CAPS Take by mouth daily.    . Coenzyme Q10 (COQ10) 100 MG CAPS Take 1 capsule by mouth 2 (two) times daily.    . magnesium oxide (MAG-OX) 400 MG tablet Take 400 mg by mouth daily.    . metoprolol succinate (TOPROL-XL) 100 MG 24 hr tablet Take 100 mg by mouth daily. Take with or  immediately following a meal.    . Omega 3 1000 MG CAPS Take by mouth.    Marland Kitchen omeprazole (PRILOSEC) 20 MG capsule Take 20 mg by mouth daily.    . Probiotic Product (PROBIOTIC DAILY PO) Take by mouth 3 (three) times a week.    . rosuvastatin (CRESTOR) 5 MG tablet Take 1 tablet (5 mg total) by mouth every other day. (Patient taking differently: Take 5 mg by mouth every other day. Take 1/2 tab every other day) 30 tablet 3  . venlafaxine XR (EFFEXOR-XR) 150 MG 24 hr capsule Take 150 mg by mouth daily.     No current facility-administered medications on file prior to visit.    No Known Allergies  Past Medical History  Diagnosis Date  . Hypertension   . Hypercholesterolemia     diet controlled  . GERD (gastroesophageal reflux disease)   . H/O: depression   . Lung nodule     left lung   . Alcohol abuse   . Cancer     basil cell on face  . Thyroid disease   . Blood transfusion without reported diagnosis   . Colon polyps     Past Surgical History  Procedure Laterality Date  . Cyst removal hand Left   .  Dilation and curettage of uterus      Family History  Problem Relation Age of Onset  . Diabetes Mother   . Heart failure Mother   . Hypertension Mother   . Stroke Father   . Hypertension Father   . Heart disease Brother   . Stroke Maternal Grandmother     History   Social History  . Marital Status: Married    Spouse Name: N/A  . Number of Children: N/A  . Years of Education: N/A   Occupational History  . Not on file.   Social History Main Topics  . Smoking status: Never Smoker   . Smokeless tobacco: Never Used  . Alcohol Use: 4.8 oz/week    8 Standard drinks or equivalent per week  . Drug Use: No  . Sexual Activity: Yes   Other Topics Concern  . Not on file   Social History Narrative   The PMH, PSH, Social History, Family History, Medications, and allergies have been reviewed in [facility], and have been updated if relevant.   Review of Systems  Constitutional:  Negative.   HENT: Negative.   Respiratory: Negative.   Cardiovascular: Negative.   Gastrointestinal: Negative.   Endocrine: Negative.   Genitourinary: Negative.   Musculoskeletal: Negative.   Skin: Negative.   Allergic/Immunologic: Negative.   Neurological: Negative.   Hematological: Negative.   Psychiatric/Behavioral: Negative.   All other systems reviewed and are negative.      Objective:    BP 134/72 mmHg  Pulse 82  Temp(Src) 98.2 F (36.8 C) (Oral)  Ht 5' 4"  (1.626 m)  Wt 186 lb 1.9 oz (84.423 kg)  BMI 31.93 kg/m2  SpO2 96%   Physical Exam  General:  Well-developed,well-nourished,in no acute distress; alert,appropriate and cooperative throughout examination Head:  normocephalic and atraumatic.   Eyes:  vision grossly intact, pupils equal, pupils round, and pupils reactive to light.   Ears:  R ear normal and L ear normal.   Nose:  no external deformity.   Mouth:  good dentition.   Neck:  No deformities, masses, or tenderness noted. Breasts:  No mass, nodules, thickening, tenderness, bulging, retraction, inflamation, nipple discharge or skin changes noted.   Lungs:  Normal respiratory effort, chest expands symmetrically. Lungs are clear to auscultation, no crackles or wheezes. Heart:  Normal rate and regular rhythm. S1 and S2 normal without gallop, murmur, click, rub or other extra sounds. Abdomen:  Bowel sounds positive,abdomen soft and non-tender without masses, organomegaly or hernias noted. Rectal:  no external abnormalities.   Genitalia:  Pelvic Exam:        External: normal female genitalia without lesions or masses        Vagina: normal without lesions or masses        Cervix: normal without lesions or masses        Adnexa: normal bimanual exam without masses or fullness        Uterus: normal by palpation        Pap smear: performed Msk:  No deformity or scoliosis noted of thoracic or lumbar spine.   Extremities:  No clubbing, cyanosis, edema, or deformity  noted with normal full range of motion of all joints.   Neurologic:  alert & oriented X3 and gait normal.   Skin:  Intact without suspicious lesions or rashes Cervical Nodes:  No lymphadenopathy noted Axillary Nodes:  No palpable lymphadenopathy Psych:  Cognition and judgment appear intact. Alert and cooperative with normal attention span and concentration. No apparent  delusions, illusions, hallucinations        Assessment & Plan:   Postmenopausal HRT (hormone replacement therapy)  Steatohepatitis  Hyperlipidemia  Gastroesophageal reflux disease without esophagitis  Well woman exam No Follow-up on file.

## 2014-07-30 NOTE — Patient Instructions (Signed)
Great to see you. We will call you with your lab results. Please call your GI doctor to schedule your colonoscopy.  Please call me in a few months- we will set up your ultrasound at that time.

## 2014-07-30 NOTE — Assessment & Plan Note (Signed)
Check labs today.

## 2014-07-30 NOTE — Assessment & Plan Note (Signed)
Deteriorated although normotensive here today. D/c azor. Lisinopril 40 mg daily and amlodipine 5 mg daily sent to her pharmacy.  She will monitor BP and get back to me.

## 2014-07-30 NOTE — Progress Notes (Signed)
Pre visit review using our clinic review tool, if applicable. No additional management support is needed unless otherwise documented below in the visit note. 

## 2014-07-30 NOTE — Addendum Note (Signed)
Addended by: Jacqualin Combes on: 07/30/2014 03:03 PM   Modules accepted: Orders

## 2014-07-30 NOTE — Assessment & Plan Note (Signed)
Reviewed preventive care protocols, scheduled due services, and updated immunizations Discussed nutrition, exercise, diet, and healthy lifestyle.  Pap smear done today. 

## 2014-07-30 NOTE — Assessment & Plan Note (Signed)
>  25 minutes spent in face to face time with patient, >50% spent in counselling or coordination of care Given that she is using topical premarin only once a week, risk of endometrial hyperplasia much lower   After our discussion, we agreed to restart unopposed weekly topical premarin and plan to do a transvaginal ultrasound in 3-6 months to make sure her endometrium is not thickening. The patient indicates understanding of these issues and agrees with the plan.

## 2014-07-31 ENCOUNTER — Other Ambulatory Visit: Payer: Self-pay | Admitting: Family Medicine

## 2014-07-31 DIAGNOSIS — E871 Hypo-osmolality and hyponatremia: Secondary | ICD-10-CM

## 2014-07-31 DIAGNOSIS — D72829 Elevated white blood cell count, unspecified: Secondary | ICD-10-CM

## 2014-08-03 LAB — CYTOLOGY - PAP

## 2014-08-05 ENCOUNTER — Encounter: Payer: Self-pay | Admitting: *Deleted

## 2014-08-07 ENCOUNTER — Telehealth: Payer: Self-pay | Admitting: Family Medicine

## 2014-08-07 NOTE — Telephone Encounter (Signed)
Pt returned your call. Please call back. Thanks.

## 2014-08-10 NOTE — Telephone Encounter (Signed)
See result note.  

## 2014-08-11 MED ORDER — VITAMIN D (ERGOCALCIFEROL) 1.25 MG (50000 UNIT) PO CAPS: 50000.0000 [IU] | ORAL_CAPSULE | ORAL | Status: DC

## 2014-08-11 NOTE — Addendum Note (Signed)
Addended by: Modena Nunnery on: 08/11/2014 03:17 PM   Modules accepted: Orders

## 2014-08-13 ENCOUNTER — Other Ambulatory Visit: Payer: BLUE CROSS/BLUE SHIELD

## 2014-08-14 ENCOUNTER — Other Ambulatory Visit: Payer: BLUE CROSS/BLUE SHIELD

## 2014-08-18 ENCOUNTER — Telehealth: Payer: Self-pay

## 2014-08-18 NOTE — Telephone Encounter (Signed)
Spoke to pt states that she took her Vitamin D 5 days in a row, versus over a 5 week period. pls advise

## 2014-08-18 NOTE — Telephone Encounter (Signed)
Ok d/c vit D.  Should be ok.  Let's check a Vit D level when she comes in for her CBC.  How is she feeling?

## 2014-08-18 NOTE — Telephone Encounter (Signed)
Pt left v/m; pt took vit D 50,000 units incorrectly; and pt rescheduled WBC lab due to bacterial vaginal infection which is being treated. Left v/m requesting pt to cb to explain how she took Vit D 50,000 units incorrectly.

## 2014-08-18 NOTE — Telephone Encounter (Signed)
Spoke to pt and advised per Dr Deborra Medina. States that feels ok, and will repeat CBC and Vit D as advised.

## 2014-09-09 ENCOUNTER — Encounter: Payer: Self-pay | Admitting: Family Medicine

## 2014-09-09 ENCOUNTER — Ambulatory Visit (INDEPENDENT_AMBULATORY_CARE_PROVIDER_SITE_OTHER): Payer: BLUE CROSS/BLUE SHIELD | Admitting: Family Medicine

## 2014-09-09 VITALS — BP 138/82 | HR 67 | Temp 98.5°F | Wt 186.5 lb

## 2014-09-09 DIAGNOSIS — D72829 Elevated white blood cell count, unspecified: Secondary | ICD-10-CM | POA: Insufficient documentation

## 2014-09-09 DIAGNOSIS — E559 Vitamin D deficiency, unspecified: Secondary | ICD-10-CM

## 2014-09-09 DIAGNOSIS — Z79890 Hormone replacement therapy (postmenopausal): Secondary | ICD-10-CM

## 2014-09-09 DIAGNOSIS — Z1211 Encounter for screening for malignant neoplasm of colon: Secondary | ICD-10-CM

## 2014-09-09 DIAGNOSIS — R102 Pelvic and perineal pain: Secondary | ICD-10-CM | POA: Insufficient documentation

## 2014-09-09 DIAGNOSIS — I10 Essential (primary) hypertension: Secondary | ICD-10-CM | POA: Diagnosis not present

## 2014-09-09 DIAGNOSIS — E785 Hyperlipidemia, unspecified: Secondary | ICD-10-CM | POA: Diagnosis not present

## 2014-09-09 DIAGNOSIS — R1030 Lower abdominal pain, unspecified: Secondary | ICD-10-CM

## 2014-09-09 LAB — CBC WITH DIFFERENTIAL/PLATELET
Basophils Absolute: 0.1 10*3/uL (ref 0.0–0.1)
Basophils Relative: 0.4 % (ref 0.0–3.0)
Eosinophils Absolute: 0.2 10*3/uL (ref 0.0–0.7)
Eosinophils Relative: 1.5 % (ref 0.0–5.0)
HCT: 40.3 % (ref 36.0–46.0)
Hemoglobin: 13.7 g/dL (ref 12.0–15.0)
LYMPHS PCT: 25.2 % (ref 12.0–46.0)
Lymphs Abs: 3.2 10*3/uL (ref 0.7–4.0)
MCHC: 34 g/dL (ref 30.0–36.0)
MCV: 88.2 fl (ref 78.0–100.0)
MONOS PCT: 6 % (ref 3.0–12.0)
Monocytes Absolute: 0.8 10*3/uL (ref 0.1–1.0)
NEUTROS ABS: 8.4 10*3/uL — AB (ref 1.4–7.7)
Neutrophils Relative %: 66.9 % (ref 43.0–77.0)
Platelets: 352 10*3/uL (ref 150.0–400.0)
RBC: 4.57 Mil/uL (ref 3.87–5.11)
RDW: 12.9 % (ref 11.5–15.5)
WBC: 12.6 10*3/uL — ABNORMAL HIGH (ref 4.0–10.5)

## 2014-09-09 LAB — VITAMIN D 25 HYDROXY (VIT D DEFICIENCY, FRACTURES): VITD: 24.64 ng/mL — ABNORMAL LOW (ref 30.00–100.00)

## 2014-09-09 NOTE — Assessment & Plan Note (Signed)
Persistent.  Discussed options with Christy Freeman. No urinary symptoms and neg UA at [facility]. Will proceed with transvaginal and pelvic US for further evaluation. The patient indicates understanding of these issues and agrees with the plan.

## 2014-09-09 NOTE — Patient Instructions (Signed)
Good to see you. Please stop by to see Rosaria Ferries on your way out. We will call you with your lab results.

## 2014-09-09 NOTE — Assessment & Plan Note (Signed)
?  due to BV. Will recheck CBC today.

## 2014-09-09 NOTE — Progress Notes (Signed)
Subjective:   Patient ID: Christy Freeman, female    DOB: 1950-08-14, 64 y.o.   MRN: [id number]  Christy Freeman is a pleasant 64 y.o. year old female who presents to clinic today with Follow-up  on 09/09/2014  HPI:  When I saw her in February, we restarted her topical premarin (used weekly).  Also d/c'd azpr and started lisinopril 20 mg daily with amlodipine 5 mg daily.  Leukocytosis- Lab Results  Component Value Date   WBC 16.4* 07/30/2014   HGB 14.0 07/30/2014   HCT 40.8 07/30/2014   MCV 87.8 07/30/2014   PLT 369.0 07/30/2014   Started having suprapubic pain. Was treated with course of flagyl by OB.  Symptoms improved a little but it is persistent. Did have an odor that resolved after taking Flagyl. Per pt, UA neg at OBs office 2 weeks ago.  Lab Results  Component Value Date   CREATININE 0.69 07/30/2014    Current Outpatient Prescriptions on File Prior to Visit  Medication Sig Dispense Refill  . Alpha-Lipoic Acid 300 MG CAPS Take by mouth daily.    Marland Kitchen amLODipine (NORVASC) 5 MG tablet Take 1 tablet (5 mg total) by mouth daily. 90 tablet 3  . aspirin EC 81 MG tablet Take 81 mg by mouth 2 (two) times daily.    . calcium carbonate (OS-CAL - DOSED IN MG OF ELEMENTAL CALCIUM) 1250 MG tablet Take 1,250 mg by mouth daily.     . Cholecalciferol (VITAMIN D3) 1000 UNITS CAPS Take by mouth daily.    . Coenzyme Q10 (COQ10) 100 MG CAPS Take 1 capsule by mouth 2 (two) times daily.    Marland Kitchen conjugated estrogens (PREMARIN) vaginal cream Place 1 Applicatorful vaginally once a week. 42.5 g 3  . lisinopril (PRINIVIL,ZESTRIL) 40 MG tablet Take 1 tablet (40 mg total) by mouth daily. 90 tablet 3  . magnesium oxide (MAG-OX) 400 MG tablet Take 400 mg by mouth daily.    . metoprolol succinate (TOPROL-XL) 100 MG 24 hr tablet Take 100 mg by mouth daily. Take with or immediately following a meal.    . Omega 3 1000 MG CAPS Take by mouth.    Marland Kitchen omeprazole (PRILOSEC) 20 MG capsule Take 20 mg by mouth daily.    .  Probiotic Product (PROBIOTIC DAILY PO) Take by mouth 3 (three) times a week.    . rosuvastatin (CRESTOR) 5 MG tablet Take 1 tablet (5 mg total) by mouth every other day. (Patient taking differently: Take 5 mg by mouth every other day. Take 1/2 tab every other day) 30 tablet 3  . venlafaxine XR (EFFEXOR-XR) 150 MG 24 hr capsule Take 150 mg by mouth daily.    . Vitamin D, Ergocalciferol, (DRISDOL) 50000 UNITS CAPS capsule Take 1 capsule (50,000 Units total) by mouth every 7 (seven) days. 6 capsule 0   No current facility-administered medications on file prior to visit.    No Known Allergies  Past Medical History  Diagnosis Date  . Hypertension   . Hypercholesterolemia     diet controlled  . GERD (gastroesophageal reflux disease)   . H/O: depression   . Lung nodule     left lung   . Alcohol abuse   . Cancer     basil cell on face  . Thyroid disease   . Blood transfusion without reported diagnosis   . Colon polyps     Past Surgical History  Procedure Laterality Date  . Cyst removal hand Left   . Dilation and  curettage of uterus      Family History  Problem Relation Age of Onset  . Diabetes Mother   . Heart failure Mother   . Hypertension Mother   . Stroke Father   . Hypertension Father   . Heart disease Brother   . Stroke Maternal Grandmother     History   Social History  . Marital Status: Married    Spouse Name: N/A  . Number of Children: N/A  . Years of Education: N/A   Occupational History  . Not on file.   Social History Main Topics  . Smoking status: Never Smoker   . Smokeless tobacco: Never Used  . Alcohol Use: 4.8 oz/week    8 Standard drinks or equivalent per week  . Drug Use: No  . Sexual Activity: Yes   Other Topics Concern  . Not on file   Social History Narrative   The PMH, PSH, Social History, Family History, Medications, and allergies have been reviewed in [facility], and have been updated if relevant.   Review of Systems  Constitutional:  Negative.   HENT: Negative.   Respiratory: Negative.   Cardiovascular: Negative.   Gastrointestinal: Positive for abdominal pain. Negative for nausea, vomiting, diarrhea, constipation, blood in stool, abdominal distention, anal bleeding and rectal pain.  Genitourinary: Positive for pelvic pain. Negative for dysuria, urgency, vaginal bleeding, vaginal discharge and vaginal pain.  Allergic/Immunologic: Negative.   Neurological: Negative.   Hematological: Negative.   Psychiatric/Behavioral: Negative.        Objective:    BP 138/82 mmHg  Pulse 67  Temp(Src) 98.5 F (36.9 C) (Oral)  Wt 186 lb 8 oz (84.596 kg)  SpO2 97%   Physical Exam  Constitutional: She is oriented to person, place, and time. She appears well-developed and well-nourished. No distress.  HENT:  Head: Normocephalic.  Eyes: Conjunctivae are normal.  Cardiovascular: Normal rate.   Pulmonary/Chest: Effort normal.  Abdominal: Soft. She exhibits no distension and no mass. There is no tenderness. There is no rebound and no guarding.  Neurological: She is alert and oriented to person, place, and time. No cranial nerve deficit.  Skin: Skin is warm and dry.  Psychiatric: She has a normal mood and affect. Her behavior is normal. Judgment and thought content normal.  Nursing note and vitals reviewed.         Assessment & Plan:   Hyperlipidemia  Essential hypertension  Postmenopausal HRT (hormone replacement therapy)  Leukocytosis - Plan: CBC with Differential/Platelet  Special screening for malignant neoplasms, colon - Plan: Ambulatory referral to Gastroenterology  Suprapubic pain, unspecified laterality - Plan: US Transvaginal Non-OB, US Pelvis Complete  Vitamin D deficiency - Plan: Vitamin D, 25-hydroxy No Follow-up on file.

## 2014-09-09 NOTE — Progress Notes (Signed)
Pre visit review using our clinic review tool, if applicable. No additional management support is needed unless otherwise documented below in the visit note. 

## 2014-09-09 NOTE — Assessment & Plan Note (Signed)
Well controlled on current rx. No changes made. 

## 2014-09-10 ENCOUNTER — Other Ambulatory Visit: Payer: Self-pay | Admitting: Family Medicine

## 2014-09-10 ENCOUNTER — Ambulatory Visit
Admit: 2014-09-10 | Disposition: A | Discharge status: Home / Self Care | Payer: Self-pay | Attending: Family Medicine | Admitting: Family Medicine

## 2014-09-10 DIAGNOSIS — E559 Vitamin D deficiency, unspecified: Secondary | ICD-10-CM

## 2014-09-10 MED ORDER — VITAMIN D (ERGOCALCIFEROL) 1.25 MG (50000 UNIT) PO CAPS: 50000.0000 [IU] | ORAL_CAPSULE | ORAL | Status: DC

## 2014-09-10 NOTE — Telephone Encounter (Signed)
Medication refilled per Dr. Hulen Shouts request in a lab note.

## 2014-09-11 ENCOUNTER — Encounter: Payer: Self-pay | Admitting: Family Medicine

## 2014-09-16 ENCOUNTER — Encounter: Payer: Self-pay | Admitting: Family Medicine

## 2014-09-16 ENCOUNTER — Ambulatory Visit (INDEPENDENT_AMBULATORY_CARE_PROVIDER_SITE_OTHER): Payer: BLUE CROSS/BLUE SHIELD | Admitting: Family Medicine

## 2014-09-16 VITALS — BP 126/76 | HR 85 | Temp 98.4°F | Wt 188.8 lb

## 2014-09-16 DIAGNOSIS — R938 Abnormal findings on diagnostic imaging of other specified body structures: Secondary | ICD-10-CM | POA: Diagnosis not present

## 2014-09-16 DIAGNOSIS — R9389 Abnormal findings on diagnostic imaging of other specified body structures: Secondary | ICD-10-CM | POA: Insufficient documentation

## 2014-09-16 MED ORDER — ESTROGENS, CONJUGATED 0.625 MG/GM VA CREA: 1.0000 | TOPICAL_CREAM | VAGINAL | Status: DC

## 2014-09-16 NOTE — Progress Notes (Signed)
Subjective:   Patient ID: Christy Freeman, female    DOB: 1951/05/22, 64 y.o.   MRN: [id number]  Christy Freeman is a pleasant 64 y.o. year old female who presents to clinic today with Follow-up  on 09/16/2014  HPI:  Here to discuss her ultrasound (transvaginal and pelvic) done on 09/10/14.  Showed mild endometrial thickening which is probably within normal limits for a woman on HRT. I did suggest that she see OBGYN to make make sure they do not recommend an endometrial biopsy or D and C. She has had no postmenopausal bleeding so no further work up may be warranted.  GYN did add progesterone after I asked her to call GYN about being on unopposed estrogen.  She wants to discuss this in person with me today but already has appt scheduled with GYN.  Current Outpatient Prescriptions on File Prior to Visit  Medication Sig Dispense Refill  . Alpha-Lipoic Acid 300 MG CAPS Take by mouth daily.    Marland Kitchen amLODipine (NORVASC) 5 MG tablet Take 1 tablet (5 mg total) by mouth daily. 90 tablet 3  . aspirin EC 81 MG tablet Take 81 mg by mouth 2 (two) times daily.    . calcium carbonate (OS-CAL - DOSED IN MG OF ELEMENTAL CALCIUM) 1250 MG tablet Take 1,250 mg by mouth daily.     . Cholecalciferol (VITAMIN D3) 1000 UNITS CAPS Take by mouth daily.    . Coenzyme Q10 (COQ10) 100 MG CAPS Take 1 capsule by mouth 2 (two) times daily.    Marland Kitchen lisinopril (PRINIVIL,ZESTRIL) 40 MG tablet Take 1 tablet (40 mg total) by mouth daily. 90 tablet 3  . magnesium oxide (MAG-OX) 400 MG tablet Take 400 mg by mouth daily.    . metoprolol succinate (TOPROL-XL) 100 MG 24 hr tablet Take 100 mg by mouth daily. Take with or immediately following a meal.    . Omega 3 1000 MG CAPS Take by mouth.    Marland Kitchen omeprazole (PRILOSEC) 20 MG capsule Take 20 mg by mouth daily.    . Probiotic Product (PROBIOTIC DAILY PO) Take by mouth 3 (three) times a week.    . rosuvastatin (CRESTOR) 5 MG tablet Take 1 tablet (5 mg total) by mouth every other day. (Patient  taking differently: Take 5 mg by mouth daily. Take 1/2 tab daily) 30 tablet 3  . venlafaxine XR (EFFEXOR-XR) 150 MG 24 hr capsule Take 150 mg by mouth daily.    . Vitamin D, Ergocalciferol, (DRISDOL) 50000 UNITS CAPS capsule Take 1 capsule (50,000 Units total) by mouth every 7 (seven) days. 6 capsule 0   No current facility-administered medications on file prior to visit.    No Known Allergies  Past Medical History  Diagnosis Date  . Hypertension   . Hypercholesterolemia     diet controlled  . GERD (gastroesophageal reflux disease)   . H/O: depression   . Lung nodule     left lung   . Alcohol abuse   . Cancer     basil cell on face  . Thyroid disease   . Blood transfusion without reported diagnosis   . Colon polyps     Past Surgical History  Procedure Laterality Date  . Cyst removal hand Left   . Dilation and curettage of uterus      Family History  Problem Relation Age of Onset  . Diabetes Mother   . Heart failure Mother   . Hypertension Mother   . Stroke Father   . Hypertension  Father   . Heart disease Brother   . Stroke Maternal Grandmother     History   Social History  . Marital Status: Married    Spouse Name: N/A  . Number of Children: N/A  . Years of Education: N/A   Occupational History  . Not on file.   Social History Main Topics  . Smoking status: Never Smoker   . Smokeless tobacco: Never Used  . Alcohol Use: 4.8 oz/week    8 Standard drinks or equivalent per week  . Drug Use: No  . Sexual Activity: Yes   Other Topics Concern  . Not on file   Social History Narrative   The PMH, PSH, Social History, Family History, Medications, and allergies have been reviewed in [facility], and have been updated if relevant.   Review of Systems  Constitutional: Negative.   Genitourinary: Negative.   All other systems reviewed and are negative.      Objective:    BP 126/76 mmHg  Pulse 85  Temp(Src) 98.4 F (36.9 C) (Oral)  Wt 188 lb 12 oz (85.616 kg)   SpO2 98%   Physical Exam  Constitutional: She is oriented to person, place, and time. She appears well-developed and well-nourished. No distress.  HENT:  Head: Normocephalic.  Eyes: Conjunctivae are normal.  Pulmonary/Chest: Effort normal.  Neurological: She is alert and oriented to person, place, and time.  Skin: Skin is warm and dry.  Psychiatric: She has a normal mood and affect. Her behavior is normal. Judgment and thought content normal.  Nursing note and vitals reviewed.         Assessment & Plan:   Endometrial thickening on ultra sound No Follow-up on file.

## 2014-09-16 NOTE — Assessment & Plan Note (Signed)
>  15 minutes spent in face to face time with patient, >50% spent in counselling or coordination of care. Discussed report in detail with her and advised her to keep her appt with GYN. The patient indicates understanding of these issues and agrees with the plan.

## 2014-09-16 NOTE — Progress Notes (Signed)
Pre visit review using our clinic review tool, if applicable. No additional management support is needed unless otherwise documented below in the visit note. 

## 2014-09-23 ENCOUNTER — Encounter: Payer: Self-pay | Admitting: Gastroenterology

## 2014-10-27 ENCOUNTER — Other Ambulatory Visit: Payer: Self-pay | Admitting: Cardiology

## 2014-11-19 ENCOUNTER — Encounter (INDEPENDENT_AMBULATORY_CARE_PROVIDER_SITE_OTHER): Payer: Self-pay

## 2014-11-19 ENCOUNTER — Ambulatory Visit (AMBULATORY_SURGERY_CENTER): Payer: Self-pay | Admitting: *Deleted

## 2014-11-19 VITALS — Ht 60.0 in | Wt 187.0 lb

## 2014-11-19 DIAGNOSIS — Z86010 Personal history of colonic polyps: Secondary | ICD-10-CM

## 2014-11-19 MED ORDER — NA SULFATE-K SULFATE-MG SULF 17.5-3.13-1.6 GM/177ML PO SOLN: 1.0000 | Freq: Once | ORAL | Status: DC

## 2014-11-19 NOTE — Progress Notes (Signed)
No egg or soy allergy. No anesthesia problems.  No home O2.  No diet meds.

## 2014-12-03 ENCOUNTER — Ambulatory Visit (AMBULATORY_SURGERY_CENTER): Payer: BLUE CROSS/BLUE SHIELD | Admitting: Gastroenterology

## 2014-12-03 ENCOUNTER — Encounter: Payer: Self-pay | Admitting: Gastroenterology

## 2014-12-03 VITALS — BP 127/78 | HR 67 | Temp 98.2°F | Resp 16 | Ht 60.0 in | Wt 187.0 lb

## 2014-12-03 DIAGNOSIS — D123 Benign neoplasm of transverse colon: Secondary | ICD-10-CM | POA: Diagnosis not present

## 2014-12-03 DIAGNOSIS — Z86010 Personal history of colonic polyps: Secondary | ICD-10-CM | POA: Diagnosis not present

## 2014-12-03 MED ORDER — SODIUM CHLORIDE 0.9 % IV SOLN
500.0000 mL | INTRAVENOUS | Status: DC
Start: 2014-12-03 — End: 2014-12-03

## 2014-12-03 NOTE — Progress Notes (Signed)
Report to PACU, RN, vss, BBS= Clear.  

## 2014-12-03 NOTE — Patient Instructions (Signed)
YOU HAD AN ENDOSCOPIC PROCEDURE TODAY AT [facility]:   Refer to the procedure report that was given to you for any specific questions about what was found during the examination.  If the procedure report does not answer your questions, please call your gastroenterologist to clarify.  If you requested that your care partner not be given the details of your procedure findings, then the procedure report has been included in a sealed envelope for you to review at your convenience later.  YOU SHOULD EXPECT: Some feelings of bloating in the abdomen. Passage of more gas than usual.  Walking can help get rid of the air that was put into your GI tract during the procedure and reduce the bloating. If you had a lower endoscopy (such as a colonoscopy or flexible sigmoidoscopy) you may notice spotting of blood in your stool or on the toilet paper. If you underwent a bowel prep for your procedure, you may not have a normal bowel movement for a few days.  Please Note:  You might notice some irritation and congestion in your nose or some drainage.  This is from the oxygen used during your procedure.  There is no need for concern and it should clear up in a day or so.  SYMPTOMS TO REPORT IMMEDIATELY:   Following lower endoscopy (colonoscopy or flexible sigmoidoscopy):  Excessive amounts of blood in the stool  Significant tenderness or worsening of abdominal pains  Swelling of the abdomen that is new, acute  Fever of 100F or higher   For urgent or emergent issues, a gastroenterologist can be reached at any hour by calling [phone].   DIET: Your first meal following the procedure should be a small meal and then it is ok to progress to your normal diet. Heavy or fried foods are harder to digest and may make you feel nauseous or bloated.  Likewise, meals heavy in dairy and vegetables can increase bloating.  Drink plenty of fluids but you should avoid alcoholic beverages for 24  hours.  ACTIVITY:  You should plan to take it easy for the rest of today and you should NOT DRIVE or use heavy machinery until tomorrow (because of the sedation medicines used during the test).    FOLLOW UP: Our staff will call the number listed on your records the next business day following your procedure to check on you and address any questions or concerns that you may have regarding the information given to you following your procedure. If we do not reach you, we will leave a message.  However, if you are feeling well and you are not experiencing any problems, there is no need to return our call.  We will assume that you have returned to your regular daily activities without incident.  If any biopsies were taken you will be contacted by phone or by letter within the next 1-3 weeks.  Please call us at [phone] if you have not heard about the biopsies in 3 weeks.    SIGNATURES/CONFIDENTIALITY: You and/or your care partner have signed paperwork which will be entered into your electronic medical record.  These signatures attest to the fact that that the information above on your After Visit Summary has been reviewed and is understood.  Full responsibility of the confidentiality of this discharge information lies with you and/or your care-partner.  Polyp handout given Await pathology results Repeat Colonoscopy in 5 years

## 2014-12-03 NOTE — Op Note (Signed)
[place]  [employer]. [place], [address]   COLONOSCOPY PROCEDURE REPORT  PATIENT: Christy Freeman, Christy Freeman  MR#: [id number] BIRTHDATE: 06-Aug-1950 , 64  yrs. old GENDER: female ENDOSCOPIST: Ladene Artist, MD, [facility] REFERRED AR:WPTY, Talia MD PROCEDURE DATE:  12/03/2014 PROCEDURE:   Colonoscopy, surveillance and Colonoscopy with snare polypectomy First Screening Colonoscopy - Avg.  risk and is 50 yrs.  old or older - No.  Prior Negative Screening - Now for repeat screening. N/A  History of Adenoma - Now for follow-up colonoscopy & has been > or = to 3 yrs.  Yes hx of adenoma.  Has been 3 or more years since last colonoscopy.  Polyps removed today? Yes ASA CLASS:   Class II INDICATIONS:Surveillance due to prior colonic neoplasia and PH Colon Adenoma. MEDICATIONS: Monitored anesthesia care and Propofol 230 mg IV DESCRIPTION OF PROCEDURE:   After the risks benefits and alternatives of the procedure were thoroughly explained, informed consent was obtained.  The digital rectal exam revealed no abnormalities of the rectum.   The LB 1528  endoscope was introduced through the anus and advanced to the cecum, which was identified by both the appendix and ileocecal valve. No adverse events experienced.   The quality of the prep was excellent. (Suprep was used)  The instrument was then slowly withdrawn as the colon was fully examined. Estimated blood loss is zero unless otherwise noted in this procedure report.    COLON FINDINGS: A sessile polyp measuring 6 mm in size was found in the transverse colon.  A polypectomy was performed with a cold snare.  The resection was complete, the polyp tissue was completely retrieved and sent to histology.   The examination was otherwise normal.  Retroflexed views revealed no abnormalities. The time to cecum = 2.6 Withdrawal time = 10.0   The scope was withdrawn and the procedure completed. COMPLICATIONS: There were no immediate  complications.  ENDOSCOPIC IMPRESSION: 1.   Sessile polyp in the transverse colon; polypectomy performed with a cold snare 2.   The examination was otherwise normal  RECOMMENDATIONS: 1.  Await pathology results 2.  Repeat Colonoscopy in 5 years.  eSigned:  Ladene Artist, MD, [facility] 12/03/2014 11:38 AM

## 2014-12-03 NOTE — Progress Notes (Signed)
Called to room to assist during endoscopic procedure.  Patient ID and intended procedure confirmed with present staff. Received instructions for my participation in the procedure from the performing physician.  

## 2014-12-04 ENCOUNTER — Telehealth: Payer: Self-pay | Admitting: *Deleted

## 2014-12-04 NOTE — Telephone Encounter (Signed)
  Follow up Call-  Call back number 12/03/2014  Post procedure Call Back phone  # [phone]  Permission to leave phone message Yes     Patient questions:  Do you have a fever, pain , or abdominal swelling? No. Pain Score  0 *  Have you tolerated food without any problems? Yes.    Have you been able to return to your normal activities? Yes.    Do you have any questions about your discharge instructions: Diet   No. Medications  No. Follow up visit  No.  Do you have questions or concerns about your Care? No.  Actions: * If pain score is 4 or above: No action needed, pain <4.

## 2014-12-09 ENCOUNTER — Telehealth: Payer: Self-pay | Admitting: *Deleted

## 2014-12-09 NOTE — Telephone Encounter (Signed)
She had chol in 2/16 w/ PCP.  Do you want her to repeat?

## 2014-12-09 NOTE — Telephone Encounter (Signed)
If she is still taking low-dose Crestor or low-dose statin, would repeat her lipids before the next visit, fasting

## 2014-12-09 NOTE — Telephone Encounter (Signed)
Patient Wants to know if she needs labs before her next appt. In September.

## 2014-12-10 NOTE — Telephone Encounter (Signed)
Spoke w/ pt.  Advised her of Dr. Donivan Scull recommendation.  She states that she thinks she is due for more labs w/ her PCP and will see if she can have them all drawn there.

## 2014-12-13 ENCOUNTER — Encounter: Payer: Self-pay | Admitting: Gastroenterology

## 2014-12-18 ENCOUNTER — Telehealth: Payer: Self-pay | Admitting: Gastroenterology

## 2014-12-21 NOTE — Telephone Encounter (Signed)
Patient notified only one polyp removed.  She is mailed a copy of the procedure report for her records

## 2014-12-21 NOTE — Telephone Encounter (Signed)
Left message for patient to call back  

## 2015-01-22 ENCOUNTER — Telehealth: Payer: Self-pay | Admitting: Family Medicine

## 2015-01-22 ENCOUNTER — Other Ambulatory Visit: Payer: Self-pay | Admitting: Family Medicine

## 2015-01-22 ENCOUNTER — Telehealth: Payer: Self-pay

## 2015-01-22 ENCOUNTER — Other Ambulatory Visit: Payer: Self-pay

## 2015-01-22 ENCOUNTER — Telehealth: Payer: Self-pay | Admitting: Pulmonary Disease

## 2015-01-22 DIAGNOSIS — E559 Vitamin D deficiency, unspecified: Secondary | ICD-10-CM

## 2015-01-22 DIAGNOSIS — R911 Solitary pulmonary nodule: Secondary | ICD-10-CM

## 2015-01-22 DIAGNOSIS — E785 Hyperlipidemia, unspecified: Secondary | ICD-10-CM

## 2015-01-22 NOTE — Telephone Encounter (Signed)
Per July 2016 phone note, Dr. Rockey Situ in agreement with labs before 9/7 appt. Order placed Scheduling to call

## 2015-01-22 NOTE — Telephone Encounter (Signed)
Pt request lab order for Vit D put in as future order; pt to have blood draw next week at another [facility] site and wants to get all test done at same time. Placed future order for Vit D per lab result note 09/10/14.pt notified done.

## 2015-01-22 NOTE — Telephone Encounter (Signed)
This encounter was created in error - please disregard.

## 2015-01-22 NOTE — Telephone Encounter (Signed)
CT scan has been ordered. Pt is aware. ROV will be scheduled once we know when the CT will be.

## 2015-01-22 NOTE — Telephone Encounter (Signed)
Yes please order non-con CT chest and f/u visit

## 2015-01-22 NOTE — Telephone Encounter (Signed)
Spoke to pt and advised per Dr Deborra Medina. States she had not spoken with GYN as of yet, but will make arrangements to do so

## 2015-01-22 NOTE — Telephone Encounter (Signed)
Pt calling asks if she can have bloodwork order for lipid and hepatic panel  before her appt on 9/7. States she had been Crestor, and would like to have her blood checked so Dr. Rockey Situ will have results by her appt day.

## 2015-01-22 NOTE — Telephone Encounter (Signed)
Her last ultrasound was in 09/2014 and we advised she follow up with GYN at that time.  It has not yet been 6 months.  What did GYN say?

## 2015-01-22 NOTE — Telephone Encounter (Signed)
Pt called to get her pelvic/tran vaginal u/s schedule She stated she thought she needed 6 month follow up  [facility] Any day and any time

## 2015-01-22 NOTE — Telephone Encounter (Signed)
Patient says that she had a CT done July 2015 and she was told that she needed to do a follow up CT in July 2016.  Patient says that she has not heard anything from our office about scheduling follow up CT.  She said that she has a lung nodule.  Patient wants to have CT scheduled.    BQ - ok to order CT?

## 2015-01-27 ENCOUNTER — Telehealth: Payer: Self-pay

## 2015-01-27 NOTE — Telephone Encounter (Signed)
Pt has question regarding her labwork to be done tomorrow, requests [facility] call her.

## 2015-01-27 NOTE — Telephone Encounter (Signed)
Spoke w/ pt.  She reports that the last time she came in, she had a difficult time last and requests that I draw her blood.  Advised her that Dolores Lory, CMA specializes in rolling veins and that I can assist her.  She would like for me to be in the room to calm her.  Advised her that I would be happy to.

## 2015-01-28 ENCOUNTER — Other Ambulatory Visit (INDEPENDENT_AMBULATORY_CARE_PROVIDER_SITE_OTHER): Payer: BLUE CROSS/BLUE SHIELD

## 2015-01-28 ENCOUNTER — Ambulatory Visit
Admission: RE | Admit: 2015-01-28 | Discharge: 2015-01-28 | Disposition: A | Discharge status: Home / Self Care | Payer: BLUE CROSS/BLUE SHIELD | Source: Ambulatory Visit | Attending: Pulmonary Disease | Admitting: Pulmonary Disease

## 2015-01-28 DIAGNOSIS — R911 Solitary pulmonary nodule: Secondary | ICD-10-CM | POA: Diagnosis not present

## 2015-01-28 DIAGNOSIS — K760 Fatty (change of) liver, not elsewhere classified: Secondary | ICD-10-CM | POA: Insufficient documentation

## 2015-01-28 DIAGNOSIS — E785 Hyperlipidemia, unspecified: Secondary | ICD-10-CM

## 2015-01-28 DIAGNOSIS — E559 Vitamin D deficiency, unspecified: Secondary | ICD-10-CM

## 2015-01-28 DIAGNOSIS — I2510 Atherosclerotic heart disease of native coronary artery without angina pectoris: Secondary | ICD-10-CM | POA: Diagnosis not present

## 2015-01-29 LAB — HEPATIC FUNCTION PANEL
ALK PHOS: 136 IU/L — AB (ref 39–117)
ALT: 56 IU/L — ABNORMAL HIGH (ref 0–32)
AST: 61 IU/L — ABNORMAL HIGH (ref 0–40)
Albumin: 4.8 g/dL (ref 3.6–4.8)
BILIRUBIN TOTAL: 0.4 mg/dL (ref 0.0–1.2)
BILIRUBIN, DIRECT: 0.1 mg/dL (ref 0.00–0.40)
Total Protein: 7.6 g/dL (ref 6.0–8.5)

## 2015-01-29 LAB — VITAMIN D 25 HYDROXY (VIT D DEFICIENCY, FRACTURES): Vit D, 25-Hydroxy: 26.7 ng/mL — ABNORMAL LOW (ref 30.0–100.0)

## 2015-01-29 LAB — LIPID PANEL
CHOLESTEROL TOTAL: 258 mg/dL — AB (ref 100–199)
Chol/HDL Ratio: 3.8 ratio units (ref 0.0–4.4)
HDL: 68 mg/dL (ref >39)
LDL Calculated: 154 mg/dL — ABNORMAL HIGH (ref 0–99)
TRIGLYCERIDES: 178 mg/dL — AB (ref 0–149)
VLDL Cholesterol Cal: 36 mg/dL (ref 5–40)

## 2015-01-31 ENCOUNTER — Encounter: Payer: Self-pay | Admitting: Family Medicine

## 2015-02-01 ENCOUNTER — Other Ambulatory Visit: Payer: Self-pay

## 2015-02-01 MED ORDER — ROSUVASTATIN CALCIUM 5 MG PO TABS: 5.0000 mg | ORAL_TABLET | Freq: Every day | ORAL | Status: DC

## 2015-02-02 ENCOUNTER — Telehealth: Payer: Self-pay

## 2015-02-02 ENCOUNTER — Other Ambulatory Visit: Payer: Self-pay | Admitting: *Deleted

## 2015-02-02 MED ORDER — METOPROLOL SUCCINATE ER 100 MG PO TB24: 100.0000 mg | ORAL_TABLET | Freq: Every day | ORAL | Status: DC

## 2015-02-02 MED ORDER — AMLODIPINE BESYLATE 10 MG PO TABS: 10.0000 mg | ORAL_TABLET | Freq: Every day | ORAL | Status: DC

## 2015-02-02 NOTE — Telephone Encounter (Signed)
Pt left v/m requesting cb about med. Did not leave name of med. Left v/m requesting cb.

## 2015-02-03 NOTE — Telephone Encounter (Signed)
Pt received refill from Dr Rockey Situ and will cb when refill needed; pt wants Dr Deborra Medina to fill meds.

## 2015-02-04 ENCOUNTER — Other Ambulatory Visit: Payer: BLUE CROSS/BLUE SHIELD

## 2015-02-10 ENCOUNTER — Ambulatory Visit (INDEPENDENT_AMBULATORY_CARE_PROVIDER_SITE_OTHER): Payer: BLUE CROSS/BLUE SHIELD | Admitting: Cardiovascular Disease

## 2015-02-10 ENCOUNTER — Encounter: Payer: Self-pay | Admitting: Cardiovascular Disease

## 2015-02-10 VITALS — BP 156/78 | HR 80 | Ht 64.0 in | Wt 184.0 lb

## 2015-02-10 DIAGNOSIS — I10 Essential (primary) hypertension: Secondary | ICD-10-CM | POA: Diagnosis not present

## 2015-02-10 DIAGNOSIS — I700 Atherosclerosis of aorta: Secondary | ICD-10-CM | POA: Insufficient documentation

## 2015-02-10 DIAGNOSIS — E669 Obesity, unspecified: Secondary | ICD-10-CM | POA: Diagnosis not present

## 2015-02-10 DIAGNOSIS — E785 Hyperlipidemia, unspecified: Secondary | ICD-10-CM

## 2015-02-10 NOTE — Progress Notes (Signed)
Patient ID: Christy Freeman, female    DOB: 12-Oct-1950, 64 y.o.   MRN: [id number]  HPI Comments:  64 y.o. Female with h/o fatty liver and  LDL  190-200 mg/dL,  Previous evaluation in the ER following chest pains, which were ultimately felt to be due to noncardiac chest pain, poorly controlled blood pressure,   ACS was ruled out.  Stress test negative.  She had lowered her LDL to 141 mg/dL in the past with high fiber / low fat diet, exercise, and 30 lbs weight loss.  She has regained all of this weight back   LDL back up to 191 mg/dL now.  She presents for evaluation of hyperlipidemia, aortic atherosclerosis  Recent follow-up CT scan showed no coronary calcifications, minimal descending aorta calcification, none in the proximal carotid arteries. She is taking Crestor 5 mg sporadically, LDL 150 down from 190 In general she feels well, no regular exercise program, weight continues to be an issue Lung nodule had not changed on CT scan  [place]  The patient has risk factors; HTN,hyperlipidemia  EKG on today's visit shows normal sinus rhythm with rate 80 bpm, no significant ST or T-wave changes  Other past medical history Family history:  Mother (MI at 80 y.o. ; Diabetes at age 80 y.o.).  Father (TIA at 66 y.o.);  Brother (MI at 42 y.o.) Social History:  Drinks wine 4-6 glasses per week  Previously drank 4 nights per week.  She stopped 2009-2011 after seeing fatty liver results, but restarted in 2011 after stress levels increased.   No tobacco use.    Liver history:  Patient diagnosed with fatty liver on U/S 06/2007 by Dr. Gaye Pollack [facility], [place]).  Her LFTs went all the way to normal she tells me once she stopped drinking alcohol in 2009 - then went up after restating alcohol.  He checks LFTs annually.  ALT/AST were in the 90's in 03/06/13, then improved to 40's late 03/24/13 after she reduced her alcohol consumption from 4 days per week down to 2 nights per week (2-3 glasses per day).      Labs:  TC 274, TG 135, HDL 60, LDL191, Glucose 99, ALT 47, AST 34 (03/24/2013) -   No Known Allergies  Current Outpatient Prescriptions on File Prior to Visit  Medication Sig Dispense Refill  . Alpha-Lipoic Acid 300 MG CAPS Take by mouth daily.    Marland Kitchen amLODipine (NORVASC) 10 MG tablet Take 1 tablet (10 mg total) by mouth daily. 30 tablet 1  . aspirin EC 81 MG tablet Take 81 mg by mouth 2 (two) times daily.    Marland Kitchen CALCIUM-MAGNESIUM PO Take by mouth.    . Cholecalciferol (VITAMIN D3) 1000 UNITS CAPS Take by mouth daily.    . Coenzyme Q10 (COQ10) 100 MG CAPS Take 1 capsule by mouth 2 (two) times daily.    Marland Kitchen lisinopril (PRINIVIL,ZESTRIL) 40 MG tablet Take 1 tablet (40 mg total) by mouth daily. 90 tablet 3  . metoprolol succinate (TOPROL-XL) 100 MG 24 hr tablet Take 1 tablet (100 mg total) by mouth daily. Take with or immediately following a meal. 30 tablet 0  . Omega 3 1000 MG CAPS Take by mouth.    Marland Kitchen omeprazole (PRILOSEC) 20 MG capsule Take 20 mg by mouth daily.    . Probiotic Product (PROBIOTIC DAILY PO) Take by mouth daily.     . rosuvastatin (CRESTOR) 5 MG tablet Take 1 tablet (5 mg total) by mouth at  bedtime. 30 tablet 3  . venlafaxine XR (EFFEXOR-XR) 150 MG 24 hr capsule Take 150 mg by mouth daily.     No current facility-administered medications on file prior to visit.    Past Medical History  Diagnosis Date  . Hypertension   . Hypercholesterolemia     diet controlled  . GERD (gastroesophageal reflux disease)   . H/O: depression   . Lung nodule     left lung   . Alcohol abuse   . Cancer     basil cell on face  . Thyroid disease   . Blood transfusion without reported diagnosis   . Colon polyps     Past Surgical History  Procedure Laterality Date  . Cyst removal hand Left   . Dilation and curettage of uterus    . Colonoscopy      Social History  reports that she has never smoked. She has never used smokeless tobacco. She reports that she drinks about 8.4 oz of  alcohol per week. She reports that she does not use illicit drugs.  Family History family history includes Diabetes in her mother; Heart disease in her brother; Heart failure in her mother; Hypertension in her father and mother; Stroke in her father and maternal grandmother. There is no history of Colon cancer.   Review of Systems  Constitutional: Negative.   Respiratory: Negative.   Cardiovascular: Negative.   Gastrointestinal: Negative.   Musculoskeletal: Negative.   Skin: Negative.   Neurological: Negative.   Hematological: Negative.   Psychiatric/Behavioral: Negative.   All other systems reviewed and are negative.   BP 156/78 mmHg  Pulse 80  Ht 5' 4"  (1.626 m)  Wt 184 lb (83.462 kg)  BMI 31.57 kg/m2   Physical Exam  Constitutional: She is oriented to person, place, and time. She appears well-developed and well-nourished.  obese  HENT:  Head: Normocephalic.  Nose: Nose normal.  Mouth/Throat: Oropharynx is clear and moist.  Eyes: Conjunctivae are normal. Pupils are equal, round, and reactive to light.  Neck: Normal range of motion. Neck supple. No JVD present.  Cardiovascular: Normal rate, regular rhythm, S1 normal, S2 normal, normal heart sounds and intact distal pulses.  Exam reveals no gallop and no friction rub.   No murmur heard. Pulmonary/Chest: Effort normal and breath sounds normal. No respiratory distress. She has no wheezes. She has no rales. She exhibits no tenderness.  Abdominal: Soft. Bowel sounds are normal. She exhibits no distension. There is no tenderness.  Musculoskeletal: Normal range of motion. She exhibits no edema or tenderness.  Lymphadenopathy:    She has no cervical adenopathy.  Neurological: She is alert and oriented to person, place, and time. Coordination normal.  Skin: Skin is warm and dry. No rash noted. No erythema.  Psychiatric: She has a normal mood and affect. Her behavior is normal. Judgment and thought content normal.    Assessment  and Plan  Nursing note and vitals reviewed.

## 2015-02-10 NOTE — Patient Instructions (Signed)
You are doing well. No medication changes were made.  Please monitor blood pressure  Please call us if you have new issues that need to be addressed before your next appt.  Your physician wants you to follow-up in: 12 months.  You will receive a reminder letter in the mail two months in advance. If you don't receive a letter, please call our office to schedule the follow-up appointment.

## 2015-02-10 NOTE — Assessment & Plan Note (Signed)
Recommended she continue Crestor 5 mg daily Now with signs of atherosclerosis in her aorta, very mild

## 2015-02-10 NOTE — Assessment & Plan Note (Signed)
We have encouraged continued exercise, careful diet management in an effort to lose weight. 

## 2015-02-10 NOTE — Assessment & Plan Note (Signed)
Minimal aortic atherosclerosis seen on CT scan last week Images shown to her in detail She will stay on her Crestor

## 2015-02-10 NOTE — Assessment & Plan Note (Signed)
Blood pressure is elevated today, even on recheck with systolic 834 We'll continue current medications, she will closely monitor her blood pressure at home and call our office with numbers If blood pressure continues to run high, could possibly add HCTZ, or clonidine, or other

## 2015-03-08 ENCOUNTER — Telehealth: Payer: Self-pay | Admitting: *Deleted

## 2015-03-08 MED ORDER — METOPROLOL SUCCINATE ER 100 MG PO TB24: 100.0000 mg | ORAL_TABLET | Freq: Every day | ORAL | Status: DC

## 2015-03-08 NOTE — Telephone Encounter (Signed)
Refill sent for metoprolol.

## 2015-03-08 NOTE — Telephone Encounter (Signed)
°  1. Which medications need to be refilled? Metoprolol   2. Which pharmacy is medication to be sent to? [facility] [address]   3. Do they need a 30 day or 90 day supply? 90   4. Would they like a call back once the medication has been sent to the pharmacy? No

## 2015-03-29 ENCOUNTER — Other Ambulatory Visit: Payer: Self-pay | Admitting: *Deleted

## 2015-03-29 MED ORDER — AMLODIPINE BESYLATE 10 MG PO TABS: 10.0000 mg | ORAL_TABLET | Freq: Every day | ORAL | Status: DC

## 2015-04-06 ENCOUNTER — Encounter: Payer: Self-pay | Admitting: Family Medicine

## 2015-04-06 ENCOUNTER — Other Ambulatory Visit: Payer: Self-pay | Admitting: Family Medicine

## 2015-04-06 MED ORDER — VITAMIN D (ERGOCALCIFEROL) 1.25 MG (50000 UNIT) PO CAPS: 50000.0000 [IU] | ORAL_CAPSULE | ORAL | Status: DC

## 2015-06-23 ENCOUNTER — Other Ambulatory Visit: Payer: Self-pay | Admitting: Family Medicine

## 2015-06-30 ENCOUNTER — Telehealth: Payer: Self-pay

## 2015-06-30 DIAGNOSIS — M1990 Unspecified osteoarthritis, unspecified site: Secondary | ICD-10-CM

## 2015-06-30 NOTE — Telephone Encounter (Signed)
Pt left v/m requesting cb about a referral; left v/m requesting pt to cb.

## 2015-07-23 ENCOUNTER — Other Ambulatory Visit: Payer: Self-pay | Admitting: Family Medicine

## 2015-07-23 DIAGNOSIS — Z01419 Encounter for gynecological examination (general) (routine) without abnormal findings: Secondary | ICD-10-CM

## 2015-07-28 ENCOUNTER — Other Ambulatory Visit (INDEPENDENT_AMBULATORY_CARE_PROVIDER_SITE_OTHER): Payer: PPO

## 2015-07-28 DIAGNOSIS — Z0000 Encounter for general adult medical examination without abnormal findings: Secondary | ICD-10-CM | POA: Diagnosis not present

## 2015-07-28 DIAGNOSIS — Z01419 Encounter for gynecological examination (general) (routine) without abnormal findings: Secondary | ICD-10-CM

## 2015-07-28 LAB — LIPID PANEL
CHOL/HDL RATIO: 3
Cholesterol: 236 mg/dL — ABNORMAL HIGH (ref 0–200)
HDL: 72.8 mg/dL (ref >39.00)
LDL Cholesterol: 135 mg/dL — ABNORMAL HIGH (ref 0–99)
NonHDL: 163.31
TRIGLYCERIDES: 144 mg/dL (ref 0.0–149.0)
VLDL: 28.8 mg/dL (ref 0.0–40.0)

## 2015-07-28 LAB — COMPREHENSIVE METABOLIC PANEL
ALK PHOS: 137 U/L — AB (ref 39–117)
ALT: 61 U/L — AB (ref 0–35)
AST: 68 U/L — ABNORMAL HIGH (ref 0–37)
Albumin: 4.7 g/dL (ref 3.5–5.2)
BILIRUBIN TOTAL: 0.5 mg/dL (ref 0.2–1.2)
BUN: 15 mg/dL (ref 6–23)
CALCIUM: 9.9 mg/dL (ref 8.4–10.5)
CO2: 31 meq/L (ref 19–32)
Chloride: 95 mEq/L — ABNORMAL LOW (ref 96–112)
Creatinine, Ser: 0.57 mg/dL (ref 0.40–1.20)
GFR: 113.12 mL/min (ref >60.00)
Glucose, Bld: 99 mg/dL (ref 70–99)
POTASSIUM: 4.1 meq/L (ref 3.5–5.1)
Sodium: 133 mEq/L — ABNORMAL LOW (ref 135–145)
Total Protein: 8.2 g/dL (ref 6.0–8.3)

## 2015-07-28 LAB — CBC WITH DIFFERENTIAL/PLATELET
BASOS ABS: 0.1 10*3/uL (ref 0.0–0.1)
BASOS PCT: 0.7 % (ref 0.0–3.0)
EOS PCT: 3.2 % (ref 0.0–5.0)
Eosinophils Absolute: 0.3 10*3/uL (ref 0.0–0.7)
HEMATOCRIT: 40.7 % (ref 36.0–46.0)
Hemoglobin: 13.8 g/dL (ref 12.0–15.0)
LYMPHS ABS: 2.6 10*3/uL (ref 0.7–4.0)
LYMPHS PCT: 26.1 % (ref 12.0–46.0)
MCHC: 33.9 g/dL (ref 30.0–36.0)
MCV: 86.9 fl (ref 78.0–100.0)
MONOS PCT: 7.2 % (ref 3.0–12.0)
Monocytes Absolute: 0.7 10*3/uL (ref 0.1–1.0)
NEUTROS ABS: 6.3 10*3/uL (ref 1.4–7.7)
Neutrophils Relative %: 62.8 % (ref 43.0–77.0)
PLATELETS: 335 10*3/uL (ref 150.0–400.0)
RBC: 4.69 Mil/uL (ref 3.87–5.11)
RDW: 13.7 % (ref 11.5–15.5)
WBC: 10.1 10*3/uL (ref 4.0–10.5)

## 2015-07-28 LAB — TSH: TSH: 2.07 u[IU]/mL (ref 0.35–4.50)

## 2015-08-03 ENCOUNTER — Telehealth: Payer: Self-pay | Admitting: Family Medicine

## 2015-08-03 ENCOUNTER — Encounter: Payer: Self-pay | Admitting: Family Medicine

## 2015-08-03 ENCOUNTER — Ambulatory Visit (INDEPENDENT_AMBULATORY_CARE_PROVIDER_SITE_OTHER): Payer: PPO | Admitting: Family Medicine

## 2015-08-03 VITALS — BP 110/66 | HR 67 | Temp 98.2°F | Ht 64.0 in | Wt 192.2 lb

## 2015-08-03 DIAGNOSIS — E785 Hyperlipidemia, unspecified: Secondary | ICD-10-CM | POA: Diagnosis not present

## 2015-08-03 DIAGNOSIS — E2839 Other primary ovarian failure: Secondary | ICD-10-CM

## 2015-08-03 DIAGNOSIS — Z01419 Encounter for gynecological examination (general) (routine) without abnormal findings: Secondary | ICD-10-CM

## 2015-08-03 DIAGNOSIS — F329 Major depressive disorder, single episode, unspecified: Secondary | ICD-10-CM

## 2015-08-03 DIAGNOSIS — K219 Gastro-esophageal reflux disease without esophagitis: Secondary | ICD-10-CM

## 2015-08-03 DIAGNOSIS — Z0000 Encounter for general adult medical examination without abnormal findings: Secondary | ICD-10-CM | POA: Diagnosis not present

## 2015-08-03 DIAGNOSIS — I10 Essential (primary) hypertension: Secondary | ICD-10-CM | POA: Diagnosis not present

## 2015-08-03 DIAGNOSIS — Z23 Encounter for immunization: Secondary | ICD-10-CM | POA: Diagnosis not present

## 2015-08-03 DIAGNOSIS — F32A Depression, unspecified: Secondary | ICD-10-CM

## 2015-08-03 NOTE — Assessment & Plan Note (Signed)
Controlled on current dose of Effexor.

## 2015-08-03 NOTE — Telephone Encounter (Signed)
Referral placed.  See phone note.

## 2015-08-03 NOTE — Assessment & Plan Note (Signed)
Symptoms quiet with PPI. No changes made today.

## 2015-08-03 NOTE — Assessment & Plan Note (Signed)
Improved control with daily Crestor. No changes made to rxs.

## 2015-08-03 NOTE — Progress Notes (Signed)
Subjective:   Patient ID: Christy Freeman, female    DOB: [birth year], 65 y.o.   MRN: [id number]  Christy Freeman is a pleasant 65 y.o. year old female who presents to clinic today with Annual Exam  on 08/03/2015  HPI:  Here for welcome to medicare physical and follow up of chronic medical conditions.  I have personally reviewed the Medicare Annual Wellness questionnaire and have noted 1. The patient's medical and social history 2. Their use of alcohol, tobacco or illicit drugs 3. Their current medications and supplements 4. The patient's functional ability including ADL's, fall risks, home safety risks and hearing or visual             impairment. 5. Diet and physical activities 6. Evidence for depression or mood disorders  End of life wishes discussed and updated in Social History.  The roster of all physicians providing medical care to patient - is listed in the CareTeams section of the chart.   Zoster 12/13 Influenza vaccine 05/12/14 Mammogram 03/2015- 3 D in [place] Colonoscopy 12/03/14- [facility] Pap smear 07/30/14- done by me. She has stopped taking HRT. Feels great.  No hot flashes or vaginal dryness.  HTN-saw Dr. Rockey Situ on 02/10/15. Note reviewed.  BP was elevated in his office but he did not changes rxs.  Advised to keep an eye on and if remained elevated, consider HCTZ or clonidine. She is currently taking Norvasc 10 mg daily, lisinopril 40 mg daily and Toprol XL 100 mg daily.  Depression- symptoms have been well controlled on current dose of Effexor. Denies any symptoms of anxiety or depression.  HLD- taking Crestor 5 mg daily ( increased this from 2.5 mg daily and labs have improved).  Lab Results  Component Value Date   CHOL 236* 07/28/2015   HDL 72.80 07/28/2015   LDLCALC 135* 07/28/2015   LDLDIRECT 191.0 07/30/2014   TRIG 144.0 07/28/2015   CHOLHDL 3 07/28/2015   Lab Results  Component Value Date   CREATININE 0.57 07/28/2015   Lab Results  Component Value Date     TSH 2.07 07/28/2015   Lab Results  Component Value Date   WBC 10.1 07/28/2015   HGB 13.8 07/28/2015   HCT 40.7 07/28/2015   MCV 86.9 07/28/2015   PLT 335.0 07/28/2015   Current Outpatient Prescriptions on File Prior to Visit  Medication Sig Dispense Refill  . amLODipine (NORVASC) 10 MG tablet Take 1 tablet (10 mg total) by mouth daily. 30 tablet 11  . aspirin EC 81 MG tablet Take 81 mg by mouth 2 (two) times daily.    Marland Kitchen CALCIUM-MAGNESIUM PO Take by mouth.    . Cholecalciferol (VITAMIN D3) 1000 UNITS CAPS Take by mouth daily.    . Coenzyme Q10 (COQ10) 100 MG CAPS Take 1 capsule by mouth 2 (two) times daily.    Marland Kitchen lisinopril (PRINIVIL,ZESTRIL) 40 MG tablet Take 1 tablet (40 mg total) by mouth daily. OFFICE VISIT REQUIRED FOR ADDITIONAL REFILLS 30 tablet 0  . metoprolol succinate (TOPROL-XL) 100 MG 24 hr tablet Take 1 tablet (100 mg total) by mouth daily. Take with or immediately following a meal. 30 tablet 6  . Omega 3 1000 MG CAPS Take by mouth.    Marland Kitchen omeprazole (PRILOSEC) 20 MG capsule Take 20 mg by mouth daily.    . Probiotic Product (PROBIOTIC DAILY PO) Take by mouth daily.     . rosuvastatin (CRESTOR) 5 MG tablet Take 1 tablet (5 mg total) by mouth at bedtime. 30 tablet 3  .  venlafaxine XR (EFFEXOR-XR) 150 MG 24 hr capsule Take 150 mg by mouth daily.     No current facility-administered medications on file prior to visit.    No Known Allergies  Past Medical History  Diagnosis Date  . Hypertension   . Hypercholesterolemia     diet controlled  . GERD (gastroesophageal reflux disease)   . H/O: depression   . Lung nodule     left lung   . Alcohol abuse   . Cancer (HCC)     basil cell on face  . Thyroid disease   . Blood transfusion without reported diagnosis   . Colon polyps     Past Surgical History  Procedure Laterality Date  . Cyst removal hand Left   . Dilation and curettage of uterus    . Colonoscopy      Family History  Problem Relation Age of Onset  .  Diabetes Mother   . Heart failure Mother   . Hypertension Mother   . Stroke Father   . Hypertension Father   . Heart disease Brother   . Stroke Maternal Grandmother   . Colon cancer Neg Hx     Social History   Social History  . Marital Status: Married    Spouse Name: N/A  . Number of Children: N/A  . Years of Education: N/A   Occupational History  . Not on file.   Social History Main Topics  . Smoking status: Never Smoker   . Smokeless tobacco: Never Used  . Alcohol Use: 8.4 oz/week    8 Standard drinks or equivalent, 6 Glasses of wine per week  . Drug Use: No  . Sexual Activity: Yes   Other Topics Concern  . Not on file   Social History Narrative   She would desire CPR.   The PMH, PSH, Social History, Family History, Medications, and allergies have been reviewed in [facility], and have been updated if relevant.   Review of Systems  Constitutional: Negative.   HENT: Negative.   Respiratory: Negative.   Cardiovascular: Negative.   Gastrointestinal: Negative.   Endocrine: Negative.   Genitourinary: Negative.   Musculoskeletal: Negative.   Skin: Negative.   Allergic/Immunologic: Negative.   Neurological: Negative.   Hematological: Negative.   Psychiatric/Behavioral: Negative.   All other systems reviewed and are negative.      Objective:    BP 110/66 mmHg  Pulse 67  Temp(Src) 98.2 F (36.8 C) (Oral)  Ht 5' 4"  (1.626 m)  Wt 192 lb 4 oz (87.204 kg)  BMI 32.98 kg/m2  SpO2 98%   Physical Exam  General:  Well-developed,well-nourished,in no acute distress; alert,appropriate and cooperative throughout examination Head:  normocephalic and atraumatic.   Eyes:  vision grossly intact, pupils equal, pupils round, and pupils reactive to light.   Ears:  R ear normal and L ear normal.   Nose:  no external deformity.   Mouth:  good dentition.   Neck:  No deformities, masses, or tenderness noted. Breasts:  No mass, nodules, thickening, tenderness, bulging,  retraction, inflamation, nipple discharge or skin changes noted.   Lungs:  Normal respiratory effort, chest expands symmetrically. Lungs are clear to auscultation, no crackles or wheezes. Heart:  Normal rate and regular rhythm. S1 and S2 normal without gallop, murmur, click, rub or other extra sounds. Abdomen:  Bowel sounds positive,abdomen soft and non-tender without masses, organomegaly or hernias noted. Msk:  No deformity or scoliosis noted of thoracic or lumbar spine.   Extremities:  No clubbing,  cyanosis, edema, or deformity noted with normal full range of motion of all joints.   Neurologic:  alert & oriented X3 and gait normal.   Skin:  Intact without suspicious lesions or rashes Cervical Nodes:  No lymphadenopathy noted Axillary Nodes:  No palpable lymphadenopathy Psych:  Cognition and judgment appear intact. Alert and cooperative with normal attention span and concentration. No apparent delusions, illusions, hallucinations        Assessment & Plan:   Welcome to Medicare preventive visit - Plan: EKG 12-Lead  Well woman exam  Hyperlipidemia  Essential hypertension  Gastroesophageal reflux disease without esophagitis  Depression No Follow-up on file.

## 2015-08-03 NOTE — Telephone Encounter (Signed)
Pt requesting referral to a PT in [place] for orthotic device for arthritis for rt index finger; it is a silver splint that slows the process or deviation. Pt will have contact info when [facility] calls back. Pt had annual 08/03/15.

## 2015-08-03 NOTE — Assessment & Plan Note (Addendum)
The patients weight, height, BMI and visual acuity have been recorded in the chart.  Cognitive function assessed.   I have made referrals, counseling and provided education to the patient based review of the above and I have provided the pt with a written personalized care plan for preventive services.  Prevnar 13 today.  EKG reassuring today- NSR and unchanged from prior.

## 2015-08-03 NOTE — Progress Notes (Signed)
Pre visit review using our clinic review tool, if applicable. No additional management support is needed unless otherwise documented below in the visit note. 

## 2015-08-03 NOTE — Addendum Note (Signed)
Addended by: Modena Nunnery on: 08/03/2015 10:09 AM   Modules accepted: Orders

## 2015-08-03 NOTE — Patient Instructions (Signed)
Great to see you. Happy birthday!  Please stop by to see Rosaria Ferries on your way out.

## 2015-08-03 NOTE — Assessment & Plan Note (Signed)
Much better control.  No changes made to current rxs today.

## 2015-08-04 NOTE — Telephone Encounter (Signed)
Opened in error

## 2015-08-11 DIAGNOSIS — M25641 Stiffness of right hand, not elsewhere classified: Secondary | ICD-10-CM | POA: Diagnosis not present

## 2015-08-11 DIAGNOSIS — M6281 Muscle weakness (generalized): Secondary | ICD-10-CM | POA: Diagnosis not present

## 2015-08-11 DIAGNOSIS — M13141 Monoarthritis, not elsewhere classified, right hand: Secondary | ICD-10-CM | POA: Diagnosis not present

## 2015-08-11 DIAGNOSIS — M79644 Pain in right finger(s): Secondary | ICD-10-CM | POA: Diagnosis not present

## 2015-08-17 ENCOUNTER — Ambulatory Visit
Admission: RE | Admit: 2015-08-17 | Discharge: 2015-08-17 | Disposition: A | Discharge status: Home / Self Care | Payer: PPO | Source: Ambulatory Visit | Attending: Family Medicine | Admitting: Family Medicine

## 2015-08-17 DIAGNOSIS — M8589 Other specified disorders of bone density and structure, multiple sites: Secondary | ICD-10-CM | POA: Diagnosis not present

## 2015-08-17 DIAGNOSIS — M810 Age-related osteoporosis without current pathological fracture: Secondary | ICD-10-CM | POA: Diagnosis not present

## 2015-08-17 DIAGNOSIS — M8588 Other specified disorders of bone density and structure, other site: Secondary | ICD-10-CM | POA: Diagnosis not present

## 2015-08-17 DIAGNOSIS — Z0000 Encounter for general adult medical examination without abnormal findings: Secondary | ICD-10-CM | POA: Diagnosis not present

## 2015-08-18 ENCOUNTER — Encounter: Payer: Self-pay | Admitting: *Deleted

## 2015-08-22 ENCOUNTER — Other Ambulatory Visit: Payer: Self-pay | Admitting: Cardiovascular Disease

## 2015-08-22 ENCOUNTER — Other Ambulatory Visit: Payer: Self-pay | Admitting: Family Medicine

## 2015-08-30 ENCOUNTER — Telehealth: Payer: Self-pay

## 2015-08-30 DIAGNOSIS — M13141 Monoarthritis, not elsewhere classified, right hand: Secondary | ICD-10-CM | POA: Diagnosis not present

## 2015-08-30 DIAGNOSIS — M6281 Muscle weakness (generalized): Secondary | ICD-10-CM | POA: Diagnosis not present

## 2015-08-30 DIAGNOSIS — M25641 Stiffness of right hand, not elsewhere classified: Secondary | ICD-10-CM | POA: Diagnosis not present

## 2015-08-30 DIAGNOSIS — M79644 Pain in right finger(s): Secondary | ICD-10-CM | POA: Diagnosis not present

## 2015-08-30 NOTE — Telephone Encounter (Signed)
Pt request date of last pap smear;pt notified last pap smear 07/30/14. Pt voiced understanding. Pt is going to [facility] and will ck with them to see how often needs to get pap since GYN stopped pt from using hormone therapy due to thickening endometrium.

## 2015-09-29 ENCOUNTER — Other Ambulatory Visit: Payer: Self-pay | Admitting: Family Medicine

## 2015-09-29 NOTE — Telephone Encounter (Signed)
Pt left v/m requesting status of metoprolol.pts last annual on 08/03/15. Pt notified refill done per protocol. Pt voiced understanding.

## 2015-10-22 ENCOUNTER — Encounter: Payer: Self-pay | Admitting: Family Medicine

## 2015-10-25 ENCOUNTER — Other Ambulatory Visit: Payer: Self-pay | Admitting: Family Medicine

## 2015-10-25 MED ORDER — OMEPRAZOLE 10 MG PO CPDR: 10.0000 mg | DELAYED_RELEASE_CAPSULE | Freq: Every day | ORAL | Status: DC

## 2015-11-15 ENCOUNTER — Other Ambulatory Visit: Payer: Self-pay | Admitting: Family Medicine

## 2015-11-18 MED ORDER — VENLAFAXINE HCL ER 150 MG PO CP24: 150.0000 mg | ORAL_CAPSULE | Freq: Every day | ORAL | Status: DC

## 2015-11-18 NOTE — Telephone Encounter (Addendum)
Pt left /vm requesting refill venlafaxine to [facility] [address]. Pt request cb. Pt last annual 08/03/15. Spoke with Earl Lagos who verified with Dr Deborra Medina to fill per protocol. Refill done per protocol and left v/m per [facility] refill done.

## 2015-11-18 NOTE — Addendum Note (Signed)
Addended by: Helene Shoe on: 11/18/2015 09:23 AM   Modules accepted: Orders

## 2016-01-07 ENCOUNTER — Other Ambulatory Visit: Payer: Self-pay

## 2016-01-07 DIAGNOSIS — E785 Hyperlipidemia, unspecified: Secondary | ICD-10-CM

## 2016-02-27 ENCOUNTER — Other Ambulatory Visit: Payer: Self-pay | Admitting: Cardiovascular Disease

## 2016-03-02 ENCOUNTER — Other Ambulatory Visit: Payer: PPO

## 2016-03-02 NOTE — Addendum Note (Signed)
Addended by: Britt Bottom on: 03/02/2016 08:49 AM   Modules accepted: Orders

## 2016-03-07 ENCOUNTER — Ambulatory Visit: Payer: Self-pay | Admitting: Cardiovascular Disease

## 2016-04-12 DIAGNOSIS — X32XXXA Exposure to sunlight, initial encounter: Secondary | ICD-10-CM | POA: Diagnosis not present

## 2016-04-12 DIAGNOSIS — D2262 Melanocytic nevi of left upper limb, including shoulder: Secondary | ICD-10-CM | POA: Diagnosis not present

## 2016-04-12 DIAGNOSIS — D2271 Melanocytic nevi of right lower limb, including hip: Secondary | ICD-10-CM | POA: Diagnosis not present

## 2016-04-12 DIAGNOSIS — D225 Melanocytic nevi of trunk: Secondary | ICD-10-CM | POA: Diagnosis not present

## 2016-04-18 ENCOUNTER — Other Ambulatory Visit: Payer: Self-pay | Admitting: Family Medicine

## 2016-04-18 DIAGNOSIS — Z1231 Encounter for screening mammogram for malignant neoplasm of breast: Secondary | ICD-10-CM

## 2016-05-09 ENCOUNTER — Other Ambulatory Visit: Payer: Self-pay | Admitting: Family Medicine

## 2016-05-24 ENCOUNTER — Ambulatory Visit
Admission: RE | Admit: 2016-05-24 | Discharge: 2016-05-24 | Disposition: A | Discharge status: Home / Self Care | Payer: PPO | Source: Ambulatory Visit | Attending: Family Medicine | Admitting: Family Medicine

## 2016-05-24 DIAGNOSIS — Z1231 Encounter for screening mammogram for malignant neoplasm of breast: Secondary | ICD-10-CM | POA: Diagnosis not present

## 2016-06-09 ENCOUNTER — Telehealth: Payer: Self-pay | Admitting: Cardiovascular Disease

## 2016-06-09 NOTE — Telephone Encounter (Signed)
3 attempts to schedule fu from recall list.  Deleting recall.  °

## 2016-06-26 ENCOUNTER — Other Ambulatory Visit: Payer: Self-pay | Admitting: Cardiovascular Disease

## 2016-07-10 ENCOUNTER — Other Ambulatory Visit: Payer: Self-pay | Admitting: Family Medicine

## 2016-07-17 ENCOUNTER — Other Ambulatory Visit: Payer: Self-pay | Admitting: Family Medicine

## 2016-07-17 ENCOUNTER — Other Ambulatory Visit: Payer: Self-pay | Admitting: Cardiovascular Disease

## 2016-07-18 ENCOUNTER — Encounter: Payer: Self-pay | Admitting: Family Medicine

## 2016-07-27 DIAGNOSIS — L820 Inflamed seborrheic keratosis: Secondary | ICD-10-CM | POA: Diagnosis not present

## 2016-07-27 DIAGNOSIS — D485 Neoplasm of uncertain behavior of skin: Secondary | ICD-10-CM | POA: Diagnosis not present

## 2016-07-28 ENCOUNTER — Other Ambulatory Visit: Payer: Self-pay | Admitting: Cardiovascular Disease

## 2016-08-02 ENCOUNTER — Other Ambulatory Visit: Payer: Self-pay | Admitting: Family Medicine

## 2016-08-02 DIAGNOSIS — Z01419 Encounter for gynecological examination (general) (routine) without abnormal findings: Secondary | ICD-10-CM | POA: Insufficient documentation

## 2016-08-02 DIAGNOSIS — I10 Essential (primary) hypertension: Secondary | ICD-10-CM

## 2016-08-03 ENCOUNTER — Ambulatory Visit (INDEPENDENT_AMBULATORY_CARE_PROVIDER_SITE_OTHER): Payer: PPO

## 2016-08-03 VITALS — BP 130/88 | HR 82 | Temp 98.7°F | Ht 64.0 in | Wt 192.5 lb

## 2016-08-03 DIAGNOSIS — E559 Vitamin D deficiency, unspecified: Secondary | ICD-10-CM

## 2016-08-03 DIAGNOSIS — Z0000 Encounter for general adult medical examination without abnormal findings: Secondary | ICD-10-CM | POA: Diagnosis not present

## 2016-08-03 DIAGNOSIS — Z01419 Encounter for gynecological examination (general) (routine) without abnormal findings: Secondary | ICD-10-CM

## 2016-08-03 LAB — COMPREHENSIVE METABOLIC PANEL
ALBUMIN: 4.6 g/dL (ref 3.5–5.2)
ALK PHOS: 117 U/L (ref 39–117)
ALT: 68 U/L — ABNORMAL HIGH (ref 0–35)
AST: 80 U/L — ABNORMAL HIGH (ref 0–37)
BUN: 14 mg/dL (ref 6–23)
CHLORIDE: 96 meq/L (ref 96–112)
CO2: 33 mEq/L — ABNORMAL HIGH (ref 19–32)
Calcium: 9.8 mg/dL (ref 8.4–10.5)
Creatinine, Ser: 0.56 mg/dL (ref 0.40–1.20)
GFR: 115.09 mL/min (ref >60.00)
Glucose, Bld: 104 mg/dL — ABNORMAL HIGH (ref 70–99)
POTASSIUM: 4 meq/L (ref 3.5–5.1)
SODIUM: 133 meq/L — AB (ref 135–145)
Total Bilirubin: 0.5 mg/dL (ref 0.2–1.2)
Total Protein: 8.2 g/dL (ref 6.0–8.3)

## 2016-08-03 LAB — CBC WITH DIFFERENTIAL/PLATELET
BASOS PCT: 0.7 % (ref 0.0–3.0)
Basophils Absolute: 0.1 10*3/uL (ref 0.0–0.1)
EOS PCT: 1.7 % (ref 0.0–5.0)
Eosinophils Absolute: 0.2 10*3/uL (ref 0.0–0.7)
HCT: 40 % (ref 36.0–46.0)
HEMOGLOBIN: 13.6 g/dL (ref 12.0–15.0)
LYMPHS ABS: 2.8 10*3/uL (ref 0.7–4.0)
Lymphocytes Relative: 25.8 % (ref 12.0–46.0)
MCHC: 33.9 g/dL (ref 30.0–36.0)
MCV: 90 fl (ref 78.0–100.0)
MONO ABS: 0.8 10*3/uL (ref 0.1–1.0)
MONOS PCT: 7.2 % (ref 3.0–12.0)
Neutro Abs: 6.9 10*3/uL (ref 1.4–7.7)
Neutrophils Relative %: 64.6 % (ref 43.0–77.0)
Platelets: 321 10*3/uL (ref 150.0–400.0)
RBC: 4.45 Mil/uL (ref 3.87–5.11)
RDW: 13.1 % (ref 11.5–15.5)
WBC: 10.7 10*3/uL — ABNORMAL HIGH (ref 4.0–10.5)

## 2016-08-03 LAB — LIPID PANEL
Cholesterol: 217 mg/dL — ABNORMAL HIGH (ref 0–200)
HDL: 69.8 mg/dL (ref >39.00)
LDL Cholesterol: 126 mg/dL — ABNORMAL HIGH (ref 0–99)
NonHDL: 147.15
TRIGLYCERIDES: 108 mg/dL (ref 0.0–149.0)
Total CHOL/HDL Ratio: 3
VLDL: 21.6 mg/dL (ref 0.0–40.0)

## 2016-08-03 LAB — TSH: TSH: 1.82 u[IU]/mL (ref 0.35–4.50)

## 2016-08-03 LAB — VITAMIN D 25 HYDROXY (VIT D DEFICIENCY, FRACTURES): VITD: 27.83 ng/mL — ABNORMAL LOW (ref 30.00–100.00)

## 2016-08-03 NOTE — Progress Notes (Signed)
Subjective:   Christy Freeman is a 66 y.o. female who presents for Medicare Annual (Subsequent) preventive examination.  Review of Systems:  N/A Cardiac Risk Factors include: advanced age (>47mn, >>48women);obesity (BMI >30kg/m2);dyslipidemia;hypertension     Objective:     Vitals: BP 130/88 (BP Location: Left Arm, Patient Position: Sitting, Cuff Size: Normal) Comment: BP meds taken 30 min ago  Pulse 82   Temp 98.7 F (37.1 C) (Oral)   Ht 5' 4"  (1.626 m) Comment: no shoes  Wt 192 lb 8 oz (87.3 kg)   SpO2 96%   BMI 33.04 kg/m   Body mass index is 33.04 kg/m.   Tobacco History  Smoking Status  . Never Smoker  Smokeless Tobacco  . Never Used     Counseling given: No   Past Medical History:  Diagnosis Date  . Alcohol abuse   . Blood transfusion without reported diagnosis   . Cancer (HCC)    basil cell on face  . Colon polyps   . GERD (gastroesophageal reflux disease)   . H/O: depression   . Hypercholesterolemia    diet controlled  . Hypertension   . Lung nodule    left lung   . Thyroid disease    Past Surgical History:  Procedure Laterality Date  . COLONOSCOPY    . CYST REMOVAL HAND Left   . DILATION AND CURETTAGE OF UTERUS     Family History  Problem Relation Age of Onset  . Diabetes Mother   . Heart failure Mother   . Hypertension Mother   . Stroke Father   . Hypertension Father   . Heart disease Brother   . Stroke Maternal Grandmother   . Colon cancer Neg Hx    History  Sexual Activity  . Sexual activity: Yes    Outpatient Encounter Prescriptions as of 08/03/2016  Medication Sig  . amLODipine (NORVASC) 10 MG tablet take 1 tablet by mouth once daily  . aspirin EC 81 MG tablet Take 81 mg by mouth 2 (two) times daily.  .Marland KitchenCALCIUM-MAGNESIUM PO Take by mouth.  . Coenzyme Q10 (COQ10) 100 MG CAPS Take 1 capsule by mouth 2 (two) times daily.  .Marland Kitchenlisinopril (PRINIVIL,ZESTRIL) 40 MG tablet take 1 tablet by mouth once daily NEED OFFICE VISIT.  .Marland Kitchen metoprolol succinate (TOPROL-XL) 100 MG 24 hr tablet take 1 tablet by mouth once daily  . Omega 3 1000 MG CAPS Take 1 capsule by mouth daily.   .Marland Kitchenomeprazole (PRILOSEC) 10 MG capsule Take 1 capsule (10 mg total) by mouth daily. COMPLETE PHYSICAL EXAM REQUIRED FOR ADDITIONAL REFILLS  . Probiotic Product (PROBIOTIC DAILY PO) Take 1 capsule by mouth as needed.   . rosuvastatin (CRESTOR) 5 MG tablet take 1 tablet by mouth at bedtime  . venlafaxine XR (EFFEXOR-XR) 150 MG 24 hr capsule Take 1 capsule (150 mg total) by mouth daily.  . [DISCONTINUED] Cholecalciferol (VITAMIN D3) 1000 UNITS CAPS Take by mouth daily.   No facility-administered encounter medications on file as of 08/03/2016.     Activities of Daily Living In your present state of health, do you have any difficulty performing the following activities: 08/03/2016  Hearing? Y  Vision? N  Difficulty concentrating or making decisions? N  Walking or climbing stairs? N  Dressing or bathing? N  Doing errands, shopping? N  Preparing Food and eating ? N  Using the Toilet? N  In the past six months, have you accidently leaked urine? N  Do you have problems  with loss of bowel control? N  Managing your Medications? N  Managing your Finances? N  Housekeeping or managing your Housekeeping? N  Some recent data might be hidden    Patient Care Team: Lucille Passy, MD as PCP - General (Family Medicine) Minna Merritts, MD as Consulting Physician (Cardiology) Ladene Artist, MD as Consulting Physician (Gastroenterology)    Assessment:     Hearing Screening   125Hz  250Hz  500Hz  1000Hz  2000Hz  3000Hz  4000Hz  6000Hz  8000Hz   Right ear:           Left ear:   40 0 40  0    Comments: Right ear - hearing aid  Vision Screening Comments: Last vision exam in March 2017 @ [facility]   Exercise Activities and Dietary recommendations Current Exercise Habits: Home exercise routine, Type of exercise: yoga;walking;Other - see comments (swimming),  Time (Minutes): 60, Frequency (Times/Week): 3, Weekly Exercise (Minutes/Week): 180, Intensity: Moderate, Exercise limited by: None identified  Goals    . Increase physical activity          Starting 08/03/2016, I will continue to exercise at least 60 min 3 days per week.     Marland Kitchen LDL CALC < 130          < 130 at least, and would prefer LDL < 100 if possible given family history      Fall Risk Fall Risk  08/03/2016  Falls in the past year? No   Depression Screen PHQ 2/9 Scores 08/03/2016  PHQ - 2 Score 0     Cognitive Function MMSE - Mini Mental State Exam 08/03/2016  Orientation to time 5  Orientation to Place 5  Registration 3  Attention/ Calculation 0  Recall 3  Language- name 2 objects 0  Language- repeat 1  Language- follow 3 step command 3  Language- read & follow direction 0  Write a sentence 0  Copy design 0  Total score 20     PLEASE NOTE: A Mini-Cog screen was completed. Maximum score is 20. A value of 0 denotes this part of Folstein MMSE was not completed or the patient failed this part of the Mini-Cog screening.   Mini-Cog Screening Orientation to Time - Max 5 pts Orientation to Place - Max 5 pts Registration - Max 3 pts Recall - Max 3 pts Language Repeat - Max 1 pts Language Follow 3 Step Command - Max 3 pts     Immunization History  Administered Date(s) Administered  . Influenza Split 04/15/2013  . Influenza,inj,Quad PF,36+ Mos 05/12/2014  . Pneumococcal Conjugate-13 08/03/2015  . Zoster 05/12/2012   Screening Tests Health Maintenance  Topic Date Due  . TETANUS/TDAP  09/02/2016 (Originally 07/07/1969)  . PNA vac Low Risk Adult (2 of 2 - PPSV23) 09/02/2016 (Originally 08/02/2016)  . MAMMOGRAM  05/24/2018  . COLONOSCOPY  12/03/2019  . INFLUENZA VACCINE  Addressed  . DEXA SCAN  Completed  . Hepatitis C Screening  Completed      Plan:     I have personally reviewed and addressed the Medicare Annual Wellness questionnaire and have noted the following in  the patient's chart:  A. Medical and social history B. Use of alcohol, tobacco or illicit drugs  C. Current medications and supplements D. Functional ability and status E.  Nutritional status F.  Physical activity G. Advance directives H. List of other physicians I.  Hospitalizations, surgeries, and ER visits in previous 12 months J.  Vitals K. Screenings to include hearing, vision, cognitive,  depression L. Referrals and appointments - none  In addition, I have reviewed and discussed with patient certain preventive protocols, quality metrics, and best practice recommendations. A written personalized care plan for preventive services as well as general preventive health recommendations were provided to patient.  See attached scanned questionnaire for additional information.   Signed,   Lindell Noe, MHA, BS, LPN Health Coach

## 2016-08-03 NOTE — Patient Instructions (Signed)
Christy Freeman , Thank you for taking time to come for your Medicare Wellness Visit. I appreciate your ongoing commitment to your health goals. Please review the following plan we discussed and let me know if I can assist you in the future.   These are the goals we discussed: Goals    . Increase physical activity          Starting 08/03/2016, I will continue to exercise at least 60 min 3 days per week.     Marland Kitchen LDL CALC < 130          < 130 at least, and would prefer LDL < 100 if possible given family history       This is a list of the screening recommended for you and due dates:  Health Maintenance  Topic Date Due  . Tetanus Vaccine  09/02/2016*  . Pneumonia vaccines (2 of 2 - PPSV23) 09/02/2016*  . Mammogram  05/24/2018  . Colon Cancer Screening  12/03/2019  . Flu Shot  Addressed  . DEXA scan (bone density measurement)  Completed  .  Hepatitis C: One time screening is recommended by Center for Disease Control  (CDC) for  adults born from [birth year] through 1965.   Completed  *Topic was postponed. The date shown is not the original due date.   Preventive Care for Adults  A healthy lifestyle and preventive care can promote health and wellness. Preventive health guidelines for adults include the following key practices.  . A routine yearly physical is a good way to check with your health care provider about your health and preventive screening. It is a chance to share any concerns and updates on your health and to receive a thorough exam.  . Visit your dentist for a routine exam and preventive care every 6 months. Brush your teeth twice a day and floss once a day. Good oral hygiene prevents tooth decay and gum disease.  . The frequency of eye exams is based on your age, health, family medical history, use  of contact lenses, and other factors. Follow your health care provider's ecommendations for frequency of eye exams.  . Eat a healthy diet. Foods like vegetables, fruits, whole grains,  low-fat dairy products, and lean protein foods contain the nutrients you need without too many calories. Decrease your intake of foods high in solid fats, added sugars, and salt. Eat the right amount of calories for you. Get information about a proper diet from your health care provider, if necessary.  . Regular physical exercise is one of the most important things you can do for your health. Most adults should get at least 150 minutes of moderate-intensity exercise (any activity that increases your heart rate and causes you to sweat) each week. In addition, most adults need muscle-strengthening exercises on 2 or more days a week.  Silver Sneakers may be a benefit available to you. To determine eligibility, you may visit the website: www.silversneakers.com or contact program at [phone]) [phone] Mon-Fri between 8AM-8PM.   . Maintain a healthy weight. The body mass index (BMI) is a screening tool to identify possible weight problems. It provides an estimate of body fat based on height and weight. Your health care provider can find your BMI and can help you achieve or maintain a healthy weight.   For adults 20 years and older: ? A BMI below 18.5 is considered underweight. ? A BMI of 18.5 to 24.9 is normal. ? A BMI of 25 to 29.9 is considered overweight. ?  A BMI of 30 and above is considered obese.   . Maintain normal blood lipids and cholesterol levels by exercising and minimizing your intake of saturated fat. Eat a balanced diet with plenty of fruit and vegetables. Blood tests for lipids and cholesterol should begin at age 41 and be repeated every 5 years. If your lipid or cholesterol levels are high, you are over 50, or you are at high risk for heart disease, you may need your cholesterol levels checked more frequently. Ongoing high lipid and cholesterol levels should be treated with medicines if diet and exercise are not working.  . If you smoke, find out from your health care provider how to quit. If  you do not use tobacco, please do not start.  . If you choose to drink alcohol, please do not consume more than 2 drinks per day. One drink is considered to be 12 ounces (355 mL) of beer, 5 ounces (148 mL) of wine, or 1.5 ounces (44 mL) of liquor.  . If you are 1-1 years old, ask your health care provider if you should take aspirin to prevent strokes.  . Use sunscreen. Apply sunscreen liberally and repeatedly throughout the day. You should seek shade when your shadow is shorter than you. Protect yourself by wearing long sleeves, pants, a wide-brimmed hat, and sunglasses year round, whenever you are outdoors.  . Once a month, do a whole body skin exam, using a mirror to look at the skin on your back. Tell your health care provider of new moles, moles that have irregular borders, moles that are larger than a pencil eraser, or moles that have changed in shape or color.

## 2016-08-03 NOTE — Progress Notes (Signed)
Pre visit review using our clinic review tool, if applicable. No additional management support is needed unless otherwise documented below in the visit note. 

## 2016-08-03 NOTE — Progress Notes (Signed)
I reviewed health advisor's note, was available for consultation, and agree with documentation and plan.  

## 2016-08-03 NOTE — Progress Notes (Signed)
PCP notes:   Health maintenance:  PPSV23 - addressed; pt will check immunization record at pharmacy to prevent duplication of vaccine; if not taken, pt will get vaccine at next CPE appt  Tetanus - addressed; pt will check immunization record at pharmacy  Abnormal screenings:   Hearing (left ear only) - failed  Patient concerns:   PAP smear - pt desires to have PAP smear at CPE appt  Omeprazole - pt desires to stop medication   Vit D level - pt wants to discuss if a deficiency still exists  Nurse concerns:  None  Next PCP appt:   08/15/16 @ 0730

## 2016-08-07 ENCOUNTER — Encounter: Payer: Self-pay | Admitting: Cardiovascular Disease

## 2016-08-07 ENCOUNTER — Other Ambulatory Visit: Payer: Self-pay | Admitting: Family Medicine

## 2016-08-07 ENCOUNTER — Ambulatory Visit (INDEPENDENT_AMBULATORY_CARE_PROVIDER_SITE_OTHER): Payer: PPO | Admitting: Cardiovascular Disease

## 2016-08-07 VITALS — BP 148/76 | HR 88 | Ht 64.0 in | Wt 193.5 lb

## 2016-08-07 DIAGNOSIS — E6609 Other obesity due to excess calories: Secondary | ICD-10-CM

## 2016-08-07 DIAGNOSIS — Z6830 Body mass index (BMI) 30.0-30.9, adult: Secondary | ICD-10-CM

## 2016-08-07 DIAGNOSIS — I10 Essential (primary) hypertension: Secondary | ICD-10-CM

## 2016-08-07 DIAGNOSIS — E785 Hyperlipidemia, unspecified: Secondary | ICD-10-CM

## 2016-08-07 DIAGNOSIS — E66811 Obesity, class 1: Secondary | ICD-10-CM

## 2016-08-07 DIAGNOSIS — I700 Atherosclerosis of aorta: Secondary | ICD-10-CM | POA: Diagnosis not present

## 2016-08-07 MED ORDER — EZETIMIBE 10 MG PO TABS: 10.0000 mg | ORAL_TABLET | Freq: Every day | ORAL | 3 refills | Status: DC

## 2016-08-07 NOTE — Progress Notes (Signed)
Cardiology Office Note  Date:  08/07/2016   ID:  Christy Freeman, DOB [birth year], MRN [id number]  PCP:  Arnette Norris, MD   Chief Complaint  Patient presents with  . other    Pt. needs medications refilled and was last seen in 2016. Meds reviewed by the pt. verbally. "doing well."     HPI:  66 y.o. Female with h/o fatty liver and  LDL  190-200 mg/dL,  Previous evaluation in the ER following chest pains, which were ultimately felt to be due to noncardiac chest pain, poorly controlled blood pressure,   ACS was ruled out.  Stress test negative.  She had lowered her LDL to 141 mg/dL in the past with high fiber / low fat diet, exercise, and 30 lbs weight loss.  She has regained all of this weight back   LDL back up to 191 mg/dL now.  She presents for evaluation of hyperlipidemia, aortic atherosclerosis     in follow-up today she reports that she feels well Denies any significant chest pain or shortness of breath on exertion  She is tolerating Crestor 5 mg daily She is not checking her blood pressure at home She does yoga, no regular aerobic exercise  [place]  The patient has risk factors; HTN,hyperlipidemia  Lab work reviewed with her, vitamin D low Total cholesterol improved down to 220  Previous CT scan showed no coronary calcifications, minimal descending aorta calcification, none in the proximal carotid arteries.  EKG on today's visit shows normal  sinus rhythm with rate 80 bpm, no significant ST or T-wave changes  Other past medical history Family history:  Mother (MI at 65 y.o. ; Diabetes at age 5 y.o.).  Father (TIA at 68 y.o.);  Brother (MI at 32 y.o.) Social History:  Drinks wine 4-6 glasses per week  Previously drank 4 nights per week.  She stopped 2009-2011 after seeing fatty liver results, but restarted in 2011 after stress levels increased.   No tobacco use.    Liver history:  Patient diagnosed with fatty liver on U/S 06/2007 by Dr. Gaye Pollack [facility],  [place]).  Her LFTs went all the way to normal she tells me once she stopped drinking alcohol in 2009 - then went up after restating alcohol.  He checks LFTs annually.  ALT/AST were in the 90's in 03/06/13, then improved to 40's late 03/24/13 after she reduced her alcohol consumption from 4 days per week down to 2 nights per week (2-3 glasses per day).     Labs:  TC 274, TG 135, HDL 60, LDL191, Glucose 99, ALT 47, AST 34 (03/24/2013) -  PMH:   has a past medical history of Alcohol abuse; Blood transfusion without reported diagnosis; Cancer ([place]); Colon polyps; GERD (gastroesophageal reflux disease); H/O: depression; Hypercholesterolemia; Hypertension; Lung nodule; and Thyroid disease.  PSH:    Past Surgical History:  Procedure Laterality Date  . COLONOSCOPY    . CYST REMOVAL HAND Left   . DILATION AND CURETTAGE OF UTERUS      Current Outpatient Prescriptions  Medication Sig Dispense Refill  . amLODipine (NORVASC) 10 MG tablet take 1 tablet by mouth once daily 30 tablet 3  . aspirin EC 81 MG tablet Take 81 mg by mouth 2 (two) times daily.    Marland Kitchen CALCIUM-MAGNESIUM PO Take by mouth.    . Coenzyme Q10 (COQ10) 100 MG CAPS Take 1 capsule by mouth 2 (two) times daily.    Marland Kitchen lisinopril (PRINIVIL,ZESTRIL) 40 MG tablet  take 1 tablet by mouth once daily NEED OFFICE VISIT. 90 tablet 3  . metoprolol succinate (TOPROL-XL) 100 MG 24 hr tablet take 1 tablet by mouth once daily 30 tablet 11  . Omega 3 1000 MG CAPS Take 1 capsule by mouth daily.     Marland Kitchen omeprazole (PRILOSEC) 10 MG capsule Take 1 capsule (10 mg total) by mouth daily. COMPLETE PHYSICAL EXAM REQUIRED FOR ADDITIONAL REFILLS 90 capsule 0  . Probiotic Product (PROBIOTIC DAILY PO) Take 1 capsule by mouth as needed.     . rosuvastatin (CRESTOR) 5 MG tablet take 1 tablet by mouth at bedtime 30 tablet 3  . venlafaxine XR (EFFEXOR-XR) 150 MG 24 hr capsule Take 1 capsule (150 mg total) by mouth daily. 30 capsule 8  . ezetimibe (ZETIA) 10 MG tablet Take 1 tablet  (10 mg total) by mouth daily. 90 tablet 3   No current facility-administered medications for this visit.      Allergies:   Patient has no known allergies.   Social History:  The patient  reports that she has never smoked. She has never used smokeless tobacco. She reports that she drinks about 8.4 oz of alcohol per week . She reports that she does not use drugs.   Family History:   family history includes Diabetes in her mother; Heart disease in her brother; Heart failure in her mother; Hypertension in her father and mother; Stroke in her father and maternal grandmother.    Review of Systems: Review of Systems  Constitutional: Negative.   Respiratory: Negative.   Cardiovascular: Negative.   Gastrointestinal: Negative.   Musculoskeletal: Negative.   Neurological: Negative.   Psychiatric/Behavioral: Negative.   All other systems reviewed and are negative.    PHYSICAL EXAM: VS:  BP (!) 148/76 (BP Location: Left Arm, Patient Position: Sitting, Cuff Size: Normal)   Pulse 88   Ht 5' 4"  (1.626 m)   Wt 193 lb 8 oz (87.8 kg)   BMI 33.21 kg/m  , BMI Body mass index is 33.21 kg/m. GEN: Well nourished, well developed, in no acute distress  HEENT: normal  Neck: no JVD, carotid bruits, or masses Cardiac: RRR; no murmurs, rubs, or gallops,no edema  Respiratory:  clear to auscultation bilaterally, normal work of breathing GI: soft, nontender, nondistended, + BS MS: no deformity or atrophy  Skin: warm and dry, no rash Neuro:  Strength and sensation are intact Psych: euthymic mood, full affect    Recent Labs: 08/03/2016: ALT 68; BUN 14; Creatinine, Ser 0.56; Hemoglobin 13.6; Platelets 321.0; Potassium 4.0; Sodium 133; TSH 1.82    Lipid Panel Lab Results  Component Value Date   CHOL 217 (H) 08/03/2016   HDL 69.80 08/03/2016   LDLCALC 126 (H) 08/03/2016   TRIG 108.0 08/03/2016      Wt Readings from Last 3 Encounters:  08/07/16 193 lb 8 oz (87.8 kg)  08/03/16 192 lb 8 oz (87.3  kg)  08/03/15 192 lb 4 oz (87.2 kg)       ASSESSMENT AND PLAN:  Essential hypertension - Plan: EKG 12-Lead Recommended she monitor blood pressure at home and call our office if numbers run high We could add HCTZ if needed  Hyperlipidemia, unspecified hyperlipidemia type - Plan: EKG 12-Lead Long discussion concerning cholesterol management She is interested in lower numbers. We will add Zetia daily with her low-dose Crestor  Aortic atherosclerosis (HCC) Minimal aortic atherosclerosis on prior CT scan  Class 1 obesity due to excess calories without serious comorbidity with body  mass index (BMI) of 30.0 to 30.9 in adult We have encouraged continued exercise, careful diet management in an effort to lose weight.   Total encounter time more than 15 minutes  Greater than 50% was spent in counseling and coordination of care with the patient   Disposition:   F/U  12 months   Orders Placed This Encounter  Procedures  . EKG 12-Lead     Signed, Esmond Plants, M.D., Ph.D. 08/07/2016  [place], [place]

## 2016-08-07 NOTE — Patient Instructions (Addendum)
Medication Instructions:   Goal pressures <140 on the top, < 90 on the bottom   Consider trying the zetia daily  Labwork:  No new labs needed  Testing/Procedures:  No further testing at this time   I recommend watching educational videos on topics of interest to you at:       www.goemmi.com  Enter code: HEARTCARE    Follow-Up: It was a pleasure seeing you in the office today. Please call us if you have new issues that need to be addressed before your next appt.  [phone]  Your physician wants you to follow-up in: 12 months.  You will receive a reminder letter in the mail two months in advance. If you don't receive a letter, please call our office to schedule the follow-up appointment.  If you need a refill on your cardiac medications before your next appointment, please call your pharmacy.

## 2016-08-14 ENCOUNTER — Other Ambulatory Visit: Payer: Self-pay | Admitting: Family Medicine

## 2016-08-15 ENCOUNTER — Encounter: Payer: PPO | Admitting: Family Medicine

## 2016-08-16 ENCOUNTER — Encounter: Payer: Self-pay | Admitting: Family Medicine

## 2016-08-16 ENCOUNTER — Ambulatory Visit (INDEPENDENT_AMBULATORY_CARE_PROVIDER_SITE_OTHER): Payer: PPO | Admitting: Family Medicine

## 2016-08-16 VITALS — BP 140/74 | HR 85 | Temp 98.6°F | Wt 193.0 lb

## 2016-08-16 DIAGNOSIS — Z0000 Encounter for general adult medical examination without abnormal findings: Secondary | ICD-10-CM

## 2016-08-16 DIAGNOSIS — F329 Major depressive disorder, single episode, unspecified: Secondary | ICD-10-CM

## 2016-08-16 DIAGNOSIS — I10 Essential (primary) hypertension: Secondary | ICD-10-CM

## 2016-08-16 DIAGNOSIS — Z01419 Encounter for gynecological examination (general) (routine) without abnormal findings: Secondary | ICD-10-CM

## 2016-08-16 DIAGNOSIS — E785 Hyperlipidemia, unspecified: Secondary | ICD-10-CM | POA: Diagnosis not present

## 2016-08-16 DIAGNOSIS — F32A Depression, unspecified: Secondary | ICD-10-CM

## 2016-08-16 DIAGNOSIS — I700 Atherosclerosis of aorta: Secondary | ICD-10-CM

## 2016-08-16 MED ORDER — VENLAFAXINE HCL ER 75 MG PO CP24: 75.0000 mg | ORAL_CAPSULE | Freq: Every day | ORAL | 3 refills | Status: DC

## 2016-08-16 NOTE — Patient Instructions (Signed)
Great to see you. To wean down dose of Effexor-  150 mg every other night for two weeks- and the nights that you arent taking the 150 mg dose, take 75 mg dose. Then take 75 mg dose daily after two weeks.  Then keep me updated.

## 2016-08-16 NOTE — Progress Notes (Signed)
Pre visit review using our clinic review tool, if applicable. No additional management support is needed unless otherwise documented below in the visit note. 

## 2016-08-16 NOTE — Progress Notes (Signed)
Subjective:   Patient ID: Christy Freeman, female    DOB: February 06, 1951, 66 y.o.   MRN: [id number]  Christy Freeman is a pleasant 66 y.o. year old female who presents to clinic today with Medicare Wellness (Part 2. Saw Lesia last week. Wants to wean off Effexor. Discuss labs.)  on 08/16/2016  HPI:   Medicare wellness visit with Candis Musa, RN on 08/03/16. Notes reviewed.  Also saw Dr. Rockey Situ on 08/07/16- note reviewed. Zetia added to low dose Crestor.  Advised follow up in 1 year.  BP has been improved.  Depression- symptoms have been well controlled on current dose of Effexor. Denies any symptoms of anxiety or depression.  She would like to try to wean off of Effexor.  She is currently taking 150 mg XL daily.  Lab Results  Component Value Date   CHOL 217 (H) 08/03/2016   HDL 69.80 08/03/2016   LDLCALC 126 (H) 08/03/2016   LDLDIRECT 191.0 07/30/2014   TRIG 108.0 08/03/2016   CHOLHDL 3 08/03/2016   Lab Results  Component Value Date   NA 133 (L) 08/03/2016   K 4.0 08/03/2016   CL 96 08/03/2016   CO2 33 (H) 08/03/2016   Lab Results  Component Value Date   ALT 68 (H) 08/03/2016   AST 80 (H) 08/03/2016   ALKPHOS 117 08/03/2016   BILITOT 0.5 08/03/2016   Lab Results  Component Value Date   CREATININE 0.56 08/03/2016   Lab Results  Component Value Date   TSH 1.82 08/03/2016     Current Outpatient Prescriptions on File Prior to Visit  Medication Sig Dispense Refill  . amLODipine (NORVASC) 10 MG tablet take 1 tablet by mouth once daily 30 tablet 3  . aspirin EC 81 MG tablet Take 81 mg by mouth 2 (two) times daily.    Marland Kitchen CALCIUM-MAGNESIUM PO Take by mouth.    . Coenzyme Q10 (COQ10) 100 MG CAPS Take 1 capsule by mouth 2 (two) times daily.    Marland Kitchen ezetimibe (ZETIA) 10 MG tablet Take 1 tablet (10 mg total) by mouth daily. 90 tablet 3  . lisinopril (PRINIVIL,ZESTRIL) 40 MG tablet take 1 tablet by mouth once daily NEED OFFICE VISIT. 90 tablet 3  . metoprolol succinate (TOPROL-XL)  100 MG 24 hr tablet take 1 tablet by mouth once daily 30 tablet 11  . Omega 3 1000 MG CAPS Take 1 capsule by mouth daily.     Marland Kitchen omeprazole (PRILOSEC) 10 MG capsule Take 1 capsule (10 mg total) by mouth daily. 90 capsule 3  . Probiotic Product (PROBIOTIC DAILY PO) Take 1 capsule by mouth as needed.     . rosuvastatin (CRESTOR) 5 MG tablet take 1 tablet by mouth at bedtime 30 tablet 3   No current facility-administered medications on file prior to visit.     No Known Allergies  Past Medical History:  Diagnosis Date  . Alcohol abuse   . Blood transfusion without reported diagnosis   . Cancer (HCC)    basil cell on face  . Colon polyps   . GERD (gastroesophageal reflux disease)   . H/O: depression   . Hypercholesterolemia    diet controlled  . Hypertension   . Lung nodule    left lung   . Thyroid disease     Past Surgical History:  Procedure Laterality Date  . COLONOSCOPY    . CYST REMOVAL HAND Left   . DILATION AND CURETTAGE OF UTERUS      Family History  Problem Relation Age of Onset  . Diabetes Mother   . Heart failure Mother   . Hypertension Mother   . Stroke Father   . Hypertension Father   . Heart disease Brother   . Stroke Maternal Grandmother   . Colon cancer Neg Hx     Social History   Social History  . Marital status: Married    Spouse name: N/A  . Number of children: N/A  . Years of education: N/A   Occupational History  . Not on file.   Social History Main Topics  . Smoking status: Never Smoker  . Smokeless tobacco: Never Used  . Alcohol use 8.4 oz/week    8 Standard drinks or equivalent, 6 Glasses of wine per week  . Drug use: No  . Sexual activity: Yes   Other Topics Concern  . Not on file   Social History Narrative   She would desire CPR.   Does have a living will.   The PMH, PSH, Social History, Family History, Medications, and allergies have been reviewed in [facility], and have been updated if relevant.   Review of Systems    Constitutional: Negative.   HENT: Negative.   Eyes: Negative.   Respiratory: Negative.   Cardiovascular: Negative.   Gastrointestinal: Negative.   Endocrine: Negative.   Genitourinary: Negative.   Musculoskeletal: Negative.   Skin: Negative.   Allergic/Immunologic: Negative.   Neurological: Negative.   Hematological: Negative.   Psychiatric/Behavioral: Negative.   All other systems reviewed and are negative.      Objective:    BP 140/74 (BP Location: Left Arm, Patient Position: Sitting, Cuff Size: Large)   Pulse 85   Temp 98.6 F (37 C) (Oral)   Wt 193 lb (87.5 kg)   SpO2 95%   BMI 33.13 kg/m    Physical Exam   General:  Well-developed,well-nourished,in no acute distress; alert,appropriate and cooperative throughout examination Head:  normocephalic and atraumatic.   Eyes:  vision grossly intact, PERRL Ears:  R ear normal and L ear normal externally, TMs clear bilaterally Nose:  no external deformity.   Mouth:  good dentition.   Neck:  No deformities, masses, or tenderness noted. Lungs:  Normal respiratory effort, chest expands symmetrically. Lungs are clear to auscultation, no crackles or wheezes. Heart:  Normal rate and regular rhythm. S1 and S2 normal without gallop, murmur, click, rub or other extra sounds. Abdomen:  Bowel sounds positive,abdomen soft and non-tender without masses, organomegaly or hernias noted. Msk:  No deformity or scoliosis noted of thoracic or lumbar spine.   Extremities:  No clubbing, cyanosis, edema, or deformity noted with normal full range of motion of all joints.   Neurologic:  alert & oriented X3 and gait normal.   Skin:  Intact without suspicious lesions or rashes Psych:  Cognition and judgment appear intact. Alert and cooperative with normal attention span and concentration. No apparent delusions, illusions, hallucinations     Assessment & Plan:   Well woman exam  Hyperlipidemia, unspecified hyperlipidemia type  Depression,  unspecified depression type  Aortic atherosclerosis ([place])  Essential hypertension No Follow-up on file.

## 2016-08-16 NOTE — Assessment & Plan Note (Signed)
Well controlled on current rxs.

## 2016-08-16 NOTE — Assessment & Plan Note (Signed)
Weaning down effexor- see AVS for details.

## 2016-08-17 NOTE — Assessment & Plan Note (Signed)
Improved with added zetia. No changes made to rxs.

## 2016-08-17 NOTE — Assessment & Plan Note (Signed)
Reviewed preventive care protocols, scheduled due services, and updated immunizations Discussed nutrition, exercise, diet, and healthy lifestyle.  

## 2016-08-23 ENCOUNTER — Other Ambulatory Visit: Payer: Self-pay | Admitting: Family Medicine

## 2016-08-31 DIAGNOSIS — M19079 Primary osteoarthritis, unspecified ankle and foot: Secondary | ICD-10-CM | POA: Diagnosis not present

## 2016-09-14 ENCOUNTER — Other Ambulatory Visit: Payer: Self-pay | Admitting: Family Medicine

## 2016-09-14 DIAGNOSIS — L910 Hypertrophic scar: Secondary | ICD-10-CM | POA: Diagnosis not present

## 2016-09-14 DIAGNOSIS — L570 Actinic keratosis: Secondary | ICD-10-CM | POA: Diagnosis not present

## 2016-09-20 DIAGNOSIS — H2513 Age-related nuclear cataract, bilateral: Secondary | ICD-10-CM | POA: Diagnosis not present

## 2016-10-31 ENCOUNTER — Other Ambulatory Visit: Payer: Self-pay | Admitting: Cardiovascular Disease

## 2016-11-25 ENCOUNTER — Other Ambulatory Visit: Payer: Self-pay | Admitting: Cardiovascular Disease

## 2017-02-13 DIAGNOSIS — H532 Diplopia: Secondary | ICD-10-CM | POA: Diagnosis not present

## 2017-03-09 DIAGNOSIS — H43813 Vitreous degeneration, bilateral: Secondary | ICD-10-CM | POA: Diagnosis not present

## 2017-03-20 ENCOUNTER — Other Ambulatory Visit: Payer: Self-pay | Admitting: Family Medicine

## 2017-03-20 DIAGNOSIS — Z1231 Encounter for screening mammogram for malignant neoplasm of breast: Secondary | ICD-10-CM

## 2017-04-10 ENCOUNTER — Other Ambulatory Visit: Payer: Self-pay

## 2017-04-10 ENCOUNTER — Telehealth: Payer: Self-pay | Admitting: Cardiovascular Disease

## 2017-04-10 MED ORDER — AMLODIPINE BESYLATE 10 MG PO TABS: 10.0000 mg | ORAL_TABLET | Freq: Every day | ORAL | 3 refills | Status: DC

## 2017-04-10 NOTE — Telephone Encounter (Signed)
°*  STAT* If patient is at the pharmacy, call can be transferred to refill team.   1. Which medications need to be refilled? (please list name of each medication and dose if known)  amlodipine   2. Which pharmacy/location (including street and city if local pharmacy) is medication to be sent to? [facility] in [place] ([facility] to be [facility])  3. Do they need a 30 day or 90 day supply? 90 day

## 2017-04-13 MED ORDER — AMLODIPINE BESYLATE 10 MG PO TABS: 10.0000 mg | ORAL_TABLET | Freq: Every day | ORAL | 3 refills | Status: DC

## 2017-04-13 NOTE — Telephone Encounter (Signed)
Patient spouse called to speak to the office manager re: difficulties getting medications from the office refilled   After talking with patient spouse previous request was incorrect   Patient uses [place] and will need 30 day refills on all future medication refills   Please resend to [address] today as patient is out of medications  After resolving issue with refill patient spouse wants to cancel request to speak with office manager

## 2017-04-13 NOTE — Telephone Encounter (Signed)
Spoke with pharmacist at [facility] on [address] the refill is okay for Amlodipine 10 mg one tablet daily.

## 2017-05-25 ENCOUNTER — Ambulatory Visit
Admission: RE | Admit: 2017-05-25 | Discharge: 2017-05-25 | Disposition: A | Discharge status: Home / Self Care | Payer: PPO | Source: Ambulatory Visit | Attending: Family Medicine | Admitting: Family Medicine

## 2017-05-25 DIAGNOSIS — Z1231 Encounter for screening mammogram for malignant neoplasm of breast: Secondary | ICD-10-CM

## 2017-05-30 ENCOUNTER — Other Ambulatory Visit: Payer: Self-pay | Admitting: Family Medicine

## 2017-05-30 DIAGNOSIS — R928 Other abnormal and inconclusive findings on diagnostic imaging of breast: Secondary | ICD-10-CM

## 2017-06-04 ENCOUNTER — Ambulatory Visit
Admission: RE | Admit: 2017-06-04 | Discharge: 2017-06-04 | Disposition: A | Discharge status: Home / Self Care | Payer: PPO | Source: Ambulatory Visit | Attending: Family Medicine | Admitting: Family Medicine

## 2017-06-04 ENCOUNTER — Other Ambulatory Visit: Payer: Self-pay | Admitting: Family Medicine

## 2017-06-04 DIAGNOSIS — N6489 Other specified disorders of breast: Secondary | ICD-10-CM | POA: Diagnosis not present

## 2017-06-04 DIAGNOSIS — R928 Other abnormal and inconclusive findings on diagnostic imaging of breast: Secondary | ICD-10-CM

## 2017-06-04 DIAGNOSIS — R922 Inconclusive mammogram: Secondary | ICD-10-CM | POA: Diagnosis not present

## 2017-06-04 DIAGNOSIS — N6310 Unspecified lump in the right breast, unspecified quadrant: Secondary | ICD-10-CM

## 2017-06-14 ENCOUNTER — Observation Stay
Admission: EM | Admit: 2017-06-14 | Discharge: 2017-06-15 | Disposition: A | Discharge status: Home / Self Care | Payer: PPO | Attending: General Surgery | Admitting: General Surgery

## 2017-06-14 ENCOUNTER — Emergency Department: Payer: PPO

## 2017-06-14 ENCOUNTER — Encounter: Payer: Self-pay | Admitting: Emergency Medicine

## 2017-06-14 ENCOUNTER — Other Ambulatory Visit: Payer: Self-pay

## 2017-06-14 ENCOUNTER — Observation Stay: Payer: PPO | Admitting: Anesthesiology

## 2017-06-14 ENCOUNTER — Encounter
Admission: EM | Disposition: A | Discharge status: Home / Self Care | Payer: Self-pay | Source: Home / Self Care | Attending: Emergency Medicine

## 2017-06-14 DIAGNOSIS — K219 Gastro-esophageal reflux disease without esophagitis: Secondary | ICD-10-CM | POA: Diagnosis not present

## 2017-06-14 DIAGNOSIS — Z6831 Body mass index (BMI) 31.0-31.9, adult: Secondary | ICD-10-CM | POA: Diagnosis not present

## 2017-06-14 DIAGNOSIS — E785 Hyperlipidemia, unspecified: Secondary | ICD-10-CM | POA: Insufficient documentation

## 2017-06-14 DIAGNOSIS — K3580 Unspecified acute appendicitis: Secondary | ICD-10-CM | POA: Diagnosis not present

## 2017-06-14 DIAGNOSIS — F329 Major depressive disorder, single episode, unspecified: Secondary | ICD-10-CM | POA: Insufficient documentation

## 2017-06-14 DIAGNOSIS — E079 Disorder of thyroid, unspecified: Secondary | ICD-10-CM | POA: Insufficient documentation

## 2017-06-14 DIAGNOSIS — Z7982 Long term (current) use of aspirin: Secondary | ICD-10-CM | POA: Insufficient documentation

## 2017-06-14 DIAGNOSIS — Z79899 Other long term (current) drug therapy: Secondary | ICD-10-CM | POA: Diagnosis not present

## 2017-06-14 DIAGNOSIS — K37 Unspecified appendicitis: Secondary | ICD-10-CM | POA: Diagnosis not present

## 2017-06-14 DIAGNOSIS — I10 Essential (primary) hypertension: Secondary | ICD-10-CM | POA: Insufficient documentation

## 2017-06-14 DIAGNOSIS — Z86010 Personal history of colonic polyps: Secondary | ICD-10-CM | POA: Insufficient documentation

## 2017-06-14 DIAGNOSIS — Z85828 Personal history of other malignant neoplasm of skin: Secondary | ICD-10-CM | POA: Insufficient documentation

## 2017-06-14 DIAGNOSIS — K353 Acute appendicitis with localized peritonitis, without perforation or gangrene: Secondary | ICD-10-CM | POA: Diagnosis not present

## 2017-06-14 DIAGNOSIS — E669 Obesity, unspecified: Secondary | ICD-10-CM | POA: Diagnosis not present

## 2017-06-14 DIAGNOSIS — E7800 Pure hypercholesterolemia, unspecified: Secondary | ICD-10-CM | POA: Insufficient documentation

## 2017-06-14 DIAGNOSIS — R1084 Generalized abdominal pain: Secondary | ICD-10-CM | POA: Diagnosis not present

## 2017-06-14 DIAGNOSIS — R1031 Right lower quadrant pain: Secondary | ICD-10-CM | POA: Diagnosis not present

## 2017-06-14 DIAGNOSIS — R197 Diarrhea, unspecified: Secondary | ICD-10-CM | POA: Insufficient documentation

## 2017-06-14 HISTORY — PX: LAPAROSCOPIC APPENDECTOMY: SHX408

## 2017-06-14 HISTORY — PX: APPENDECTOMY: SHX54

## 2017-06-14 LAB — URINALYSIS, COMPLETE (UACMP) WITH MICROSCOPIC
BACTERIA UA: NONE SEEN
Glucose, UA: NEGATIVE mg/dL
Ketones, ur: 40 mg/dL — AB
Leukocytes, UA: NEGATIVE
Nitrite: NEGATIVE
PH: 6.5 (ref 5.0–8.0)
Protein, ur: 100 mg/dL — AB

## 2017-06-14 LAB — COMPREHENSIVE METABOLIC PANEL
ALBUMIN: 4.8 g/dL (ref 3.5–5.0)
ALT: 34 U/L (ref 14–54)
AST: 27 U/L (ref 15–41)
Alkaline Phosphatase: 137 U/L — ABNORMAL HIGH (ref 38–126)
Anion gap: 12 (ref 5–15)
BILIRUBIN TOTAL: 1.3 mg/dL — AB (ref 0.3–1.2)
BUN: 8 mg/dL (ref 6–20)
CHLORIDE: 90 mmol/L — AB (ref 101–111)
CO2: 25 mmol/L (ref 22–32)
CREATININE: 0.53 mg/dL (ref 0.44–1.00)
Calcium: 9.4 mg/dL (ref 8.9–10.3)
GFR calc Af Amer: >60 mL/min (ref >60)
GFR calc non Af Amer: >60 mL/min (ref >60)
GLUCOSE: 114 mg/dL — AB (ref 65–99)
Potassium: 3.4 mmol/L — ABNORMAL LOW (ref 3.5–5.1)
Sodium: 127 mmol/L — ABNORMAL LOW (ref 135–145)
TOTAL PROTEIN: 9.2 g/dL — AB (ref 6.5–8.1)

## 2017-06-14 LAB — CBC
HEMATOCRIT: 44.9 % (ref 35.0–47.0)
Hemoglobin: 15.2 g/dL (ref 12.0–16.0)
MCH: 30.2 pg (ref 26.0–34.0)
MCHC: 33.9 g/dL (ref 32.0–36.0)
MCV: 89.2 fL (ref 80.0–100.0)
PLATELETS: 312 10*3/uL (ref 150–440)
RBC: 5.03 MIL/uL (ref 3.80–5.20)
RDW: 13.3 % (ref 11.5–14.5)
WBC: 16.7 10*3/uL — ABNORMAL HIGH (ref 3.6–11.0)

## 2017-06-14 LAB — LIPASE, BLOOD: Lipase: 24 U/L (ref 11–51)

## 2017-06-14 SURGERY — APPENDECTOMY, LAPAROSCOPIC
Anesthesia: General

## 2017-06-14 MED ORDER — LIDOCAINE HCL (CARDIAC) 20 MG/ML IV SOLN
INTRAVENOUS | Status: DC | PRN
  Administered 2017-06-14: 100 mg via INTRAVENOUS

## 2017-06-14 MED ORDER — MORPHINE SULFATE (PF) 4 MG/ML IV SOLN
4.0000 mg | INTRAVENOUS | Status: DC | PRN
  Filled 2017-06-14: qty 1

## 2017-06-14 MED ORDER — AMLODIPINE BESYLATE 10 MG PO TABS
10.0000 mg | ORAL_TABLET | Freq: Every day | ORAL | Status: DC
  Administered 2017-06-15: 10 mg via ORAL
  Filled 2017-06-14: qty 1

## 2017-06-14 MED ORDER — ONDANSETRON HCL 4 MG/2ML IJ SOLN
INTRAMUSCULAR | Status: DC | PRN
  Administered 2017-06-14: 4 mg via INTRAVENOUS

## 2017-06-14 MED ORDER — DIPHENHYDRAMINE HCL 50 MG/ML IJ SOLN: 12.5000 mg | Freq: Four times a day (QID) | INTRAMUSCULAR | Status: DC | PRN

## 2017-06-14 MED ORDER — LACTATED RINGERS IV SOLN
INTRAVENOUS | Status: DC | PRN
  Administered 2017-06-14: 16:00:00 via INTRAVENOUS

## 2017-06-14 MED ORDER — OXYCODONE HCL 5 MG/5ML PO SOLN: 5.0000 mg | Freq: Once | ORAL | Status: DC | PRN

## 2017-06-14 MED ORDER — MIDAZOLAM HCL 2 MG/2ML IJ SOLN
INTRAMUSCULAR | Status: DC | PRN
  Administered 2017-06-14: 2 mg via INTRAVENOUS

## 2017-06-14 MED ORDER — FENTANYL CITRATE (PF) 100 MCG/2ML IJ SOLN
INTRAMUSCULAR | Status: AC
  Filled 2017-06-14: qty 2

## 2017-06-14 MED ORDER — FENTANYL CITRATE (PF) 100 MCG/2ML IJ SOLN
INTRAMUSCULAR | Status: AC
  Administered 2017-06-14: 50 ug via INTRAVENOUS
  Filled 2017-06-14: qty 2

## 2017-06-14 MED ORDER — SUCCINYLCHOLINE CHLORIDE 20 MG/ML IJ SOLN
INTRAMUSCULAR | Status: DC | PRN
  Administered 2017-06-14: 100 mg via INTRAVENOUS

## 2017-06-14 MED ORDER — SUGAMMADEX SODIUM 200 MG/2ML IV SOLN
INTRAVENOUS | Status: DC | PRN
  Administered 2017-06-14: 165.8 mg via INTRAVENOUS

## 2017-06-14 MED ORDER — ROCURONIUM BROMIDE 100 MG/10ML IV SOLN
INTRAVENOUS | Status: DC | PRN
  Administered 2017-06-14: 50 mg via INTRAVENOUS

## 2017-06-14 MED ORDER — ONDANSETRON 4 MG PO TBDP: 4.0000 mg | ORAL_TABLET | Freq: Four times a day (QID) | ORAL | Status: DC | PRN

## 2017-06-14 MED ORDER — SODIUM CHLORIDE 0.9 % IV SOLN
INTRAVENOUS | Status: DC
  Administered 2017-06-14 – 2017-06-15 (×3): via INTRAVENOUS

## 2017-06-14 MED ORDER — DIPHENHYDRAMINE HCL 12.5 MG/5ML PO ELIX
12.5000 mg | ORAL_SOLUTION | Freq: Four times a day (QID) | ORAL | Status: DC | PRN
  Filled 2017-06-14: qty 5

## 2017-06-14 MED ORDER — PIPERACILLIN-TAZOBACTAM 3.375 G IVPB
3.3750 g | Freq: Three times a day (TID) | INTRAVENOUS | Status: DC
  Administered 2017-06-14 – 2017-06-15 (×3): 3.375 g via INTRAVENOUS
  Filled 2017-06-14 (×3): qty 50

## 2017-06-14 MED ORDER — ACETAMINOPHEN 500 MG PO TABS
ORAL_TABLET | ORAL | Status: AC
  Filled 2017-06-14: qty 2

## 2017-06-14 MED ORDER — METOPROLOL TARTRATE 5 MG/5ML IV SOLN: 5.0000 mg | Freq: Four times a day (QID) | INTRAVENOUS | Status: DC | PRN

## 2017-06-14 MED ORDER — IOPAMIDOL (ISOVUE-300) INJECTION 61%
100.0000 mL | Freq: Once | INTRAVENOUS | Status: AC | PRN
  Administered 2017-06-14: 100 mL via INTRAVENOUS

## 2017-06-14 MED ORDER — PROPOFOL 10 MG/ML IV BOLUS
INTRAVENOUS | Status: DC | PRN
  Administered 2017-06-14: 150 mg via INTRAVENOUS

## 2017-06-14 MED ORDER — PHENYLEPHRINE HCL 10 MG/ML IJ SOLN
INTRAMUSCULAR | Status: DC | PRN
  Administered 2017-06-14: 200 ug via INTRAVENOUS

## 2017-06-14 MED ORDER — VENLAFAXINE HCL ER 75 MG PO CP24
75.0000 mg | ORAL_CAPSULE | Freq: Every day | ORAL | Status: DC
  Filled 2017-06-14: qty 1

## 2017-06-14 MED ORDER — EPHEDRINE SULFATE 50 MG/ML IJ SOLN
INTRAMUSCULAR | Status: DC | PRN
Start: 2017-06-14 — End: 2017-06-14

## 2017-06-14 MED ORDER — FENTANYL CITRATE (PF) 100 MCG/2ML IJ SOLN
25.0000 ug | INTRAMUSCULAR | Status: DC | PRN
  Administered 2017-06-14 (×3): 50 ug via INTRAVENOUS

## 2017-06-14 MED ORDER — LIDOCAINE HCL 1 % IJ SOLN
INTRAMUSCULAR | Status: DC | PRN
  Administered 2017-06-14: 10 mL

## 2017-06-14 MED ORDER — BUPIVACAINE-EPINEPHRINE (PF) 0.25% -1:200000 IJ SOLN
INTRAMUSCULAR | Status: DC | PRN
  Administered 2017-06-14: 10 mL

## 2017-06-14 MED ORDER — METOPROLOL SUCCINATE ER 100 MG PO TB24
100.0000 mg | ORAL_TABLET | Freq: Every day | ORAL | Status: DC
  Administered 2017-06-15: 100 mg via ORAL
  Filled 2017-06-14: qty 1

## 2017-06-14 MED ORDER — FENTANYL CITRATE (PF) 100 MCG/2ML IJ SOLN
INTRAMUSCULAR | Status: DC | PRN
  Administered 2017-06-14: 100 ug via INTRAVENOUS

## 2017-06-14 MED ORDER — ACETAMINOPHEN 500 MG PO TABS
1000.0000 mg | ORAL_TABLET | Freq: Once | ORAL | Status: AC
  Administered 2017-06-14: 1000 mg via ORAL

## 2017-06-14 MED ORDER — PANTOPRAZOLE SODIUM 40 MG IV SOLR
40.0000 mg | Freq: Every day | INTRAVENOUS | Status: DC
  Administered 2017-06-14: 40 mg via INTRAVENOUS
  Filled 2017-06-14: qty 40

## 2017-06-14 MED ORDER — DEXAMETHASONE SODIUM PHOSPHATE 10 MG/ML IJ SOLN
INTRAMUSCULAR | Status: DC | PRN
  Administered 2017-06-14: 10 mg via INTRAVENOUS

## 2017-06-14 MED ORDER — HYDRALAZINE HCL 20 MG/ML IJ SOLN: 10.0000 mg | INTRAMUSCULAR | Status: DC | PRN

## 2017-06-14 MED ORDER — ONDANSETRON HCL 4 MG/2ML IJ SOLN: 4.0000 mg | Freq: Four times a day (QID) | INTRAMUSCULAR | Status: DC | PRN

## 2017-06-14 MED ORDER — OXYCODONE HCL 5 MG PO TABS: 5.0000 mg | ORAL_TABLET | Freq: Once | ORAL | Status: DC | PRN

## 2017-06-14 MED ORDER — SODIUM CHLORIDE 0.9 % IV BOLUS (SEPSIS)
1000.0000 mL | Freq: Once | INTRAVENOUS | Status: AC
  Administered 2017-06-14: 1000 mL via INTRAVENOUS

## 2017-06-14 MED ORDER — LISINOPRIL 20 MG PO TABS
40.0000 mg | ORAL_TABLET | Freq: Every day | ORAL | Status: DC
  Administered 2017-06-15: 40 mg via ORAL
  Filled 2017-06-14: qty 2

## 2017-06-14 SURGICAL SUPPLY — 42 items
ADHESIVE MASTISOL STRL (MISCELLANEOUS) ×3 IMPLANT
APPLIER CLIP 5 13 M/L LIGAMAX5 (MISCELLANEOUS)
BLADE SURG SZ11 CARB STEEL (BLADE) ×3 IMPLANT
BULB RESERV EVAC DRAIN JP 100C (MISCELLANEOUS) IMPLANT
CANISTER SUCT 1200ML W/VALVE (MISCELLANEOUS) ×3 IMPLANT
CHLORAPREP W/TINT 26ML (MISCELLANEOUS) ×3 IMPLANT
CLIP APPLIE 5 13 M/L LIGAMAX5 (MISCELLANEOUS) IMPLANT
CLOSURE WOUND 1/2 X4 (GAUZE/BANDAGES/DRESSINGS) ×1
CUTTER FLEX LINEAR 45M (STAPLE) ×3 IMPLANT
DRAIN CHANNEL JP 19F (MISCELLANEOUS) IMPLANT
DRSG TEGADERM 2-3/8X2-3/4 SM (GAUZE/BANDAGES/DRESSINGS) ×9 IMPLANT
DRSG TELFA 4X3 1S NADH ST (GAUZE/BANDAGES/DRESSINGS) ×3 IMPLANT
ELECT REM PT RETURN 9FT ADLT (ELECTROSURGICAL) ×3
ELECTRODE REM PT RTRN 9FT ADLT (ELECTROSURGICAL) ×1 IMPLANT
GLOVE BIO SURGEON STRL SZ7.5 (GLOVE) ×3 IMPLANT
GLOVE INDICATOR 8.0 STRL GRN (GLOVE) ×3 IMPLANT
GOWN STRL REUS W/ TWL LRG LVL3 (GOWN DISPOSABLE) ×2 IMPLANT
GOWN STRL REUS W/TWL LRG LVL3 (GOWN DISPOSABLE) ×4
IRRIGATION STRYKERFLOW (MISCELLANEOUS) ×1 IMPLANT
IRRIGATOR STRYKERFLOW (MISCELLANEOUS) ×3
IV NS 1000ML (IV SOLUTION) ×2
IV NS 1000ML BAXH (IV SOLUTION) ×1 IMPLANT
KIT RM TURNOVER STRD PROC AR (KITS) ×3 IMPLANT
LABEL OR SOLS (LABEL) ×3 IMPLANT
NEEDLE HYPO 25X1 1.5 SAFETY (NEEDLE) ×3 IMPLANT
NEEDLE VERESS 14GA 120MM (NEEDLE) ×3 IMPLANT
NS IRRIG 500ML POUR BTL (IV SOLUTION) ×3 IMPLANT
PACK LAP CHOLECYSTECTOMY (MISCELLANEOUS) ×3 IMPLANT
POUCH SPECIMEN RETRIEVAL 10MM (ENDOMECHANICALS) ×3 IMPLANT
RELOAD 45 VASCULAR/THIN (ENDOMECHANICALS) IMPLANT
RELOAD STAPLE TA45 3.5 REG BLU (ENDOMECHANICALS) ×3 IMPLANT
SCALPEL HARMONIC ACE (MISCELLANEOUS) ×3 IMPLANT
SLEEVE ENDOPATH XCEL 5M (ENDOMECHANICALS) ×3 IMPLANT
STRIP CLOSURE SKIN 1/2X4 (GAUZE/BANDAGES/DRESSINGS) ×2 IMPLANT
SUT MNCRL 4-0 (SUTURE) ×2
SUT MNCRL 4-0 27XMFL (SUTURE) ×1
SUT VICRYL 0 UR6 27IN ABS (SUTURE) IMPLANT
SUTURE MNCRL 4-0 27XMF (SUTURE) ×1 IMPLANT
TRAY FOLEY W/METER SILVER 16FR (SET/KITS/TRAYS/PACK) ×3 IMPLANT
TROCAR XCEL 12X100 BLDLESS (ENDOMECHANICALS) ×3 IMPLANT
TROCAR XCEL NON-BLD 5MMX100MML (ENDOMECHANICALS) ×3 IMPLANT
TUBING INSUFFLATION (TUBING) ×3 IMPLANT

## 2017-06-14 NOTE — ED Provider Notes (Signed)
[facility] Emergency Department Provider Note ____________________________________________   I have reviewed the triage vital signs and the triage nursing note.  HISTORY  Chief Complaint Abdominal Pain and Fever   Historian Patient  HPI Christy Freeman is a 67 y.o. female presenting for abdominal pain x 1 day.  Watery, non bloody diarrhea overnight, multiple times.  Saw [place] [facility] this AM and referred to ED for further evaluation and possible rule out appendicitis.  Ear temp 100 at home.  Pain moderate currently 7/10, does not want pain medications right now.  +nausea without vomiting.  Abd pain started mid abdomen, now moreso lower and rlq.    Past Medical History:  Diagnosis Date  . Alcohol abuse   . Blood transfusion without reported diagnosis   . Cancer (HCC)    basil cell on face  . Colon polyps   . GERD (gastroesophageal reflux disease)   . H/O: depression   . Hypercholesterolemia    diet controlled  . Hypertension   . Lung nodule    left lung   . Thyroid disease     Patient Active Problem List   Diagnosis Date Noted  . Well woman exam 08/02/2016  . Aortic atherosclerosis ([place]) 02/10/2015  . Endometrial thickening on ultra sound 09/16/2014  . Leukocytosis 09/09/2014  . Postmenopausal HRT (hormone replacement therapy) 05/12/2014  . Solitary pulmonary nodule 10/13/2013  . Obesity 09/19/2013  . Steatohepatitis 09/19/2013  . Hypertension 02/05/2013  . GERD (gastroesophageal reflux disease) 02/05/2013  . Hyperlipidemia 02/05/2013  . Depression 02/05/2013    Past Surgical History:  Procedure Laterality Date  . COLONOSCOPY    . CYST REMOVAL HAND Left   . DILATION AND CURETTAGE OF UTERUS      Prior to Admission medications   Medication Sig Start Date End Date Taking? Authorizing Provider  amLODipine (NORVASC) 10 MG tablet Take 1 tablet (10 mg total) daily by mouth. 04/13/17  Yes Minna Merritts, MD  aspirin EC 81 MG  tablet Take 81 mg by mouth 2 (two) times daily.   Yes [provider]  lisinopril (PRINIVIL,ZESTRIL) 40 MG tablet take 1 tablet by mouth once daily 09/14/16  Yes Lucille Passy, MD  metoprolol succinate (TOPROL-XL) 100 MG 24 hr tablet take 1 tablet by mouth once daily 08/24/16  Yes Lucille Passy, MD  omeprazole (PRILOSEC) 10 MG capsule Take 1 capsule (10 mg total) by mouth daily. 08/14/16  Yes Lucille Passy, MD  rosuvastatin (CRESTOR) 5 MG tablet take 1 tablet by mouth at bedtime 10/31/16  Yes Minna Merritts, MD  venlafaxine XR (EFFEXOR XR) 75 MG 24 hr capsule Take 1 capsule (75 mg total) by mouth daily with breakfast. 08/16/16  Yes Lucille Passy, MD  ezetimibe (ZETIA) 10 MG tablet Take 1 tablet (10 mg total) by mouth daily. Patient not taking: Reported on 06/14/2017 08/07/16 08/07/17  Minna Merritts, MD  Probiotic Product (PROBIOTIC DAILY PO) Take 1 capsule by mouth as needed.     [provider]    No Known Allergies  Family History  Problem Relation Age of Onset  . Diabetes Mother   . Heart failure Mother   . Hypertension Mother   . Stroke Father   . Hypertension Father   . Heart disease Brother   . Stroke Maternal Grandmother   . Colon cancer Neg Hx     Social History Social History   Tobacco Use  . Smoking status: Never Smoker  . Smokeless tobacco:  Never Used  Substance Use Topics  . Alcohol use: Yes    Alcohol/week: 8.4 oz    Types: 8 Standard drinks or equivalent, 6 Glasses of wine per week  . Drug use: No    Review of Systems  Constitutional: Positive for low grade fever. Eyes: Negative for visual changes. ENT: Negative for sore throat. Cardiovascular: Negative for chest pain. Respiratory: Negative for shortness of breath. Gastrointestinal: As per HPI. Genitourinary: Negative for dysuria. Musculoskeletal: Negative for back pain. Skin: Negative for rash. Neurological: Negative for  headache.  ____________________________________________   PHYSICAL EXAM:  VITAL SIGNS: ED Triage Vitals [06/14/17 1016]  Enc Vitals Group     BP (!) 153/129     Pulse Rate 92     Resp 18     Temp 100 F (37.8 C)     Temp Source Oral     SpO2 97 %     Weight 193 lb (87.5 kg)     Height 5' 4"  (1.626 m)     Head Circumference      Peak Flow      Pain Score 7     Pain Loc      Pain Edu?      Excl. in [place]?      Constitutional: Alert and oriented. Well appearing and in no distress. HEENT   Head: Normocephalic and atraumatic.      Eyes: Conjunctivae are normal. Pupils equal and round.       Ears:         Nose: No congestion/rhinnorhea.   Mouth/Throat: Mucous membranes are moist.   Neck: No stridor. Cardiovascular/Chest: Normal rate, regular rhythm.  No murmurs, rubs, or gallops. Respiratory: Normal respiratory effort without tachypnea nor retractions. Breath sounds are clear and equal bilaterally. No wheezes/rales/rhonchi. Gastrointestinal: Soft. No distention, no guarding, no rebound. Moderate mid and lower abd pain with rlq tenderness.  Genitourinary/rectal:Deferred Musculoskeletal: Nontender with normal range of motion in all extremities. No joint effusions.  No lower extremity tenderness.  No edema. Neurologic:  Normal speech and language. No gross or focal neurologic deficits are appreciated. Skin:  Skin is warm, dry and intact. No rash noted. Psychiatric: Mood and affect are normal. Speech and behavior are normal. Patient exhibits appropriate insight and judgment.   ____________________________________________  LABS (pertinent positives/negatives) I, Lisa Roca, MD the attending physician have reviewed the labs noted below.  Labs Reviewed  COMPREHENSIVE METABOLIC PANEL - Abnormal; Notable for the following components:      Result Value   Sodium 127 (*)    Potassium 3.4 (*)    Chloride 90 (*)    Glucose, Bld 114 (*)    Total Protein 9.2 (*)     Alkaline Phosphatase 137 (*)    Total Bilirubin 1.3 (*)    All other components within normal limits  CBC - Abnormal; Notable for the following components:   WBC 16.7 (*)    All other components within normal limits  URINALYSIS, COMPLETE (UACMP) WITH MICROSCOPIC - Abnormal; Notable for the following components:   Specific Gravity, Urine >1.030 (*)    Hgb urine dipstick TRACE (*)    Bilirubin Urine SMALL (*)    Ketones, ur 40 (*)    Protein, ur 100 (*)    Squamous Epithelial / LPF 0-5 (*)    All other components within normal limits  LIPASE, BLOOD    ____________________________________________    EKG I, Lisa Roca, MD, the attending physician have personally viewed and interpreted all ECGs.  None ____________________________________________  RADIOLOGY All Xrays were viewed by me.  Imaging interpreted by Radiologist, and I, Lisa Roca, MD the attending physician have reviewed the radiologist interpretation noted below.  CT abdomen pelvis with contrast:  IMPRESSION: Changes consistent with acute appendicitis with central appendicolith and mild periappendiceal inflammatory change. No definitive abscess is noted. __________________________________________  PROCEDURES  Procedure(s) performed: None  Critical Care performed: None   ____________________________________________  ED COURSE / ASSESSMENT AND PLAN  Pertinent labs & imaging results that were available during my care of the patient were reviewed by me and considered in my medical decision making (see chart for details).    Patient initially complained mostly of diarrhea, however on exam her pain is focal down into the right lower quadrant.  This is also the reason why she was sent over here by [facility] clinic, we discussed risk and benefit of CT and chose to proceed.  CT scan is consistent with acute mental status.  I discussed this with patient and her husband.  Patient has not eaten since last night.  I  consulted surgery, Dr. Adonis Huguenin, who will see patient in the ER.  Patient still is declining any pain medication, certainly any narcotic pain medication, she is fearful because her mother got morphine with an intra-abdominal issue and they feel like she passed away because the morphine covered over her symptoms.  DIFFERENTIAL DIAGNOSIS: Differential diagnosis includes, but is not limited to, ovarian cyst, ovarian torsion, acute appendicitis, diverticulitis, urinary tract infection/pyelonephritis, endometriosis, bowel obstruction, colitis, renal colic, gastroenteritis, hernia, fibroids, endometriosis, pregnancy related pain including ectopic pregnancy, etc.  CONSULTATIONS:  None  Patient / Family / Caregiver informed of clinical course, medical decision-making process, and agree with plan.    ___________________________________________   FINAL CLINICAL IMPRESSION(S) / ED DIAGNOSES   Final diagnoses:  Diarrhea, unspecified type  Acute appendicitis, unspecified acute appendicitis type      ___________________________________________        Note: This dictation was prepared with Dragon dictation. Any transcriptional errors that result from this process are unintentional    Lisa Roca, MD 06/14/17 1217

## 2017-06-14 NOTE — Anesthesia Post-op Follow-up Note (Signed)
Anesthesia QCDR form completed.        

## 2017-06-14 NOTE — Anesthesia Postprocedure Evaluation (Signed)
Anesthesia Post Note  Patient: Christy Freeman  Procedure(s) Performed: APPENDECTOMY LAPAROSCOPIC (N/A )  Patient location during evaluation: PACU Anesthesia Type: General Level of consciousness: awake and alert Pain management: pain level controlled Vital Signs Assessment: post-procedure vital signs reviewed and stable Respiratory status: spontaneous breathing, nonlabored ventilation, respiratory function stable and patient connected to nasal cannula oxygen Cardiovascular status: blood pressure returned to baseline and stable Postop Assessment: no apparent nausea or vomiting Anesthetic complications: no     Last Vitals:  Vitals:   06/14/17 1732 06/14/17 1735  BP: (!) 159/76   Pulse: 85 87  Resp: 17 19  Temp:    SpO2: 97% 96%    Last Pain:  Vitals:   06/14/17 1735  TempSrc:   PainSc: 5                  Precious Haws Kolette Vey

## 2017-06-14 NOTE — Brief Op Note (Signed)
06/14/2017  5:10 PM  PATIENT:  Christy Freeman  67 y.o. female  PRE-OPERATIVE DIAGNOSIS:  appendicitis  POST-OPERATIVE DIAGNOSIS:  appendicitis  PROCEDURE:  Procedure(s): APPENDECTOMY LAPAROSCOPIC (N/A)  SURGEON:  Surgeon(s) and Role:    Clayburn Pert, MD - Primary  PHYSICIAN ASSISTANT:   ASSISTANTS: none   ANESTHESIA:   general  EBL:  10 mL   BLOOD ADMINISTERED:none  DRAINS: none   LOCAL MEDICATIONS USED:  MARCAINE   , XYLOCAINE  and Amount: 20 ml  SPECIMEN:  Source of Specimen:  Appendix  DISPOSITION OF SPECIMEN:  PATHOLOGY  COUNTS:  YES  TOURNIQUET:  * No tourniquets in log *  DICTATION: .Dragon Dictation  PLAN OF CARE: Admit for overnight observation  PATIENT DISPOSITION:  PACU - hemodynamically stable.   Delay start of Pharmacological VTE agent (>24hrs) due to surgical blood loss or risk of bleeding: no

## 2017-06-14 NOTE — H&P (Signed)
Patient ID: Christy Freeman, female   DOB: 1950/11/19, 67 y.o.   MRN: [id number]  CC: Abdominal pain  HPI Christy Freeman is a 67 y.o. female who presents emergency department with a 1 day history of abdominal pain.  Patient reports that yesterday afternoon she started having lower abdominal pain that localized to the right lower quadrant.  She is also had multiple loose watery bowel movements.  Denies any fevers, chills, nausea, vomiting, chest pain, shortness of breath.  Last time she ate or had an appetite was yesterday evening.  She is otherwise in her usual state of health.  She denies any recent sick contacts or travels.  HPI  Past Medical History:  Diagnosis Date  . Alcohol abuse   . Blood transfusion without reported diagnosis   . Cancer (HCC)    basil cell on face  . Colon polyps   . GERD (gastroesophageal reflux disease)   . H/O: depression   . Hypercholesterolemia    diet controlled  . Hypertension   . Lung nodule    left lung   . Thyroid disease     Past Surgical History:  Procedure Laterality Date  . COLONOSCOPY    . CYST REMOVAL HAND Left   . DILATION AND CURETTAGE OF UTERUS      Family History  Problem Relation Age of Onset  . Diabetes Mother   . Heart failure Mother   . Hypertension Mother   . Stroke Father   . Hypertension Father   . Heart disease Brother   . Stroke Maternal Grandmother   . Colon cancer Neg Hx     Social History Social History   Tobacco Use  . Smoking status: Never Smoker  . Smokeless tobacco: Never Used  Substance Use Topics  . Alcohol use: Yes    Alcohol/week: 8.4 oz    Types: 8 Standard drinks or equivalent, 6 Glasses of wine per week  . Drug use: No    No Known Allergies  Current Facility-Administered Medications  Medication Dose Route Frequency Provider Last Rate Last Dose  . piperacillin-tazobactam (ZOSYN) IVPB 3.375 g  3.375 g Intravenous Q8H Clayburn Pert, MD       Current Outpatient Medications  Medication Sig  Dispense Refill  . amLODipine (NORVASC) 10 MG tablet Take 1 tablet (10 mg total) daily by mouth. 30 tablet 3  . aspirin EC 81 MG tablet Take 81 mg by mouth 2 (two) times daily.    Marland Kitchen lisinopril (PRINIVIL,ZESTRIL) 40 MG tablet take 1 tablet by mouth once daily 90 tablet 3  . metoprolol succinate (TOPROL-XL) 100 MG 24 hr tablet take 1 tablet by mouth once daily 30 tablet 11  . omeprazole (PRILOSEC) 10 MG capsule Take 1 capsule (10 mg total) by mouth daily. 90 capsule 3  . rosuvastatin (CRESTOR) 5 MG tablet take 1 tablet by mouth at bedtime 30 tablet 3  . venlafaxine XR (EFFEXOR XR) 75 MG 24 hr capsule Take 1 capsule (75 mg total) by mouth daily with breakfast. 30 capsule 3  . ezetimibe (ZETIA) 10 MG tablet Take 1 tablet (10 mg total) by mouth daily. (Patient not taking: Reported on 06/14/2017) 90 tablet 3  . Probiotic Product (PROBIOTIC DAILY PO) Take 1 capsule by mouth as needed.        Review of Systems A multi-point review of systems was asked and was negative except for the findings documented in the HPI  Physical Exam Blood pressure (!) 153/129, pulse 92, temperature 100 F (37.8 C),  temperature source Oral, resp. rate 18, height 5' 4"  (1.626 m), weight 87.5 kg (193 lb), SpO2 97 %. CONSTITUTIONAL: No acute distress. EYES: Pupils are equal, round, and reactive to light, Sclera are non-icteric. EARS, NOSE, MOUTH AND THROAT: The oropharynx is clear. The oral mucosa is pink and moist. Hearing is intact to voice. LYMPH NODES:  Lymph nodes in the neck are normal. RESPIRATORY:  Lungs are clear. There is normal respiratory effort, with equal breath sounds bilaterally, and without pathologic use of accessory muscles. CARDIOVASCULAR: Heart is regular without murmurs, gallops, or rubs. GI: The abdomen is soft, tender to deep palpation in the right lower quadrant at McBurney's point, and nondistended. There are no palpable masses. There is no hepatosplenomegaly. There are normal bowel sounds in all  quadrants. GU: Rectal deferred.   MUSCULOSKELETAL: Normal muscle strength and tone. No cyanosis or edema.   SKIN: Turgor is good and there are no pathologic skin lesions or ulcers. NEUROLOGIC: Motor and sensation is grossly normal. Cranial nerves are grossly intact. PSYCH:  Oriented to person, place and time. Affect is normal.  Data Reviewed Images and labs reviewed.  Labs are significant for leukocytosis of 16.7, hyponatremia of 127, hypokalemia 3.4, hypochloremia of 90, elevated alkaline phosphatase of 137, elevated bilirubin of 1.3.  CT scan of the abdomen shows a dilated, thickened appendix with a central appendicolith in the right lower quadrant consistent with acute appendicitis. I have personally reviewed the patient's imaging, laboratory findings and medical records.    Assessment    Acute appendicitis    Plan    67 year old female with acute appendicitis.  Discussed the diagnosis in detail with the patient and her husband.  Described the treatment options of a laparoscopic appendectomy versus medical treatment with IV antibiotics.  Described the risk, benefits, alternatives of each.  After this discussion they stated that they would like to proceed with a laparoscopic appendectomy.  The procedure was described in detail again to include the risk, benefits, alternatives and he voiced understanding and desire to proceed.  We will proceed to the operating room as soon as the OR is available.  Should anticipated wait be greater than 2 hours she will be admitted to the hospital first and then go to the OR.  Should the his body weight be less than 2 hours we will wait in the emergency department and go straight to the OR prior to being admitted for observation.  They voiced understanding of this plan and agreed to proceed.     Time spent with the patient was 50 minutes, with more than 50% of the time spent in face-to-face education, counseling and care coordination.     Clayburn Pert, MD  FACS General Surgeon 06/14/2017, 12:55 PM

## 2017-06-14 NOTE — Op Note (Signed)
laparascopic appendectomy   Sumayya Muha Date of operation:  06/14/2017  Indications: The patient presented with a history of  abdominal pain. Workup has revealed findings consistent with acute appendicitis.  Pre-operative Diagnosis: Acute appendicitis without mention of peritonitis  Post-operative Diagnosis: Same  Surgeon: Juanda Crumble T. Adonis Huguenin, MD, FACS  Anesthesia: General with endotracheal tube  Procedure Details  The patient was seen again in the preop area. The options of surgery versus observation were reviewed with the patient and/or family. The risks of bleeding, infection, recurrence of symptoms, negative laparoscopy, potential for an open procedure, bowel injury, abscess or infection, were all reviewed as well. The patient was taken to Operating Room, identified as Kaniesha Barile and the procedure verified as laparoscopic appendectomy. A Time Out was held and the above information confirmed.  The patient was placed in the supine position and general anesthesia was induced.  Antibiotic prophylaxis was administered and VT E prophylaxis was in place. A Foley catheter was placed by the nursing staff.   The abdomen was prepped and draped in a sterile fashion. An infraumbilical incision was made. A Veress needle was placed and pneumoperitoneum was obtained. A 5 mm trocar port was placed without difficulty and the abdominal cavity was explored.  Under direct vision a 5 mm suprapubic port was placed and a 12 mm left lateral port was placed all under direct vision.  Inflammatory fluid was immediately identified within the pelvis.  The appendix was identified and found to be acutely inflamed with areas of apparent necrosis but without perforation.  The omentum and mesentery of the small bowel were adherent to the appendix but easily dissected free.  The appendix was carefully dissected. The base of the appendix was dissected out and divided with a standard load Endo GIA. The mesoappendix was  divided with the use of a harmonic scalpel.  The appendix was passed out through the left lateral port site with the aid of an Endo Catch bag. The right lower quadrant and pelvis was then irrigated with copious amounts of normal saline which was aspirated. Inspection  failed to identify any additional bleeding and there were no signs of bowel injury.    Again the right lower quadrant was inspected there was no sign of bleeding or bowel injury therefore pneumoperitoneum was released, all ports were removed and the right lower quadrant fascia was reapproximated with a figure-of-eight 0 Vicryl and the skin incisions were approximated with subcuticular 4-0 Monocryl. Steri-Strips and Mastisol and sterile dressings were placed.  The patient tolerated the procedure well, there were no complications. The sponge lap and needle count were correct at the end of the procedure.  The patient was taken to the recovery room in stable condition to be admitted for continued care.  Findings: Acute appendicitis  Estimated Blood Loss: 5 mL                  Specimens: appendix         Complications: None                  Clayburn Pert MD, FACS

## 2017-06-14 NOTE — Anesthesia Procedure Notes (Signed)
Procedure Name: Intubation Date/Time: 06/14/2017 4:28 PM Performed by: Silvana Newness, CRNA Pre-anesthesia Checklist: Patient identified, Emergency Drugs available, Suction available, Patient being monitored and Timeout performed Patient Re-evaluated:Patient Re-evaluated prior to induction Oxygen Delivery Method: Circle system utilized Preoxygenation: Pre-oxygenation with 100% oxygen Induction Type: IV induction Ventilation: Mask ventilation without difficulty Laryngoscope Size: Mac and 3 Grade View: Grade I Tube type: Oral Tube size: 7.0 mm Number of attempts: 1 Airway Equipment and Method: Stylet Placement Confirmation: ETT inserted through vocal cords under direct vision,  positive ETCO2 and breath sounds checked- equal and bilateral Secured at: 21 cm Tube secured with: Tape Dental Injury: Teeth and Oropharynx as per pre-operative assessment

## 2017-06-14 NOTE — Transfer of Care (Signed)
Immediate Anesthesia Transfer of Care Note  Patient: Christy Freeman  Procedure(s) Performed: APPENDECTOMY LAPAROSCOPIC (N/A )  Patient Location: PACU  Anesthesia Type:General  Level of Consciousness: awake, alert  and oriented  Airway & Oxygen Therapy: Patient Spontanous Breathing and Patient connected to face mask oxygen  Post-op Assessment: Report given to RN and Post -op Vital signs reviewed and stable  Post vital signs: Reviewed and stable  Last Vitals:  Vitals:   06/14/17 1306 06/14/17 1443  BP: (!) 159/78 (!) 176/68  Pulse: 87 81  Resp: 16 20  Temp:  (!) 38.3 C  SpO2: 98% 96%    Last Pain:  Vitals:   06/14/17 1443  TempSrc: Oral  PainSc:          Complications: No apparent anesthesia complications

## 2017-06-14 NOTE — ED Triage Notes (Signed)
Pt reports RLQ abdominal pain, fever and diarrhea that began yesterday. Pt brought over from [facility] for evaluation.

## 2017-06-14 NOTE — Anesthesia Preprocedure Evaluation (Signed)
Anesthesia Evaluation  Patient identified by MRN, date of birth, ID band Patient awake    Reviewed: Allergy & Precautions, H&P , NPO status , Patient's Chart, lab work & pertinent test results  History of Anesthesia Complications Negative for: history of anesthetic complications  Airway Mallampati: III  TM Distance: <3 FB Neck ROM: limited    Dental  (+) Chipped   Pulmonary neg pulmonary ROS, neg shortness of breath,           Cardiovascular Exercise Tolerance: Good hypertension, (-) angina+ Peripheral Vascular Disease  (-) Past MI and (-) DOE      Neuro/Psych PSYCHIATRIC DISORDERS Depression negative neurological ROS     GI/Hepatic GERD  Medicated and Controlled,(+) Hepatitis -  Endo/Other  negative endocrine ROS  Renal/GU      Musculoskeletal   Abdominal   Peds  Hematology negative hematology ROS (+)   Anesthesia Other Findings Past Medical History: No date: Alcohol abuse No date: Blood transfusion without reported diagnosis No date: Cancer ([place])     Comment:  basil cell on face No date: Colon polyps No date: GERD (gastroesophageal reflux disease) No date: H/O: depression No date: Hypercholesterolemia     Comment:  diet controlled No date: Hypertension No date: Lung nodule     Comment:  left lung  No date: Thyroid disease  Past Surgical History: No date: COLONOSCOPY No date: CYST REMOVAL HAND; Left No date: DILATION AND CURETTAGE OF UTERUS  BMI    Body Mass Index:  31.36 kg/m      Reproductive/Obstetrics negative OB ROS                             Anesthesia Physical Anesthesia Plan  ASA: III  Anesthesia Plan: General ETT   Post-op Pain Management:    Induction: Intravenous  PONV Risk Score and Plan: 4 or greater and Ondansetron, Dexamethasone and Midazolam  Airway Management Planned: Oral ETT  Additional Equipment:   Intra-op Plan:   Post-operative  Plan: Extubation in OR  Informed Consent: I have reviewed the patients History and Physical, chart, labs and discussed the procedure including the risks, benefits and alternatives for the proposed anesthesia with the patient or authorized representative who has indicated his/her understanding and acceptance.   Dental Advisory Given  Plan Discussed with: Anesthesiologist, CRNA and Surgeon  Anesthesia Plan Comments: (Patient consented for risks of anesthesia including but not limited to:  - adverse reactions to medications - damage to teeth, lips or other oral mucosa - sore throat or hoarseness - Damage to heart, brain, lungs or loss of life  Patient voiced understanding.)        Anesthesia Quick Evaluation

## 2017-06-14 NOTE — ED Notes (Signed)
Pt verbalized she does not want medication at this time. PT reports "I think I'll just wait for the surgery"

## 2017-06-15 ENCOUNTER — Encounter: Payer: Self-pay | Admitting: General Surgery

## 2017-06-15 MED ORDER — HYDROCODONE-ACETAMINOPHEN 5-325 MG PO TABS: 1.0000 | ORAL_TABLET | Freq: Four times a day (QID) | ORAL | 0 refills | Status: DC | PRN

## 2017-06-15 NOTE — Progress Notes (Signed)
Patient discharge teaching given, including activity, diet, follow-up appoints, and medications. Patient verbalized understanding of all discharge instructions. IV access was d/c'd. Vitals are stable. Skin is intact except as charted in most recent assessments. Pt to be escorted out by volunteer, to be driven home by family.  Ellington [place] [employer]

## 2017-06-15 NOTE — Discharge Summary (Signed)
Patient ID: Christy Freeman MRN: [id number] DOB/AGE: Oct 12, 1950 67 y.o.  Admit date: 06/14/2017 Discharge date: 06/15/2017  Discharge Diagnoses:  appendicitis  Procedures Performed: Laparoscopic appendectomy  Discharged Condition: good  Hospital Course: Patient placed in observation with appendicitis. Underwent and uneventful laparoscopic appendectomy. Able to be discharged home the first post operative day.  Discharge Orders: Discharge Instructions    Call MD for:  difficulty breathing, headache or visual disturbances   Complete by:  As directed    Call MD for:  persistant nausea and vomiting   Complete by:  As directed    Call MD for:  redness, tenderness, or signs of infection (pain, swelling, redness, odor or green/yellow discharge around incision site)   Complete by:  As directed    Call MD for:  severe uncontrolled pain   Complete by:  As directed    Call MD for:  temperature >100.4   Complete by:  As directed    Diet - low sodium heart healthy   Complete by:  As directed    Increase activity slowly   Complete by:  As directed       Disposition: 01-Home or Self Care  Discharge Medications: Allergies as of 06/15/2017   No Known Allergies     Medication List    TAKE these medications   amLODipine 10 MG tablet Commonly known as:  NORVASC Take 1 tablet (10 mg total) daily by mouth.   aspirin EC 81 MG tablet Take 81 mg by mouth 2 (two) times daily.   ezetimibe 10 MG tablet Commonly known as:  ZETIA Take 1 tablet (10 mg total) by mouth daily.   HYDROcodone-acetaminophen 5-325 MG tablet Commonly known as:  NORCO Take 1 tablet by mouth every 6 (six) hours as needed for moderate pain.   lisinopril 40 MG tablet Commonly known as:  PRINIVIL,ZESTRIL take 1 tablet by mouth once daily   metoprolol succinate 100 MG 24 hr tablet Commonly known as:  TOPROL-XL take 1 tablet by mouth once daily   omeprazole 10 MG capsule Commonly known as:  PRILOSEC Take 1 capsule  (10 mg total) by mouth daily.   PROBIOTIC DAILY PO Take 1 capsule by mouth as needed.   rosuvastatin 5 MG tablet Commonly known as:  CRESTOR take 1 tablet by mouth at bedtime   venlafaxine XR 75 MG 24 hr capsule Commonly known as:  EFFEXOR XR Take 1 capsule (75 mg total) by mouth daily with breakfast.        Follwup: Follow-up Information    Clayburn Pert, MD. Schedule an appointment as soon as possible for a visit in 1 week(s).   Specialty:  General Surgery Why:  postop Contact information: [address] [place] [address] [phone]           Signed: Clayburn Pert 06/15/2017, 7:33 AM

## 2017-06-15 NOTE — Care Management Obs Status (Signed)
[facility] NOTIFICATION   Patient Details  Name: Christy Freeman MRN: [id number] Date of Birth: 01-07-1951   Medicare Observation Status Notification Given:  No(admitted obs less than 24 hours)    Beverly Sessions, RN 06/15/2017, 10:16 AM

## 2017-06-15 NOTE — Discharge Instructions (Signed)
Laparoscopic Appendectomy, Adult, Care After These instructions give you information about caring for yourself after your procedure. Your doctor may also give you more specific instructions. Call your doctor if you have any problems or questions after your procedure. Follow these instructions at home: Medicines  Take over-the-counter and prescription medicines only as told by your doctor.  Do not drive for 24 hours if you received a sedative.  Do not drive or use heavy machinery while taking prescription pain medicine.  If you were prescribed an antibiotic medicine, take it as told by your doctor. Do not stop taking it even if you start to feel better. Activity  Do not lift anything that is heavier than 10 pounds (4.5 kg) for 3 weeks or as told by your doctor.  Do not play contact sports for 3 weeks or as told by your doctor.  Slowly return to your normal activities. Bathing  Keep your cuts from surgery (incisions) clean and dry. ? Gently wash the cuts with soap and water. ? Rinse the cuts with water until the soap is gone. ? Pat the cuts dry with a clean towel. Do not rub the cuts.  You may take showers after 24 hours.  Do not take baths, swim, or use a hot tub for 2 weeks or as told by your doctor. Cut Care  Follow instructions from your doctor about how to take care of your cuts. Make sure you: ? Wash your hands with soap and water before you change your bandage (dressing). If you do not have soap and water, use hand sanitizer. ? Change your bandage daily until the bandage is clean when it is removed. ? Leave stitches (sutures), skin glue, or skin tape (adhesive) strips in place. They may need to stay in place for 2 weeks or longer. If tape strips get loose and curl up, you may trim the loose edges. Do not remove tape strips completely unless your doctor says it is okay.  Check your cuts every day for signs of infection. Check for: ? More redness, swelling, or pain. ? More  fluid or blood. ? Warmth. ? Pus or a bad smell. Other Instructions  If you were sent home with a drain, follow instructions from your doctor about how to use it and care for it.  Take deep breaths. This helps to keep your lungs from getting swollen (inflamed).  To help with constipation: ? Drink plenty of fluids. ? Eat plenty of fruits and vegetables.  Keep all follow-up visits as told by your doctor. This is important. Contact a doctor if:  You have more redness, swelling, or pain around a cut from surgery.  You have more fluid or blood coming from a cut.  Your cut feels warm to the touch.  You have pus or a bad smell coming from a cut or a bandage.  The edges of a cut break open after the stitches have been taken out.  You have pain in your shoulders that gets worse.  You feel dizzy or you pass out (faint).  You have shortness of breath.  You keep feeling sick to your stomach (nauseous).  You keep throwing up (vomiting).  You get diarrhea or you cannot control your poop.  You lose your appetite.  You have swelling or pain in your legs. Get help right away if:  You have a fever.  You get a rash.  You have trouble breathing.  You have sharp pains in your chest. This information is not  intended to replace advice given to you by your health care provider. Make sure you discuss any questions you have with your health care provider. Document Released: 03/18/2009 Document Revised: 10/28/2015 Document Reviewed: 11/09/2014 Elsevier Interactive Patient Education  2018 [employer].

## 2017-06-18 ENCOUNTER — Ambulatory Visit (INDEPENDENT_AMBULATORY_CARE_PROVIDER_SITE_OTHER): Payer: PPO | Admitting: Surgery

## 2017-06-18 ENCOUNTER — Telehealth: Payer: Self-pay | Admitting: General Surgery

## 2017-06-18 ENCOUNTER — Encounter: Payer: Self-pay | Admitting: Surgery

## 2017-06-18 VITALS — BP 165/77 | HR 86 | Temp 98.5°F | Ht 64.0 in | Wt 183.0 lb

## 2017-06-18 DIAGNOSIS — Z09 Encounter for follow-up examination after completed treatment for conditions other than malignant neoplasm: Secondary | ICD-10-CM

## 2017-06-18 LAB — SURGICAL PATHOLOGY

## 2017-06-18 NOTE — Patient Instructions (Addendum)
We will see you back as listed below:  Try to keep your arm elevated and use warm compresses multiple time daily. If your arm is not better by Wednesday morning please give our office a call.  Try some of these methods to help with the hoariness:  Rest your voice.  Gargle warm salt water. ... Add moisture with a humidifier. ... Suck on lozenges. ... Apple cider vinegar. ... Tea with honey. ... Slippery elm tea with lemon. ... Ginger root.

## 2017-06-18 NOTE — Telephone Encounter (Signed)
Patient called back again stating that she had surgery (Laparoscopic Appendectomy) done on 06/14/2017 by Dr. Adonis Huguenin at [facility]. Patient is calling because ever since she left the hospital on 06/15/2017, her arm where the IV was located, she has it red and with a knot. She had applied warm compresses and it looks like it is getting worse. She had also had a cough, sore throat and hoarseness since 06/15/2017. She stated that she had a fever on the first night that she came back but not any longer. Patient denied nausea, vomiting, constipation, diarrhea, fever in the past 2 days, or any problems with her incisions.  I asked patient to come in so we could take a look at her. Patient agreed and we will see her at 2:45 PM today by Dr. Dahlia Byes.

## 2017-06-18 NOTE — Telephone Encounter (Signed)
Patients calling, said she is having some soreness in her throat, also said she has something on her arm where the iv was, said it was hard, pinkish, pain, pain level being a four. Said her incision looked fine. Please call patient and advise. Patient has an upcoming appointment with Dr. Adonis Huguenin on 06/22/17.

## 2017-06-19 ENCOUNTER — Encounter: Payer: Self-pay | Admitting: Surgery

## 2017-06-19 NOTE — Progress Notes (Signed)
S/p lap appy 4 days ago by Dr. Adonis Huguenin. Main complaint today is superficial thrombophlebitis of her left arm. No fevers or chills. Path pending. Taking PO. Pain ok She has some hoarseness  PE NAD Abd: incisions c/d/i, no infection, some ecchymosis from local anesthetic. No peritonitis Ext: left Sup thrombophlebitis, mild edema and erythema,. No evidence of abscess or necrotizing infection  A/P professional thrombophlebitis of the left arm. Recommend arm elevation and warm compresses. No need for drainage, debridement or antibiotics therapy this time. Explained to her that her hoarseness should subside but we will follow her up by the end of the week. The hoarseness is not any better she may be seen by anesthesiologist or ENT. No surgical complications.

## 2017-06-22 ENCOUNTER — Ambulatory Visit (INDEPENDENT_AMBULATORY_CARE_PROVIDER_SITE_OTHER): Payer: PPO | Admitting: General Surgery

## 2017-06-22 ENCOUNTER — Encounter: Payer: Self-pay | Admitting: General Surgery

## 2017-06-22 VITALS — BP 166/83 | HR 88 | Temp 98.7°F | Ht 64.0 in | Wt 181.6 lb

## 2017-06-22 DIAGNOSIS — Z4889 Encounter for other specified surgical aftercare: Secondary | ICD-10-CM

## 2017-06-22 NOTE — Patient Instructions (Signed)

## 2017-06-22 NOTE — Progress Notes (Signed)
Outpatient Surgical Follow Up  06/22/2017  Christy Freeman is an 67 y.o. female.   Chief Complaint  Patient presents with  . Routine Post Op    Post Op: Laparocscopic Appendectomy 06/15/17 Dr.Rynell Ciotti    HPI: 67 year old female returns to clinic now 1 week status post laparoscopic appendectomy.  Patient reports doing very well.  She denies any pain.  She is eating well and having normal bowel function.  She denies any fevers, chills, nausea, vomiting, chest pain, shortness of breath.  She is been very happy with her surgical experience.  Past Medical History:  Diagnosis Date  . Alcohol abuse   . Blood transfusion without reported diagnosis   . Cancer ([facility])    basil cell on face  . Colon polyps   . GERD (gastroesophageal reflux disease)   . H/O: depression   . Hypercholesterolemia    diet controlled  . Hypertension   . Lung nodule    left lung   . Thyroid disease     Past Surgical History:  Procedure Laterality Date  . COLONOSCOPY    . CYST REMOVAL HAND Left   . DILATION AND CURETTAGE OF UTERUS    . LAPAROSCOPIC APPENDECTOMY N/A 06/14/2017   Procedure: APPENDECTOMY LAPAROSCOPIC;  Surgeon: Clayburn Pert, MD;  Location: [facility] ORS;  Service: General;  Laterality: N/A;    Family History  Problem Relation Age of Onset  . Diabetes Mother   . Heart failure Mother   . Hypertension Mother   . Stroke Father   . Hypertension Father   . Heart disease Brother   . Stroke Maternal Grandmother   . Colon cancer Neg Hx     Social History:  reports that  has never smoked. she has never used smokeless tobacco. She reports that she drinks about 8.4 oz of alcohol per week. She reports that she does not use drugs.  Allergies: No Known Allergies  Medications reviewed.    ROS  A multipoint review of systems was completed, all pertinent positives and negatives are documented within the HPI and the remainder are negative  BP (!) 166/83   Pulse 88   Temp 98.7 F (37.1 C) (Oral)    Ht 5' 4"  (1.626 m)   Wt 82.4 kg (181 lb 9.6 oz)   BMI 31.17 kg/m   Physical Exam  General: No acute distress Chest: Clear to auscultation Heart: Regular rate and rhythm Abdomen: Soft, nontender.  Well approximated laparoscopic incision sites without evidence of erythema or drainage.  There is resolving ecchymosis to the infraumbilical and left lower quadrant sites.   No results found for this or any previous visit (from the past 48 hour(s)). No results found.  Assessment/Plan:  1. Aftercare following surgery 67 year old female status post laparoscopic appendectomy.  Pathology reviewed with the patient.  Discussed standard postoperative precautions and returning to normal activities.  She will follow-up in clinic on an as-needed basis.     Clayburn Pert, MD FACS General Surgeon  06/22/2017,10:33 AM

## 2017-06-26 ENCOUNTER — Encounter: Payer: Self-pay | Admitting: Family Medicine

## 2017-06-26 ENCOUNTER — Ambulatory Visit (INDEPENDENT_AMBULATORY_CARE_PROVIDER_SITE_OTHER): Payer: PPO | Admitting: Family Medicine

## 2017-06-26 VITALS — BP 126/80 | HR 85 | Temp 98.8°F | Ht 64.0 in | Wt 183.2 lb

## 2017-06-26 DIAGNOSIS — R070 Pain in throat: Secondary | ICD-10-CM | POA: Diagnosis not present

## 2017-06-26 DIAGNOSIS — R05 Cough: Secondary | ICD-10-CM | POA: Diagnosis not present

## 2017-06-26 DIAGNOSIS — H109 Unspecified conjunctivitis: Secondary | ICD-10-CM | POA: Insufficient documentation

## 2017-06-26 DIAGNOSIS — H1031 Unspecified acute conjunctivitis, right eye: Secondary | ICD-10-CM

## 2017-06-26 DIAGNOSIS — R059 Cough, unspecified: Secondary | ICD-10-CM

## 2017-06-26 LAB — POCT RAPID STREP A (OFFICE): RAPID STREP A SCREEN: NEGATIVE

## 2017-06-26 MED ORDER — POLYMYXIN B-TRIMETHOPRIM 10000-0.1 UNIT/ML-% OP SOLN: 1.0000 [drp] | OPHTHALMIC | 0 refills | Status: DC

## 2017-06-26 MED ORDER — DOXYCYCLINE HYCLATE 100 MG PO TABS: 100.0000 mg | ORAL_TABLET | Freq: Two times a day (BID) | ORAL | 0 refills | Status: DC

## 2017-06-26 NOTE — Patient Instructions (Signed)
Great to see you.  Take doxycyline as directed- 1 tablet twice daily for 7 days.  Polytrim eye drops as well as directed.

## 2017-06-26 NOTE — Progress Notes (Signed)
Subjective:   Patient ID: Christy Freeman, female    DOB: Feb 13, 1951, 67 y.o.   MRN: [id number]  Devota Viruet is a pleasant 67 y.o. year old female who presents to clinic today with Cough (Patient is here today C/O a cough that started on 1.17.19.  Sunday the cough worsened and she became febrile.  She had an emergency appendectomy on 1.10.19.  Cough is productive with clear sputum.  She woke this am with OD matted shut.  Spouse Dx with conjunctivitis just following her surgery.  Throat pain has lessened but she noticed a white area on left tonsil.)  on 06/26/2017  HPI:  Cough- started 10 days ago. Cough worsened 3 days ago- became febrile.  S/p emergency appendectomy on 06/14/17.  Woke up with morning with her eye mattered shut.  Husband tx for conjunctivitis after her surgery.  Throat pain is better today, no fever today.  Current Outpatient Medications on File Prior to Visit  Medication Sig Dispense Refill  . amLODipine (NORVASC) 10 MG tablet Take 1 tablet (10 mg total) daily by mouth. 30 tablet 3  . aspirin EC 81 MG tablet Take 81 mg by mouth 2 (two) times daily.    Marland Kitchen lisinopril (PRINIVIL,ZESTRIL) 40 MG tablet take 1 tablet by mouth once daily 90 tablet 3  . metoprolol succinate (TOPROL-XL) 100 MG 24 hr tablet take 1 tablet by mouth once daily 30 tablet 11  . omeprazole (PRILOSEC) 10 MG capsule Take 1 capsule (10 mg total) by mouth daily. 90 capsule 3  . Probiotic Product (PROBIOTIC DAILY PO) Take 1 capsule by mouth as needed.     . rosuvastatin (CRESTOR) 5 MG tablet take 1 tablet by mouth at bedtime 30 tablet 3  . venlafaxine XR (EFFEXOR XR) 75 MG 24 hr capsule Take 1 capsule (75 mg total) by mouth daily with breakfast. 30 capsule 3   No current facility-administered medications on file prior to visit.     No Known Allergies  Past Medical History:  Diagnosis Date  . Alcohol abuse   . Blood transfusion without reported diagnosis   . Cancer (HCC)    basil cell on face  . Colon  polyps   . GERD (gastroesophageal reflux disease)   . H/O: depression   . Hypercholesterolemia    diet controlled  . Hypertension   . Lung nodule    left lung   . Thyroid disease     Past Surgical History:  Procedure Laterality Date  . APPENDECTOMY  06/14/2017  . COLONOSCOPY    . CYST REMOVAL HAND Left   . DILATION AND CURETTAGE OF UTERUS    . LAPAROSCOPIC APPENDECTOMY N/A 06/14/2017   Procedure: APPENDECTOMY LAPAROSCOPIC;  Surgeon: Clayburn Pert, MD;  Location: [facility] ORS;  Service: General;  Laterality: N/A;    Family History  Problem Relation Age of Onset  . Diabetes Mother   . Heart failure Mother   . Hypertension Mother   . Stroke Father   . Hypertension Father   . Heart disease Brother   . Stroke Maternal Grandmother   . Colon cancer Neg Hx     Social History   Socioeconomic History  . Marital status: Married    Spouse name: Not on file  . Number of children: Not on file  . Years of education: Not on file  . Highest education level: Not on file  Social Needs  . Financial resource strain: Not on file  . Food insecurity - worry: Not on file  .  Food insecurity - inability: Not on file  . Transportation needs - medical: Not on file  . Transportation needs - non-medical: Not on file  Occupational History  . Not on file  Tobacco Use  . Smoking status: Never Smoker  . Smokeless tobacco: Never Used  Substance and Sexual Activity  . Alcohol use: Yes    Alcohol/week: 8.4 oz    Types: 8 Standard drinks or equivalent, 6 Glasses of wine per week  . Drug use: No  . Sexual activity: Yes  Other Topics Concern  . Not on file  Social History Narrative   She would desire CPR.   Does have a living will.   The PMH, PSH, Social History, Family History, Medications, and allergies have been reviewed in [facility], and have been updated if relevant.  Review of Systems  Constitutional: Positive for fatigue and fever.  HENT: Positive for congestion.   Eyes: Positive for  discharge.  Respiratory: Positive for cough. Negative for shortness of breath, wheezing and stridor.   Cardiovascular: Negative.   Gastrointestinal: Negative.   Endocrine: Negative.   Genitourinary: Negative.   Musculoskeletal: Negative.   Allergic/Immunologic: Negative.   Neurological: Negative.   Hematological: Negative.   Psychiatric/Behavioral: Negative.   All other systems reviewed and are negative.      Objective:    BP 126/80 (BP Location: Left Arm, Patient Position: Sitting, Cuff Size: Normal)   Pulse 85   Temp 98.8 F (37.1 C) (Oral)   Ht 5' 4"  (1.626 m)   Wt 183 lb 3.2 oz (83.1 kg)   SpO2 99%   BMI 31.45 kg/m    Physical Exam  Constitutional: She is oriented to person, place, and time. She appears well-developed and well-nourished. No distress.  HENT:  Head: Normocephalic and atraumatic.  Eyes: EOM are normal. Pupils are equal, round, and reactive to light. Right eye exhibits discharge. Right conjunctiva is injected. Left conjunctiva is not injected. No scleral icterus.  Cardiovascular: Normal rate and regular rhythm.  Pulmonary/Chest: Effort normal and breath sounds normal. No respiratory distress. She has no wheezes. She has no rales.  Abdominal:    Musculoskeletal: Normal range of motion. She exhibits no edema.  Neurological: She is alert and oriented to person, place, and time. No cranial nerve deficit.  Skin: Skin is warm and dry. She is not diaphoretic.  Psychiatric: She has a normal mood and affect. Her behavior is normal. Judgment and thought content normal.  Nursing note and vitals reviewed.         Assessment & Plan:   Throat pain in adult - Plan: POCT rapid strep A  Cough  Acute conjunctivitis of right eye, unspecified acute conjunctivitis type No Follow-up on file.

## 2017-06-26 NOTE — Assessment & Plan Note (Signed)
Exam reassuring- but given recent change in sputum with fever and recent hospitalization, will cover with broad spectrum abx- doxycycline 100 mg twice daily x 7 days. Call or return to clinic prn if these symptoms worsen or fail to improve as anticipated. The patient indicates understanding of these issues and agrees with the plan.

## 2017-06-26 NOTE — Assessment & Plan Note (Signed)
New- eRx sent for polytrim drops, advised warm compresses. Call or return to clinic prn if these symptoms worsen or fail to improve as anticipated. The patient indicates understanding of these issues and agrees with the plan.

## 2017-07-01 ENCOUNTER — Encounter: Payer: Self-pay | Admitting: General Surgery

## 2017-07-02 ENCOUNTER — Encounter: Payer: Self-pay | Admitting: General Surgery

## 2017-07-02 ENCOUNTER — Telehealth: Payer: Self-pay

## 2017-07-02 ENCOUNTER — Ambulatory Visit (INDEPENDENT_AMBULATORY_CARE_PROVIDER_SITE_OTHER): Payer: PPO | Admitting: General Surgery

## 2017-07-02 VITALS — BP 140/84 | HR 90 | Temp 98.2°F | Ht 64.0 in | Wt 182.8 lb

## 2017-07-02 DIAGNOSIS — Z4889 Encounter for other specified surgical aftercare: Secondary | ICD-10-CM

## 2017-07-02 NOTE — Patient Instructions (Signed)

## 2017-07-02 NOTE — Telephone Encounter (Signed)
Patient sent message through my chart over the weekend. Patient stated she was having discomfort midline to the waist.  Denies fever, chills, nausea or vomiting. She did have some diarrhea. She took her last antibiotic this morning.  She has a good appetite.  Patient said her pain is much better today but would like to be checked out.   I will speak with Dr.Woodham and call patient back.

## 2017-07-02 NOTE — Progress Notes (Signed)
Outpatient Surgical Follow Up  07/02/2017  Christy Freeman is an 67 y.o. female.   Chief Complaint  Patient presents with  . Routine Post Op    Laparoscopic Appendectomy 06/14/17 Dr.Avis Tirone    HPI: 67 year old female who is now 18 days status post laparoscopic appendectomy returns to clinic for evaluation due to new onset of abdominal pain over the weekend.  Patient reports that she developed an upper respiratory infection last week and had violent coughing and retching.  In the middle of that attack she had new onset of right lower quadrant abdominal pain just to the right of her lower midline incisions.  She states that the upper respiratory infection appears to be resolving and her abdominal discomfort has improved since Saturday.  She denies any fevers, chills, nausea, vomiting, diarrhea, constipation.  Past Medical History:  Diagnosis Date  . Alcohol abuse   . Blood transfusion without reported diagnosis   . Cancer (HCC)    basil cell on face  . Colon polyps   . GERD (gastroesophageal reflux disease)   . H/O: depression   . Hypercholesterolemia    diet controlled  . Hypertension   . Lung nodule    left lung   . Thyroid disease     Past Surgical History:  Procedure Laterality Date  . APPENDECTOMY  06/14/2017  . COLONOSCOPY    . CYST REMOVAL HAND Left   . DILATION AND CURETTAGE OF UTERUS    . LAPAROSCOPIC APPENDECTOMY N/A 06/14/2017   Procedure: APPENDECTOMY LAPAROSCOPIC;  Surgeon: Clayburn Pert, MD;  Location: [facility] ORS;  Service: General;  Laterality: N/A;    Family History  Problem Relation Age of Onset  . Diabetes Mother   . Heart failure Mother   . Hypertension Mother   . Stroke Father   . Hypertension Father   . Heart disease Brother   . Stroke Maternal Grandmother   . Colon cancer Neg Hx     Social History:  reports that  has never smoked. she has never used smokeless tobacco. She reports that she drinks about 8.4 oz of alcohol per week. She reports that  she does not use drugs.  Allergies: No Known Allergies  Medications reviewed.    ROS A multipoint review of systems was completed, all pertinent positives and negatives are documented within the HPI and the remainder are negative   BP 140/84   Pulse 90   Temp 98.2 F (36.8 C) (Oral)   Ht 5' 4"  (1.626 m)   Wt 82.9 kg (182 lb 12.8 oz)   BMI 31.38 kg/m   Physical Exam General: No acute distress Chest: Clear to all station Heart: Regular rate and rhythm Abdomen: Soft, nontender, nondistended.  Well approximated laparoscopic incision sites on the evidence of erythema or drainage.  Area of tenderness is on the lateral border of the rectus muscle on the right.  No rebound or guarding.    No results found for this or any previous visit (from the past 48 hour(s)). No results found.  Assessment/Plan:  1. Aftercare following surgery 67 year old female 18 days status post laparoscopic appendectomy.  Her area of tenderness correlates with the rectus muscle.  Discussed this is likely related to her coughing and retching during her upper respiratory infection associated with the trocar sites that went through the rectus muscle on the right.  The patient voiced understanding.  Counseled her to switch to the signs and symptoms of infection or recurrence and to return to clinic in [place] should they  occur.  Otherwise follow-up on an as-needed basis.     Clayburn Pert, MD FACS General Surgeon  07/02/2017,12:19 PM

## 2017-07-04 ENCOUNTER — Encounter: Payer: Self-pay | Admitting: Family Medicine

## 2017-07-04 NOTE — Telephone Encounter (Signed)
Pt called to add to her mychart message. She has finished the prescribed course of abx, but still has chest congestion, especially in the am.  She is wanting to know if she needs another abx.  Cb# [phone]

## 2017-07-12 ENCOUNTER — Ambulatory Visit: Payer: Self-pay | Admitting: Family Medicine

## 2017-07-13 ENCOUNTER — Ambulatory Visit (INDEPENDENT_AMBULATORY_CARE_PROVIDER_SITE_OTHER): Payer: PPO | Admitting: Family Medicine

## 2017-07-13 ENCOUNTER — Encounter: Payer: Self-pay | Admitting: Family Medicine

## 2017-07-13 ENCOUNTER — Ambulatory Visit (INDEPENDENT_AMBULATORY_CARE_PROVIDER_SITE_OTHER): Payer: PPO

## 2017-07-13 VITALS — BP 140/90 | HR 85 | Temp 98.9°F | Ht 64.0 in | Wt 186.0 lb

## 2017-07-13 DIAGNOSIS — R05 Cough: Secondary | ICD-10-CM

## 2017-07-13 DIAGNOSIS — R059 Cough, unspecified: Secondary | ICD-10-CM

## 2017-07-13 DIAGNOSIS — H6983 Other specified disorders of Eustachian tube, bilateral: Secondary | ICD-10-CM | POA: Diagnosis not present

## 2017-07-13 LAB — CBC
HCT: 39.5 % (ref 35.0–45.0)
Hemoglobin: 13.5 g/dL (ref 11.7–15.5)
MCH: 30.2 pg (ref 27.0–33.0)
MCHC: 34.2 g/dL (ref 32.0–36.0)
MCV: 88.4 fL (ref 80.0–100.0)
MPV: 10.9 fL (ref 7.5–12.5)
Platelets: 292 10*3/uL (ref 140–400)
RBC: 4.47 10*6/uL (ref 3.80–5.10)
RDW: 12 % (ref 11.0–15.0)
WBC: 12 10*3/uL — ABNORMAL HIGH (ref 3.8–10.8)

## 2017-07-13 MED ORDER — PREDNISONE 20 MG PO TABS: 20.0000 mg | ORAL_TABLET | Freq: Two times a day (BID) | ORAL | 0 refills | Status: AC

## 2017-07-13 MED ORDER — BENZONATATE 200 MG PO CAPS: 200.0000 mg | ORAL_CAPSULE | Freq: Two times a day (BID) | ORAL | 0 refills | Status: DC | PRN

## 2017-07-13 MED ORDER — AMOXICILLIN-POT CLAVULANATE 875-125 MG PO TABS: 1.0000 | ORAL_TABLET | Freq: Two times a day (BID) | ORAL | 0 refills | Status: DC

## 2017-07-13 NOTE — Progress Notes (Signed)
Subjective:  Patient ID: Christy Freeman, female    DOB: 05/05/1951  Age: 67 y.o. MRN: [id number]  CC: Cough and Sore Throat   HPI Christy Freeman presents for evaluation of a cough that is ongoing since her last visit.  She did take the doxycycline.  Cough persists and she feels some heaviness in her chest with it.  She has been afebrile and is coughing up clear phlegm.  She denies a history of asthma, or rad or recent wheezing.  He does not smoke and is not exposed to cigarette smoke.  Status post laparoscopic appendectomy last month.   Outpatient Medications Prior to Visit  Medication Sig Dispense Refill  . amLODipine (NORVASC) 10 MG tablet Take 1 tablet (10 mg total) daily by mouth. 30 tablet 3  . aspirin EC 81 MG tablet Take 81 mg by mouth 2 (two) times daily.    Marland Kitchen lisinopril (PRINIVIL,ZESTRIL) 40 MG tablet take 1 tablet by mouth once daily 90 tablet 3  . metoprolol succinate (TOPROL-XL) 100 MG 24 hr tablet take 1 tablet by mouth once daily 30 tablet 11  . omeprazole (PRILOSEC) 10 MG capsule Take 1 capsule (10 mg total) by mouth daily. 90 capsule 3  . Probiotic Product (PROBIOTIC DAILY PO) Take 1 capsule by mouth as needed.     . rosuvastatin (CRESTOR) 5 MG tablet take 1 tablet by mouth at bedtime 30 tablet 3  . trimethoprim-polymyxin b (POLYTRIM) ophthalmic solution Place 1 drop into the right eye every 4 (four) hours. 10 mL 0  . venlafaxine XR (EFFEXOR XR) 75 MG 24 hr capsule Take 1 capsule (75 mg total) by mouth daily with breakfast. 30 capsule 3  . doxycycline (VIBRA-TABS) 100 MG tablet Take 1 tablet (100 mg total) by mouth 2 (two) times daily. 14 tablet 0   No facility-administered medications prior to visit.     ROS Review of Systems  Constitutional: Negative for chills and fever.  HENT: Positive for ear pain and sore throat.   Eyes: Negative.   Respiratory: Positive for cough. Negative for chest tightness, shortness of breath and wheezing.   Cardiovascular: Negative.     Gastrointestinal: Negative.   Skin: Negative for rash.  Neurological: Negative.   Hematological: Does not bruise/bleed easily.  Psychiatric/Behavioral: Negative.     Objective:  BP 140/90 (BP Location: Left Arm, Patient Position: Sitting, Cuff Size: Normal)   Pulse 85   Temp 98.9 F (37.2 C) (Oral)   Ht 5' 4"  (1.626 m)   Wt 186 lb (84.4 kg)   SpO2 96%   BMI 31.93 kg/m   BP Readings from Last 3 Encounters:  07/13/17 140/90  07/02/17 140/84  06/26/17 126/80    Wt Readings from Last 3 Encounters:  07/13/17 186 lb (84.4 kg)  07/02/17 182 lb 12.8 oz (82.9 kg)  06/26/17 183 lb 3.2 oz (83.1 kg)    Physical Exam  Constitutional: She is oriented to person, place, and time. She appears well-developed and well-nourished. No distress.  HENT:  Head: Normocephalic and atraumatic.  Right Ear: External ear and ear canal normal. Tympanic membrane is scarred and retracted. Tympanic membrane is not injected and not erythematous.  Left Ear: External ear and ear canal normal. Tympanic membrane is scarred and retracted. Tympanic membrane is not injected and not erythematous.  Ears:  Mouth/Throat: Uvula is midline, oropharynx is clear and moist and mucous membranes are normal. No oropharyngeal exudate, posterior oropharyngeal edema, posterior oropharyngeal erythema or tonsillar abscesses.    Eyes:  Conjunctivae are normal. Pupils are equal, round, and reactive to light. Right eye exhibits no discharge. Left eye exhibits no discharge. No scleral icterus.  Neck: Neck supple. No JVD present. No tracheal deviation present. No thyromegaly present.  Cardiovascular: Normal rate, regular rhythm and normal heart sounds.  Pulmonary/Chest: Effort normal and breath sounds normal. No stridor. No respiratory distress. She has no wheezes. She has no rales.  Abdominal: Bowel sounds are normal.  Lymphadenopathy:    She has no cervical adenopathy.  Neurological: She is alert and oriented to person, place,  and time.  Skin: Skin is warm and dry. She is not diaphoretic.  Psychiatric: She has a normal mood and affect. Her behavior is normal.    Lab Results  Component Value Date   WBC 16.7 (H) 06/14/2017   HGB 15.2 06/14/2017   HCT 44.9 06/14/2017   PLT 312 06/14/2017   GLUCOSE 114 (H) 06/14/2017   CHOL 217 (H) 08/03/2016   TRIG 108.0 08/03/2016   HDL 69.80 08/03/2016   LDLDIRECT 191.0 07/30/2014   LDLCALC 126 (H) 08/03/2016   ALT 34 06/14/2017   AST 27 06/14/2017   NA 127 (L) 06/14/2017   K 3.4 (L) 06/14/2017   CL 90 (L) 06/14/2017   CREATININE 0.53 06/14/2017   BUN 8 06/14/2017   CO2 25 06/14/2017   TSH 1.82 08/03/2016    Ct Abdomen Pelvis W Contrast  Result Date: 06/14/2017 CLINICAL DATA:  Right lower quadrant pain for 2 days EXAM: CT ABDOMEN AND PELVIS WITH CONTRAST TECHNIQUE: Multidetector CT imaging of the abdomen and pelvis was performed using the standard protocol following bolus administration of intravenous contrast. CONTRAST:  171m ISOVUE-300 IOPAMIDOL (ISOVUE-300) INJECTION 61% COMPARISON:  None. FINDINGS: Lower chest: No acute abnormality. Hepatobiliary: No focal liver abnormality is seen. No gallstones, gallbladder wall thickening, or biliary dilatation. Pancreas: Unremarkable. No pancreatic ductal dilatation or surrounding inflammatory changes. Spleen: Normal in size without focal abnormality. Adrenals/Urinary Tract: Adrenal glands are unremarkable. Kidneys are normal, without renal calculi, focal lesion, or hydronephrosis. Bladder is unremarkable. Stomach/Bowel: The appendix is dilated to 11 mm with a central appendicolith. The appendicolith measures approximately 9 mm in greatest dimension. Mild periappendiceal inflammatory changes are noted consistent with acute appendicitis. Scattered diverticular changes noted without evidence of diverticulitis. Vascular/Lymphatic: Aortic atherosclerosis. No enlarged abdominal or pelvic lymph nodes. Reproductive: Uterus and bilateral  adnexa are unremarkable. Other: No abdominal wall hernia or abnormality. No abdominopelvic ascites. Musculoskeletal: Mild degenerative changes of the lumbar spine are noted. IMPRESSION: Changes consistent with acute appendicitis with central appendicolith and mild periappendiceal inflammatory change. No definitive abscess is noted. Electronically Signed   By: MInez CatalinaM.D.   On: 06/14/2017 11:46    Assessment & Plan:   KYarelwas seen today for cough and sore throat.  Diagnoses and all orders for this visit:  Cough -     CBC -     DG Chest 2 View; Future -     DG Chest 2 View -     amoxicillin-clavulanate (AUGMENTIN) 875-125 MG tablet; Take 1 tablet by mouth 2 (two) times daily. -     predniSONE (DELTASONE) 20 MG tablet; Take 1 tablet (20 mg total) by mouth 2 (two) times daily with a meal for 7 days. -     benzonatate (TESSALON) 200 MG capsule; Take 1 capsule (200 mg total) by mouth 2 (two) times daily as needed for cough.  Dysfunction of both eustachian tubes -     predniSONE (DELTASONE) 20  MG tablet; Take 1 tablet (20 mg total) by mouth 2 (two) times daily with a meal for 7 days.   I have discontinued Elda Dunkerson doxycycline. I am also having her start on amoxicillin-clavulanate, predniSONE, and benzonatate. Additionally, I am having her maintain her aspirin EC, Probiotic Product (PROBIOTIC DAILY PO), omeprazole, venlafaxine XR, metoprolol succinate, lisinopril, rosuvastatin, amLODipine, and trimethoprim-polymyxin b.  Meds ordered this encounter  Medications  . amoxicillin-clavulanate (AUGMENTIN) 875-125 MG tablet    Sig: Take 1 tablet by mouth 2 (two) times daily.    Dispense:  20 tablet    Refill:  0  . predniSONE (DELTASONE) 20 MG tablet    Sig: Take 1 tablet (20 mg total) by mouth 2 (two) times daily with a meal for 7 days.    Dispense:  14 tablet    Refill:  0  . benzonatate (TESSALON) 200 MG capsule    Sig: Take 1 capsule (200 mg total) by mouth 2 (two) times daily as  needed for cough.    Dispense:  20 capsule    Refill:  0   Chest x-ray with a question of the left lower lobe pneumonia.  She also has a past medical history of abnormal CT scans involving subpleural lesion on the left chest.  Follow-up: Return in about 1 week (around 07/20/2017), or if symptoms worsen or fail to improve.  Libby Maw, MD

## 2017-07-13 NOTE — Addendum Note (Signed)
Addended by: Lynnea Ferrier on: 07/13/2017 11:48 AM   Modules accepted: Orders

## 2017-07-16 ENCOUNTER — Telehealth: Payer: Self-pay | Admitting: Pulmonary Disease

## 2017-07-16 NOTE — Telephone Encounter (Signed)
Pt called stating she had looked on MyChart and saw where she was due to have a CT chest to follow up on the nodule.  Pt has not been seen at our office since 10/13/2013.  Pt had a CT chest 12/17/13 and then had a follow up CT chest 01/28/15 and with the results from that CT scan, it was stated pt needed to have one more followup CT chest in 6 months due to the nodule still not changing in size.  Pt stated to me she does not recall receiving that information about needing to have a CT done again 6 months from the last one.  Below are notes recorded from CT chest that was done 01/29/15: Notes Recorded by Levander Campion, LPN on 0/22/1798 at 1:02 PM Spoke with pt about results and rec of BQ. Pt voiced understanding. Nothing further is needed ------  Notes Recorded by Juanito Doom, MD on 01/29/2015 at 12:41 PM A, Please let her know that the nodule hasn't changed, this is good. She needs another CT in 6 months for one final evaluation of the nodule. Thanks B  Dr. Lake Bells, please advise what you want pt to do since she is stating she needs one more CT chest, but she has not been seen at our office since 10/13/13.  Thanks!

## 2017-07-17 ENCOUNTER — Encounter: Payer: Self-pay | Admitting: Family Medicine

## 2017-07-17 ENCOUNTER — Ambulatory Visit (INDEPENDENT_AMBULATORY_CARE_PROVIDER_SITE_OTHER): Payer: PPO | Admitting: Family Medicine

## 2017-07-17 VITALS — BP 168/92 | HR 89 | Temp 98.8°F | Ht 64.0 in | Wt 187.2 lb

## 2017-07-17 DIAGNOSIS — R911 Solitary pulmonary nodule: Secondary | ICD-10-CM | POA: Diagnosis not present

## 2017-07-17 DIAGNOSIS — R05 Cough: Secondary | ICD-10-CM

## 2017-07-17 DIAGNOSIS — R059 Cough, unspecified: Secondary | ICD-10-CM

## 2017-07-17 NOTE — Patient Instructions (Signed)
Great to see you. Please stop by to see [facility] on your way out.

## 2017-07-17 NOTE — Progress Notes (Signed)
Subjective:   Patient ID: Christy Freeman, female    DOB: [birth year], 67 y.o.   MRN: [id number]  Christy Freeman is a pleasant 67 y.o. year old female who presents to clinic today with Follow-up (Patient is here today to F/U from CXR and labs completed.  She is taking the abx but is having diarrhea.  )  on 07/17/2017  HPI:  Saw myself on 06/26/17 and then Dr. Ethelene Hal on 07/13/17 for cough. Notes reviewed.  I placed her on doxycyline for URI.  She completed the course of doxycyline but cough persisted so she RTC to see Dr. Ethelene Hal.  WBC was trending down but still a little elevated at 12.0   Per Dr. Ethelene Hal, Temelec showed ? LLL PNA- Placed on Augmentin, prednisone and Tessalon. Here today because she is still having diarrhea.  Cough has improved significantly.   Dg Chest 2 View  Result Date: 07/13/2017 CLINICAL DATA:  67 year old female with a history of cough EXAM: CHEST  2 VIEW COMPARISON:  Chest x-ray 10/13/2013, CT chest 12/15/2013, 01/28/2015 FINDINGS: Cardiomediastinal silhouette unchanged in size and contour. No evidence of central vascular congestion. No interlobular septal thickening. No confluent airspace disease. No pneumothorax or pleural effusion. Chronic interstitial opacities, more pronounced than the prior, though this is favored to be technique related. No displaced fracture. IMPRESSION: Chronic lung changes without evidence of superimposed acute cardiopulmonary disease Electronically Signed   By: Corrie Mckusick D.O.   On: 07/13/2017 13:12   She did have a CT of chest done 01/28/15 by Dr. Pennie Banter (as a follow up lung nodule done the previous year).  Appeared benign but advised repeat CT in 8-12 months.  She was lost to follow up and would like to have this scheduled.  Current Outpatient Medications on File Prior to Visit  Medication Sig Dispense Refill  . amLODipine (NORVASC) 10 MG tablet Take 1 tablet (10 mg total) daily by mouth. 30 tablet 3  . amoxicillin-clavulanate (AUGMENTIN)  875-125 MG tablet Take 1 tablet by mouth 2 (two) times daily. 20 tablet 0  . aspirin EC 81 MG tablet Take 81 mg by mouth 2 (two) times daily.    . benzonatate (TESSALON) 200 MG capsule Take 1 capsule (200 mg total) by mouth 2 (two) times daily as needed for cough. 20 capsule 0  . lisinopril (PRINIVIL,ZESTRIL) 40 MG tablet take 1 tablet by mouth once daily 90 tablet 3  . metoprolol succinate (TOPROL-XL) 100 MG 24 hr tablet take 1 tablet by mouth once daily 30 tablet 11  . omeprazole (PRILOSEC) 10 MG capsule Take 1 capsule (10 mg total) by mouth daily. 90 capsule 3  . predniSONE (DELTASONE) 20 MG tablet Take 1 tablet (20 mg total) by mouth 2 (two) times daily with a meal for 7 days. 14 tablet 0  . Probiotic Product (PROBIOTIC DAILY PO) Take 1 capsule by mouth as needed.     . rosuvastatin (CRESTOR) 5 MG tablet take 1 tablet by mouth at bedtime 30 tablet 3  . venlafaxine XR (EFFEXOR XR) 75 MG 24 hr capsule Take 1 capsule (75 mg total) by mouth daily with breakfast. 30 capsule 3   No current facility-administered medications on file prior to visit.     No Known Allergies  Past Medical History:  Diagnosis Date  . Alcohol abuse   . Blood transfusion without reported diagnosis   . Cancer (HCC)    basil cell on face  . Colon polyps   . GERD (gastroesophageal reflux disease)   .  H/O: depression   . Hypercholesterolemia    diet controlled  . Hypertension   . Lung nodule    left lung   . Thyroid disease     Past Surgical History:  Procedure Laterality Date  . APPENDECTOMY  06/14/2017  . COLONOSCOPY    . CYST REMOVAL HAND Left   . DILATION AND CURETTAGE OF UTERUS    . LAPAROSCOPIC APPENDECTOMY N/A 06/14/2017   Procedure: APPENDECTOMY LAPAROSCOPIC;  Surgeon: Clayburn Pert, MD;  Location: [facility] ORS;  Service: General;  Laterality: N/A;    Family History  Problem Relation Age of Onset  . Diabetes Mother   . Heart failure Mother   . Hypertension Mother   . Stroke Father   .  Hypertension Father   . Heart disease Brother   . Stroke Maternal Grandmother   . Colon cancer Neg Hx     Social History   Socioeconomic History  . Marital status: Married    Spouse name: Not on file  . Number of children: Not on file  . Years of education: Not on file  . Highest education level: Not on file  Social Needs  . Financial resource strain: Not on file  . Food insecurity - worry: Not on file  . Food insecurity - inability: Not on file  . Transportation needs - medical: Not on file  . Transportation needs - non-medical: Not on file  Occupational History  . Not on file  Tobacco Use  . Smoking status: Never Smoker  . Smokeless tobacco: Never Used  Substance and Sexual Activity  . Alcohol use: Yes    Alcohol/week: 8.4 oz    Types: 8 Standard drinks or equivalent, 6 Glasses of wine per week  . Drug use: No  . Sexual activity: Yes  Other Topics Concern  . Not on file  Social History Narrative   She would desire CPR.   Does have a living will.   The PMH, PSH, Social History, Family History, Medications, and allergies have been reviewed in [facility], and have been updated if relevant.   Review of Systems  Constitutional: Negative.   HENT: Negative.   Respiratory: Positive for cough. Negative for apnea, choking, chest tightness, shortness of breath, wheezing and stridor.   Gastrointestinal: Positive for diarrhea. Negative for abdominal distention and nausea.  Genitourinary: Negative.   Musculoskeletal: Negative.   Skin: Negative.   Hematological: Negative.   Psychiatric/Behavioral: Negative.   All other systems reviewed and are negative.      Objective:    BP (!) 168/92 (BP Location: Left Arm, Patient Position: Sitting, Cuff Size: Normal)   Pulse 89   Temp 98.8 F (37.1 C) (Oral)   Ht 5' 4"  (1.626 m)   Wt 187 lb 3.2 oz (84.9 kg)   SpO2 98%   BMI 32.13 kg/m    Physical Exam  Constitutional: She is oriented to person, place, and time. She appears  well-developed and well-nourished. No distress.  HENT:  Head: Normocephalic and atraumatic.  Eyes: Conjunctivae are normal.  Cardiovascular: Normal rate and regular rhythm.  Pulmonary/Chest: Effort normal and breath sounds normal. No respiratory distress. She has no wheezes. She has no rales.  Musculoskeletal: Normal range of motion.  Neurological: She is alert and oriented to person, place, and time. No cranial nerve deficit.  Skin: Skin is warm and dry. She is not diaphoretic.  Psychiatric: She has a normal mood and affect. Her behavior is normal. Judgment and thought content normal.  Nursing note and  vitals reviewed.         Assessment & Plan:   Solitary pulmonary nodule - Plan: CT Chest Wo Contrast  Cough No Follow-up on file.

## 2017-07-17 NOTE — Assessment & Plan Note (Signed)
Symptoms improving and CXR reassuring. Having significant diarrhea with Augmentin.  Advised to stop taking this as she likely no longer needs it. Finish course of prednisone. Call or return to clinic prn if these symptoms worsen or fail to improve as anticipated. The patient indicates understanding of these issues and agrees with the plan.

## 2017-07-17 NOTE — Assessment & Plan Note (Signed)
Follow up lung CT ordered.

## 2017-07-18 DIAGNOSIS — L308 Other specified dermatitis: Secondary | ICD-10-CM | POA: Diagnosis not present

## 2017-07-18 NOTE — Telephone Encounter (Signed)
Called and spoke with patient, she stated that she has an office visit with Dr. Deborra Medina and they will be the one to do the CT. Nothing further needed.

## 2017-07-18 NOTE — Telephone Encounter (Signed)
OV

## 2017-07-25 ENCOUNTER — Ambulatory Visit (INDEPENDENT_AMBULATORY_CARE_PROVIDER_SITE_OTHER)
Admission: RE | Admit: 2017-07-25 | Discharge: 2017-07-25 | Disposition: A | Discharge status: Home / Self Care | Payer: PPO | Source: Ambulatory Visit | Attending: Family Medicine | Admitting: Family Medicine

## 2017-07-25 DIAGNOSIS — R911 Solitary pulmonary nodule: Secondary | ICD-10-CM

## 2017-07-30 DIAGNOSIS — L308 Other specified dermatitis: Secondary | ICD-10-CM | POA: Diagnosis not present

## 2017-07-31 ENCOUNTER — Other Ambulatory Visit: Payer: Self-pay

## 2017-07-31 MED ORDER — VENLAFAXINE HCL ER 75 MG PO CP24: 75.0000 mg | ORAL_CAPSULE | Freq: Every day | ORAL | 1 refills | Status: DC

## 2017-07-31 MED ORDER — OMEPRAZOLE 10 MG PO CPDR: 10.0000 mg | DELAYED_RELEASE_CAPSULE | Freq: Every day | ORAL | 1 refills | Status: DC

## 2017-08-03 NOTE — Progress Notes (Deleted)
Subjective:   Christy Freeman is a 67 y.o. female who presents for an Initial Medicare Annual Wellness Visit.  Review of Systems   No ROS.  Medicare Wellness Visit. Additional risk factors are reflected in the social history.   Sleep patterns:    Home Safety/Smoke Alarms: Feels safe in home. Smoke alarms in place.  Living environment; residence and Firearm Safety:  [place] Safety/Bike Helmet: Wears seat belt.   Female:   Pap- last 07/30/14-normal       Mammo- last 06/04/17: BI-RADS CATEGORY  3: Probably benign.       Dexa scan- Last 08/17/15-osteopenia        CCS- last 12/03/14: recall 5 yrs    Objective:    There were no vitals filed for this visit. There is no height or weight on file to calculate BMI.  Advanced Directives 06/14/2017 06/14/2017 08/03/2016 12/03/2014 11/19/2014  Does Patient Have a Medical Advance Directive? - No Yes Yes No  Type of Advance Directive - - [place];Living will - -  Copy of [place] in Chart? - - No - copy requested No - copy requested -  Would patient like information on creating a medical advance directive? No - Patient declined - - - -    Current Medications (verified) Outpatient Encounter Medications as of 08/08/2017  Medication Sig  . amLODipine (NORVASC) 10 MG tablet Take 1 tablet (10 mg total) daily by mouth.  Marland Kitchen amoxicillin-clavulanate (AUGMENTIN) 875-125 MG tablet Take 1 tablet by mouth 2 (two) times daily.  Marland Kitchen aspirin EC 81 MG tablet Take 81 mg by mouth 2 (two) times daily.  . benzonatate (TESSALON) 200 MG capsule Take 1 capsule (200 mg total) by mouth 2 (two) times daily as needed for cough.  Marland Kitchen lisinopril (PRINIVIL,ZESTRIL) 40 MG tablet take 1 tablet by mouth once daily  . metoprolol succinate (TOPROL-XL) 100 MG 24 hr tablet take 1 tablet by mouth once daily  . omeprazole (PRILOSEC) 10 MG capsule Take 1 capsule (10 mg total) by mouth daily.  . Probiotic Product (PROBIOTIC DAILY PO) Take 1 capsule by mouth  as needed.   . rosuvastatin (CRESTOR) 5 MG tablet take 1 tablet by mouth at bedtime  . venlafaxine XR (EFFEXOR XR) 75 MG 24 hr capsule Take 1 capsule (75 mg total) by mouth daily with breakfast.   No facility-administered encounter medications on file as of 08/08/2017.     Allergies (verified) Patient has no known allergies.   History: Past Medical History:  Diagnosis Date  . Alcohol abuse   . Blood transfusion without reported diagnosis   . Cancer (HCC)    basil cell on face  . Colon polyps   . GERD (gastroesophageal reflux disease)   . H/O: depression   . Hypercholesterolemia    diet controlled  . Hypertension   . Lung nodule    left lung   . Thyroid disease    Past Surgical History:  Procedure Laterality Date  . APPENDECTOMY  06/14/2017  . COLONOSCOPY    . CYST REMOVAL HAND Left   . DILATION AND CURETTAGE OF UTERUS    . LAPAROSCOPIC APPENDECTOMY N/A 06/14/2017   Procedure: APPENDECTOMY LAPAROSCOPIC;  Surgeon: Clayburn Pert, MD;  Location: [facility] ORS;  Service: General;  Laterality: N/A;   Family History  Problem Relation Age of Onset  . Diabetes Mother   . Heart failure Mother   . Hypertension Mother   . Stroke Father   . Hypertension Father   .  Heart disease Brother   . Stroke Maternal Grandmother   . Colon cancer Neg Hx    Social History   Socioeconomic History  . Marital status: Married    Spouse name: Not on file  . Number of children: Not on file  . Years of education: Not on file  . Highest education level: Not on file  Social Needs  . Financial resource strain: Not on file  . Food insecurity - worry: Not on file  . Food insecurity - inability: Not on file  . Transportation needs - medical: Not on file  . Transportation needs - non-medical: Not on file  Occupational History  . Not on file  Tobacco Use  . Smoking status: Never Smoker  . Smokeless tobacco: Never Used  Substance and Sexual Activity  . Alcohol use: Yes    Alcohol/week: 8.4 oz     Types: 8 Standard drinks or equivalent, 6 Glasses of wine per week  . Drug use: No  . Sexual activity: Yes  Other Topics Concern  . Not on file  Social History Narrative   She would desire CPR.   Does have a living will.    Tobacco Counseling Counseling given: Not Answered   Clinical Intake:                        Activities of Daily Living In your present state of health, do you have any difficulty performing the following activities: 06/14/2017 06/14/2017  Hearing? - N  Vision? - N  Difficulty concentrating or making decisions? - N  Walking or climbing stairs? - N  Dressing or bathing? - N  Doing errands, shopping? N -  Preparing Food and eating ? - -  Using the Toilet? - -  In the past six months, have you accidently leaked urine? - -  Do you have problems with loss of bowel control? - -  Managing your Medications? - -  Managing your Finances? - -  Housekeeping or managing your Housekeeping? - -  Some recent data might be hidden     Immunizations and Health Maintenance Immunization History  Administered Date(s) Administered  . Influenza Split 04/15/2013  . Influenza, Seasonal, Injecte, Preservative Fre 07/03/2016  . Influenza,inj,Quad PF,6+ Mos 05/12/2014  . Influenza-Unspecified 04/06/2017  . Pneumococcal Conjugate-13 08/03/2015  . Pneumococcal Polysaccharide-23 03/31/2013  . Td 08/08/2012  . Zoster 05/12/2012  . Zoster Recombinat (Shingrix) 03/15/2017   There are no preventive care reminders to display for this patient.  Patient Care Team: Lucille Passy, MD as PCP - General (Family Medicine) Minna Merritts, MD as Consulting Physician (Cardiology) Ladene Artist, MD as Consulting Physician (Gastroenterology)  Indicate any recent Medical Services you may have received from other than [facility] providers in the past year (date may be approximate).     Assessment:   This is a routine wellness examination for Christy Freeman. Physical assessment deferred  to PCP.  Hearing/Vision screen No exam data present  Dietary issues and exercise activities discussed:   Diet (meal preparation, eat out, water intake, caffeinated beverages, dairy products, fruits and vegetables): {Desc; diets:16563} Breakfast: Lunch:  Dinner:      Goals    . LDL CALC < 130     < 130 at least, and would prefer LDL < 100 if possible given family history      Depression Screen PHQ 2/9 Scores 06/26/2017 08/03/2016  PHQ - 2 Score 0 0    Fall Risk Fall  Risk  06/26/2017 08/03/2016  Falls in the past year? No No    Cognitive Function: MMSE - Mini Mental State Exam 08/03/2016  Orientation to time 5  Orientation to Place 5  Registration 3  Attention/ Calculation 0  Recall 3  Language- name 2 objects 0  Language- repeat 1  Language- follow 3 step command 3  Language- read & follow direction 0  Write a sentence 0  Copy design 0  Total score 20        Screening Tests Health Maintenance  Topic Date Due  . PNA vac Low Risk Adult (2 of 2 - PPSV23) 03/31/2018  . MAMMOGRAM  05/26/2019  . COLONOSCOPY  12/03/2019  . TETANUS/TDAP  08/09/2022  . INFLUENZA VACCINE  Completed  . DEXA SCAN  Completed  . Hepatitis C Screening  Completed   Plan:   ***  I have personally reviewed and noted the following in the patient's chart:   . Medical and social history . Use of alcohol, tobacco or illicit drugs  . Current medications and supplements . Functional ability and status . Nutritional status . Physical activity . Advanced directives . List of other physicians . Hospitalizations, surgeries, and ER visits in previous 12 months . Vitals . Screenings to include cognitive, depression, and falls . Referrals and appointments  In addition, I have reviewed and discussed with patient certain preventive protocols, quality metrics, and best practice recommendations. A written personalized care plan for preventive services as well as general preventive health recommendations  were provided to patient.     Shela Nevin, [place]   08/03/2017

## 2017-08-05 NOTE — Progress Notes (Signed)
Cardiology Office Note  Date:  08/07/2017   ID:  Christy Freeman, DOB 1950-07-26, MRN [id number]  PCP:  Lucille Passy, MD   Chief Complaint  Patient presents with  . Other    12 month follow up. Patient denies chest pain and SOB. Meds reviewed verbally with patient.     HPI:  67 y.o. Female with h/o fatty liver and  LDL  190-200 mg/dL,   Previous evaluation in the ER following chest pains, which were ultimately felt to be due to noncardiac chest pain, poorly controlled blood pressure,    Stress test negative.  lowered her LDL to 141 mg/dL in the past with high fiber / low fat diet, exercise, and 30 lbs weight loss.  She has regained all of this weight back   LDL back up to 191 mg/dL now.   She presents for evaluation of hyperlipidemia, aortic atherosclerosis  Previous CT scan showed no coronary calcifications, minimal descending aorta calcification, none in the proximal carotid arteries.   Recent diagnosis of appendicitis Had laparoscopic surgery In recovery developed pneumonia Finally recovered, now exercising Feels back to her baseline  Does not check blood pressure Misses some crestor Denies any chest pain concerning for angina No shortness of breath on exertion Denies any side effects from her medications She does yoga, no regular aerobic exercise  [place]  Scheduled to have lab work done tomorrow through primary care  EKG personally reviewed by myself on todays visit Shows normal sinus rhythm with rate 82 bpm no significant ST or T-wave changes  Other past medical history Family history:  Mother (MI at 52 y.o. ; Diabetes at age 65 y.o.).  Father (TIA at 72 y.o.);  Brother (MI at 46 y.o.) Social History:  Drinks wine 4-6 glasses per week  Previously drank 4 nights per week.  She stopped 2009-2011 after seeing fatty liver results, but restarted in 2011 after stress levels increased.   No tobacco use.    Liver history:  Patient diagnosed with  fatty liver on U/S 06/2007 by Dr. Gaye Pollack [facility], [place]).  Her LFTs went all the way to normal she tells me once she stopped drinking alcohol in 2009 - then went up after restating alcohol.  He checks LFTs annually.  ALT/AST were in the 90's in 03/06/13, then improved to 40's late 03/24/13 after she reduced her alcohol consumption from 4 days per week down to 2 nights per week (2-3 glasses per day).     Labs:  TC 274, TG 135, HDL 60, LDL191, Glucose 99, ALT 47, AST 34 (03/24/2013) -  PMH:   has a past medical history of Alcohol abuse, Blood transfusion without reported diagnosis, Cancer ([place]), Colon polyps, GERD (gastroesophageal reflux disease), H/O: depression, Hypercholesterolemia, Hypertension, Lung nodule, and Thyroid disease.  PSH:    Past Surgical History:  Procedure Laterality Date  . APPENDECTOMY  06/14/2017  . COLONOSCOPY    . CYST REMOVAL HAND Left   . DILATION AND CURETTAGE OF UTERUS    . LAPAROSCOPIC APPENDECTOMY N/A 06/14/2017   Procedure: APPENDECTOMY LAPAROSCOPIC;  Surgeon: Clayburn Pert, MD;  Location: [facility] ORS;  Service: General;  Laterality: N/A;    Current Outpatient Medications  Medication Sig Dispense Refill  . amLODipine (NORVASC) 10 MG tablet take 1 tablet by mouth once daily 30 tablet 1  . aspirin EC 81 MG tablet Take 81 mg by mouth 2 (two) times daily.    Marland Kitchen lisinopril (PRINIVIL,ZESTRIL) 40 MG tablet take  1 tablet by mouth once daily 90 tablet 3  . metoprolol succinate (TOPROL-XL) 100 MG 24 hr tablet take 1 tablet by mouth once daily 30 tablet 11  . omeprazole (PRILOSEC) 10 MG capsule Take 1 capsule (10 mg total) by mouth daily. 90 capsule 1  . Probiotic Product (PROBIOTIC DAILY PO) Take 1 capsule by mouth as needed.     . rosuvastatin (CRESTOR) 5 MG tablet take 1 tablet by mouth at bedtime 30 tablet 3  . venlafaxine XR (EFFEXOR XR) 75 MG 24 hr capsule Take 1 capsule (75 mg total) by mouth daily with breakfast. 90 capsule 1   No current facility-administered  medications for this visit.      Allergies:   Patient has no known allergies.   Social History:  The patient  reports that  has never smoked. she has never used smokeless tobacco. She reports that she drinks about 8.4 oz of alcohol per week. She reports that she does not use drugs.   Family History:   family history includes Diabetes in her mother; Heart disease in her brother; Heart failure in her mother; Hypertension in her father and mother; Stroke in her father and maternal grandmother.    Review of Systems: Review of Systems  Constitutional: Negative.   Respiratory: Negative.   Cardiovascular: Negative.   Gastrointestinal: Negative.   Musculoskeletal: Negative.   Neurological: Negative.   Psychiatric/Behavioral: Negative.   All other systems reviewed and are negative.    PHYSICAL EXAM: VS:  BP (!) 150/76 (BP Location: Left Arm, Patient Position: Sitting, Cuff Size: Normal)   Pulse 82   Ht 5' 4"  (1.626 m)   Wt 190 lb (86.2 kg)   BMI 32.61 kg/m  , BMI Body mass index is 32.61 kg/m. Constitutional:  oriented to person, place, and time. No distress.  HENT:  Head: Normocephalic and atraumatic.  Eyes:  no discharge. No scleral icterus.  Neck: Normal range of motion. Neck supple. No JVD present.  Cardiovascular: Normal rate, regular rhythm, normal heart sounds and intact distal pulses. Exam reveals no gallop and no friction rub. No edema No murmur heard. Pulmonary/Chest: Effort normal and breath sounds normal. No stridor. No respiratory distress.  no wheezes.  no rales.  no tenderness.  Abdominal: Soft.  no distension.  no tenderness.  Musculoskeletal: Normal range of motion.  no  tenderness or deformity.  Neurological:  normal muscle tone. Coordination normal. No atrophy Skin: Skin is warm and dry. No rash noted. not diaphoretic.  Psychiatric:  normal mood and affect. behavior is normal. Thought content normal.       Recent Labs: 06/14/2017: ALT 34; BUN 8; Creatinine,  Ser 0.53; Potassium 3.4; Sodium 127 07/13/2017: Hemoglobin 13.5; Platelets 292    Lipid Panel Lab Results  Component Value Date   CHOL 217 (H) 08/03/2016   HDL 69.80 08/03/2016   LDLCALC 126 (H) 08/03/2016   TRIG 108.0 08/03/2016      Wt Readings from Last 3 Encounters:  08/07/17 190 lb (86.2 kg)  07/17/17 187 lb 3.2 oz (84.9 kg)  07/13/17 186 lb (84.4 kg)       ASSESSMENT AND PLAN:  Essential hypertension - Plan: EKG 12-Lead Blood pressure running high today but she feels it has whitecoat syndrome Elevated on previous office visits Does not check her blood pressure at home. Recommended she start to monitor resting blood pressures at home and write them down Seems to take all of her medications in the morning, need to make sure  she is not running high overnight and in early morning  She will check blood pressure at different times and bring it in when she sees Dr. Deborra Medina next week If blood pressure does run high potentially could change lisinopril to the lisinopril HCTZ  Hyperlipidemia, unspecified hyperlipidemia type - Plan: EKG 12-Lead Tolerating Zetia Missing some doses of the Crestor Recommended she take Crestor the morning rather than missed doses  Aortic atherosclerosis (HCC) Minimal aortic atherosclerosis on prior CT scan  Class 1 obesity due to excess calories without serious comorbidity with body mass index (BMI) of 30.0 to 30.9 in adult We have encouraged continued exercise, careful diet management in an effort to lose weight.   Total encounter time more than 25 minutes  Greater than 50% was spent in counseling and coordination of care with the patient  Disposition:   F/U  12 months   Orders Placed This Encounter  Procedures  . EKG 12-Lead     Signed, Esmond Plants, M.D., Ph.D. 08/07/2017  [place], [place]

## 2017-08-06 ENCOUNTER — Other Ambulatory Visit: Payer: Self-pay | Admitting: Cardiovascular Disease

## 2017-08-07 ENCOUNTER — Encounter: Payer: Self-pay | Admitting: Cardiovascular Disease

## 2017-08-07 ENCOUNTER — Ambulatory Visit (INDEPENDENT_AMBULATORY_CARE_PROVIDER_SITE_OTHER): Payer: PPO | Admitting: Cardiovascular Disease

## 2017-08-07 VITALS — BP 150/76 | HR 82 | Ht 64.0 in | Wt 190.0 lb

## 2017-08-07 DIAGNOSIS — I700 Atherosclerosis of aorta: Secondary | ICD-10-CM

## 2017-08-07 DIAGNOSIS — I10 Essential (primary) hypertension: Secondary | ICD-10-CM | POA: Diagnosis not present

## 2017-08-07 DIAGNOSIS — E785 Hyperlipidemia, unspecified: Secondary | ICD-10-CM

## 2017-08-07 NOTE — Patient Instructions (Addendum)
Please monitor blood pressure We could consider lisinopril HCTZ if needed   Medication Instructions:   No medication changes made  Labwork:  No new labs needed  Testing/Procedures:  No further testing at this time   Follow-Up: It was a pleasure seeing you in the office today. Please call us if you have new issues that need to be addressed before your next appt.  [phone]  Your physician wants you to follow-up in: 12 months as needed You will receive a reminder letter in the mail two months in advance. If you don't receive a letter, please call our office to schedule the follow-up appointment.  If you need a refill on your cardiac medications before your next appointment, please call your pharmacy.  For educational health videos Log in to : [email or web address] Or : [email or web address], password : [email or web address]

## 2017-08-08 ENCOUNTER — Ambulatory Visit: Payer: PPO | Admitting: Behavioral Health

## 2017-08-13 ENCOUNTER — Ambulatory Visit: Payer: Self-pay

## 2017-08-13 MED ORDER — BENZONATATE 200 MG PO CAPS: 200.0000 mg | ORAL_CAPSULE | Freq: Two times a day (BID) | ORAL | 0 refills | Status: DC | PRN

## 2017-08-13 MED ORDER — AMOXICILLIN-POT CLAVULANATE 875-125 MG PO TABS: 1.0000 | ORAL_TABLET | Freq: Two times a day (BID) | ORAL | 0 refills | Status: AC

## 2017-08-13 NOTE — Telephone Encounter (Signed)
Let's go ahead and refill her tessalon persles and send in eRx for Augmentin 875 twice daily x 10 days.  I would advise a probiotic to help with the diarrhea.  Please keep [place] updated.

## 2017-08-13 NOTE — Progress Notes (Signed)
Subjective:   Christy Freeman is a 67 y.o. female who presents for an Initial Medicare Annual Wellness Visit. The Patient was informed that the wellness visit is to identify future health risk and educate and initiate measures that can reduce risk for increased disease through the lifespan.   Describes health as fair, good or great? Great  Review of Systems   No ROS.  Medicare Wellness Visit. Additional risk factors are reflected in the social history. Cardiac Risk Factors include: advanced age (>89mn, >>69women);dyslipidemia;hypertension Sleep patterns: Goes to bed at 9p. Up at 5 am. Sleeps well.  Home Safety/Smoke Alarms: Feels safe in home. Smoke alarms in place.  Living environment; residence and Firearm Safety: 1 story home. Lives with husband and 2 dogs. Seat Belt Safety/Bike Helmet: Wears seat belt.   Female:   Pap- last 07/30/14-normal      Mammo- scheduled 08/29/17      Dexa scan-last 08/17/15-osteopenia       CCS- last 12/03/14-recall 5 yrs [place] Dr[facility]: Dr.Jewson- yearly    Objective:    Today's Vitals   08/15/17 1437  BP: (!) 142/82  Pulse: 77  SpO2: 96%  Weight: 188 lb 3.2 oz (85.4 kg)   Body mass index is 32.3 kg/m.  Advanced Directives 08/15/2017 06/14/2017 06/14/2017 08/03/2016 12/03/2014 11/19/2014  Does Patient Have a Medical Advance Directive? Yes - No Yes Yes No  Type of AParamedicof AV[place]Living will - - [place] will - -  Does patient want to make changes to medical advance directive? No - Patient declined - - - - -  Copy of [place]in Chart? No - copy requested - - No - copy requested No - copy requested -  Would patient like information on creating a medical advance directive? - No - Patient declined - - - -    Current Medications (verified) Outpatient Encounter Medications as of 08/15/2017  Medication Sig  . amLODipine (NORVASC) 10 MG tablet  take 1 tablet by mouth once daily  . amoxicillin-clavulanate (AUGMENTIN) 875-125 MG tablet Take 1 tablet by mouth 2 (two) times daily for 10 days.  .Marland Kitchenaspirin EC 81 MG tablet Take 81 mg by mouth 2 (two) times daily.  . benzonatate (TESSALON) 200 MG capsule Take 1 capsule (200 mg total) by mouth 2 (two) times daily as needed for up to 14 days for cough.  .Marland Kitchenlisinopril (PRINIVIL,ZESTRIL) 40 MG tablet take 1 tablet by mouth once daily  . metoprolol succinate (TOPROL-XL) 100 MG 24 hr tablet take 1 tablet by mouth once daily  . omeprazole (PRILOSEC) 10 MG capsule Take 1 capsule (10 mg total) by mouth daily.  . Probiotic Product (PROBIOTIC DAILY PO) Take 1 capsule by mouth as needed.   . rosuvastatin (CRESTOR) 5 MG tablet take 1 tablet by mouth at bedtime  . [DISCONTINUED] benzonatate (TESSALON) 200 MG capsule Take 1 capsule (200 mg total) by mouth 2 (two) times daily as needed for up to 14 days for cough.  . [DISCONTINUED] venlafaxine XR (EFFEXOR XR) 75 MG 24 hr capsule Take 1 capsule (75 mg total) by mouth daily with breakfast.  . [DISCONTINUED] rosuvastatin (CRESTOR) 5 MG tablet take 1 tablet by mouth at bedtime   No facility-administered encounter medications on file as of 08/15/2017.     Allergies (verified) Patient has no known allergies.   History: Past Medical History:  Diagnosis Date  . Alcohol abuse   .  Blood transfusion without reported diagnosis   . Cancer (HCC)    basil cell on face  . Colon polyps   . GERD (gastroesophageal reflux disease)   . H/O: depression   . Hypercholesterolemia    diet controlled  . Hypertension   . Lung nodule    left lung   . Thyroid disease    Past Surgical History:  Procedure Laterality Date  . APPENDECTOMY  06/14/2017  . COLONOSCOPY    . CYST REMOVAL HAND Left   . DILATION AND CURETTAGE OF UTERUS    . LAPAROSCOPIC APPENDECTOMY N/A 06/14/2017   Procedure: APPENDECTOMY LAPAROSCOPIC;  Surgeon: Clayburn Pert, MD;  Location: [facility] ORS;  Service:  General;  Laterality: N/A;   Family History  Problem Relation Age of Onset  . Diabetes Mother   . Heart failure Mother   . Hypertension Mother   . Stroke Father   . Hypertension Father   . Heart disease Brother   . Stroke Maternal Grandmother   . Colon cancer Neg Hx    Social History   Socioeconomic History  . Marital status: Married    Spouse name: None  . Number of children: None  . Years of education: None  . Highest education level: None  Social Needs  . Financial resource strain: None  . Food insecurity - worry: None  . Food insecurity - inability: None  . Transportation needs - medical: None  . Transportation needs - non-medical: None  Occupational History  . None  Tobacco Use  . Smoking status: Never Smoker  . Smokeless tobacco: Never Used  Substance and Sexual Activity  . Alcohol use: Yes    Alcohol/week: 8.4 oz    Types: 6 Glasses of wine, 8 Standard drinks or equivalent per week    Comment: 2 glasses of wine per night  . Drug use: No  . Sexual activity: Yes  Other Topics Concern  . None  Social History Narrative   She would desire CPR.   Does have a living will.    Tobacco Counseling Counseling given: Not Answered   Clinical Intake: Pain : No/denies pain    Activities of Daily Living In your present state of health, do you have any difficulty performing the following activities: 08/15/2017 06/14/2017  Hearing? Y -  Comment hearing aid right ear. -  Vision? N -  Comment wearing glases -  Difficulty concentrating or making decisions? N -  Walking or climbing stairs? N -  Dressing or bathing? N -  Doing errands, shopping? N N  Preparing Food and eating ? N -  Using the Toilet? N -  In the past six months, have you accidently leaked urine? N -  Do you have problems with loss of bowel control? N -  Managing your Medications? N -  Managing your Finances? N -  Housekeeping or managing your Housekeeping? N -  Some recent data might be hidden      Immunizations and Health Maintenance Immunization History  Administered Date(s) Administered  . Influenza Split 04/15/2013  . Influenza, Seasonal, Injecte, Preservative Fre 07/03/2016  . Influenza,inj,Quad PF,6+ Mos 05/12/2014  . Influenza-Unspecified 04/06/2017  . Pneumococcal Conjugate-13 08/03/2015  . Pneumococcal Polysaccharide-23 03/31/2013  . Td 08/08/2012  . Zoster 05/12/2012  . Zoster Recombinat (Shingrix) 03/15/2017   There are no preventive care reminders to display for this patient.  Patient Care Team: Lucille Passy, MD as PCP - General (Family Medicine) Minna Merritts, MD as Consulting Physician (Cardiology) Fuller Plan,  Pricilla Riffle, MD as Consulting Physician (Gastroenterology)  Indicate any recent Medical Services you may have received from other than [facility] providers in the past year (date may be approximate).     Assessment:   This is a routine wellness examination for [place]. Physical assessment deferred to PCP.  Hearing/Vision screen  Visual Acuity Screening   Right eye Left eye Both eyes  Without correction:     With correction: 20/20 20/20 20/20   Hearing Screening Comments: Able to hear conversational tones w/o difficulty. No issues reported.  Passed whisper test   Dietary issues and exercise activities discussed: Current Exercise Habits: Home exercise routine, Type of exercise: yoga;walking, Frequency (Times/Week): 7, Intensity: Mild Diet (meal preparation, eat out, water intake, caffeinated beverages, dairy products, fruits and vegetables): in general, a "healthy" diet  , well balanced    Goals    . LDL CALC < 130     < 130 at least, and would prefer LDL < 100 if possible given family history    . Weight (lb) < 170 lb (77.1 kg)     Continue with healthy diet and exercising everyday.      Depression Screen PHQ 2/9 Scores 08/15/2017 06/26/2017 08/03/2016  PHQ - 2 Score 0 0 0    Fall Risk Fall Risk  08/15/2017 06/26/2017 08/03/2016  Falls in the past  year? No No No    Cognitive Function: MMSE - Mini Mental State Exam 08/03/2016  Orientation to time 5  Orientation to Place 5  Registration 3  Attention/ Calculation 0  Recall 3  Language- name 2 objects 0  Language- repeat 1  Language- follow 3 step command 3  Language- read & follow direction 0  Write a sentence 0  Copy design 0  Total score 20        Screening Tests Health Maintenance  Topic Date Due  . PNA vac Low Risk Adult (2 of 2 - PPSV23) 03/31/2018  . MAMMOGRAM  05/26/2019  . COLONOSCOPY  12/03/2019  . TETANUS/TDAP  08/09/2022  . INFLUENZA VACCINE  Completed  . DEXA SCAN  Completed  . Hepatitis C Screening  Completed     Plan:   Follow up with Dr.Aron as scheduled 08/20/17.   Continue to eat heart healthy diet (full of fruits, vegetables, whole grains, lean protein, water--limit salt, fat, and sugar intake) and increase physical activity as tolerated.  Continue doing brain stimulating activities (puzzles, reading, adult coloring books, staying active) to keep memory sharp.   Bring a copy of your living will and/or healthcare power of attorney to your next office visit.  I have personally reviewed and noted the following in the patient's chart:   . Medical and social history . Use of alcohol, tobacco or illicit drugs  . Current medications and supplements . Functional ability and status . Nutritional status . Physical activity . Advanced directives . List of other physicians . Hospitalizations, surgeries, and ER visits in previous 12 months . Vitals . Screenings to include cognitive, depression, and falls . Referrals and appointments  In addition, I have reviewed and discussed with patient certain preventive protocols, quality metrics, and best practice recommendations. A written personalized care plan for preventive services as well as general preventive health recommendations were provided to patient.     Shela Nevin, [place]   08/15/2017

## 2017-08-13 NOTE — Telephone Encounter (Signed)
TA-Plz see note below/She states that she has the same Sx that she had last time and was advised to Christy Freeman/C the abx Augmentin due to the Diarrhea  She is asking for abx & tessalon perles/plz advise/thx dmf

## 2017-08-13 NOTE — Telephone Encounter (Signed)
Pt. Reports she has "a cough again like I had last month." States the "doctor told me to stop the Augmentin because I was having diarrhea. Can I just finish that medicine?" Instructed she needs an OV but states she just "wants something called in. Requests refill of Tessalon as well.Please advise pt.  Answer Assessment - Initial Assessment Questions 1. ONSET: "When did the cough begin?"      Started Last week 2. SEVERITY: "How bad is the cough today?"      Moderate 3. RESPIRATORY DISTRESS: "Describe your breathing."      No distress 4. FEVER: "Do you have a fever?" If so, ask: "What is your temperature, how was it measured, and when did it start?"     No 5. SPUTUM: "Describe the color of your sputum" (clear, white, yellow, green)     Yellow 6. HEMOPTYSIS: "Are you coughing up any blood?" If so ask: "How much?" (flecks, streaks, tablespoons, etc.)     No 7. CARDIAC HISTORY: "Do you have any history of heart disease?" (e.g., heart attack, congestive heart failure)      No 8. LUNG HISTORY: "Do you have any history of lung disease?"  (e.g., pulmonary embolus, asthma, emphysema)     No 9. PE RISK FACTORS: "Do you have a history of blood clots?" (or: recent major surgery, recent prolonged travel, bedridden )     No 10. OTHER SYMPTOMS: "Do you have any other symptoms?" (e.g., runny nose, wheezing, chest pain)       No 11. PREGNANCY: "Is there any chance you are pregnant?" "When was your last menstrual period?"       No 12. TRAVEL: "Have you traveled out of the country in the last month?" (e.g., travel history, exposures)       No  Protocols used: [place]

## 2017-08-13 NOTE — Telephone Encounter (Signed)
Sent in Augmenting bid #20 & Benzonatate 253m #30/advised pt this as well as to take probiotic per TA/thx dmf

## 2017-08-14 ENCOUNTER — Other Ambulatory Visit: Payer: Self-pay | Admitting: Family Medicine

## 2017-08-14 ENCOUNTER — Other Ambulatory Visit: Payer: Self-pay | Admitting: Cardiovascular Disease

## 2017-08-14 DIAGNOSIS — R059 Cough, unspecified: Secondary | ICD-10-CM

## 2017-08-14 DIAGNOSIS — R05 Cough: Secondary | ICD-10-CM

## 2017-08-15 ENCOUNTER — Other Ambulatory Visit: Payer: Self-pay

## 2017-08-15 ENCOUNTER — Ambulatory Visit (INDEPENDENT_AMBULATORY_CARE_PROVIDER_SITE_OTHER): Payer: PPO | Admitting: Behavioral Health

## 2017-08-15 ENCOUNTER — Telehealth: Payer: Self-pay | Admitting: Family Medicine

## 2017-08-15 ENCOUNTER — Encounter: Payer: Self-pay | Admitting: Behavioral Health

## 2017-08-15 VITALS — BP 142/82 | HR 77 | Wt 188.2 lb

## 2017-08-15 DIAGNOSIS — F320 Major depressive disorder, single episode, mild: Secondary | ICD-10-CM

## 2017-08-15 DIAGNOSIS — Z0000 Encounter for general adult medical examination without abnormal findings: Secondary | ICD-10-CM | POA: Diagnosis not present

## 2017-08-15 DIAGNOSIS — Z01419 Encounter for gynecological examination (general) (routine) without abnormal findings: Secondary | ICD-10-CM

## 2017-08-15 DIAGNOSIS — K2100 Gastro-esophageal reflux disease with esophagitis, without bleeding: Secondary | ICD-10-CM

## 2017-08-15 DIAGNOSIS — I10 Essential (primary) hypertension: Secondary | ICD-10-CM

## 2017-08-15 DIAGNOSIS — E785 Hyperlipidemia, unspecified: Secondary | ICD-10-CM

## 2017-08-15 DIAGNOSIS — E559 Vitamin D deficiency, unspecified: Secondary | ICD-10-CM

## 2017-08-15 MED ORDER — BENZONATATE 200 MG PO CAPS: 200.0000 mg | ORAL_CAPSULE | Freq: Two times a day (BID) | ORAL | 0 refills | Status: AC | PRN

## 2017-08-15 MED ORDER — VENLAFAXINE HCL ER 150 MG PO TB24: 1.0000 | ORAL_TABLET | Freq: Every day | ORAL | 0 refills | Status: DC

## 2017-08-15 NOTE — Patient Instructions (Signed)
Follow up with Dr.Aron as scheduled 08/20/17.   Continue to eat heart healthy diet (full of fruits, vegetables, whole grains, lean protein, water--limit salt, fat, and sugar intake) and increase physical activity as tolerated.  Continue doing brain stimulating activities (puzzles, reading, adult coloring books, staying active) to keep memory sharp.   Bring a copy of your living will and/or healthcare power of attorney to your next office visit.   Christy Freeman , Thank you for taking time to come for your Medicare Wellness Visit. I appreciate your ongoing commitment to your health goals. Please review the following plan we discussed and let me know if I can assist you in the future.   These are the goals we discussed: Goals    . LDL CALC < 130     < 130 at least, and would prefer LDL < 100 if possible given family history    . Weight (lb) < 170 lb (77.1 kg)     Continue with healthy diet and exercising everyday.       This is a list of the screening recommended for you and due dates:  Health Maintenance  Topic Date Due  . Pneumonia vaccines (2 of 2 - PPSV23) 03/31/2018  . Mammogram  05/26/2019  . Colon Cancer Screening  12/03/2019  . Tetanus Vaccine  08/09/2022  . Flu Shot  Completed  . DEXA scan (bone density measurement)  Completed  .  Hepatitis C: One time screening is recommended by Center for Disease Control  (CDC) for  adults born from [birth year] through 1965.   Completed    Health Maintenance for Postmenopausal Women Menopause is a normal process in which your reproductive ability comes to an end. This process happens gradually over a span of months to years, usually between the ages of 45 and 59. Menopause is complete when you have missed 12 consecutive menstrual periods. It is important to talk with your health care provider about some of the most common conditions that affect postmenopausal women, such as heart disease, cancer, and bone loss (osteoporosis). Adopting a healthy  lifestyle and getting preventive care can help to promote your health and wellness. Those actions can also lower your chances of developing some of these common conditions. What should I know about menopause? During menopause, you may experience a number of symptoms, such as:  Moderate-to-severe hot flashes.  Night sweats.  Decrease in sex drive.  Mood swings.  Headaches.  Tiredness.  Irritability.  Memory problems.  Insomnia.  Choosing to treat or not to treat menopausal changes is an individual decision that you make with your health care provider. What should I know about hormone replacement therapy and supplements? Hormone therapy products are effective for treating symptoms that are associated with menopause, such as hot flashes and night sweats. Hormone replacement carries certain risks, especially as you become older. If you are thinking about using estrogen or estrogen with progestin treatments, discuss the benefits and risks with your health care provider. What should I know about heart disease and stroke? Heart disease, heart attack, and stroke become more likely as you age. This may be due, in part, to the hormonal changes that your body experiences during menopause. These can affect how your body processes dietary fats, triglycerides, and cholesterol. Heart attack and stroke are both medical emergencies. There are many things that you can do to help prevent heart disease and stroke:  Have your blood pressure checked at least every 1-2 years. High blood pressure causes heart disease  and increases the risk of stroke.  If you are 65-93 years old, ask your health care provider if you should take aspirin to prevent a heart attack or a stroke.  Do not use any tobacco products, including cigarettes, chewing tobacco, or electronic cigarettes. If you need help quitting, ask your health care provider.  It is important to eat a healthy diet and maintain a healthy weight. ? Be  sure to include plenty of vegetables, fruits, low-fat dairy products, and lean protein. ? Avoid eating foods that are high in solid fats, added sugars, or salt (sodium).  Get regular exercise. This is one of the most important things that you can do for your health. ? Try to exercise for at least 150 minutes each week. The type of exercise that you do should increase your heart rate and make you sweat. This is known as moderate-intensity exercise. ? Try to do strengthening exercises at least twice each week. Do these in addition to the moderate-intensity exercise.  Know your numbers.Ask your health care provider to check your cholesterol and your blood glucose. Continue to have your blood tested as directed by your health care provider.  What should I know about cancer screening? There are several types of cancer. Take the following steps to reduce your risk and to catch any cancer development as early as possible. Breast Cancer  Practice breast self-awareness. ? This means understanding how your breasts normally appear and feel. ? It also means doing regular breast self-exams. Let your health care provider know about any changes, no matter how small.  If you are 47 or older, have a clinician do a breast exam (clinical breast exam or CBE) every year. Depending on your age, family history, and medical history, it may be recommended that you also have a yearly breast X-ray (mammogram).  If you have a family history of breast cancer, talk with your health care provider about genetic screening.  If you are at high risk for breast cancer, talk with your health care provider about having an MRI and a mammogram every year.  Breast cancer (BRCA) gene test is recommended for women who have family members with BRCA-related cancers. Results of the assessment will determine the need for genetic counseling and BRCA1 and for BRCA2 testing. BRCA-related cancers include these types: ? Breast. This occurs in  males or females. ? Ovarian. ? Tubal. This may also be called fallopian tube cancer. ? Cancer of the abdominal or pelvic lining (peritoneal cancer). ? Prostate. ? Pancreatic.  Cervical, Uterine, and Ovarian Cancer Your health care provider may recommend that you be screened regularly for cancer of the pelvic organs. These include your ovaries, uterus, and vagina. This screening involves a pelvic exam, which includes checking for microscopic changes to the surface of your cervix (Pap test).  For women ages 21-65, health care providers may recommend a pelvic exam and a Pap test every three years. For women ages 8-65, they may recommend the Pap test and pelvic exam, combined with testing for human papilloma virus (HPV), every five years. Some types of HPV increase your risk of cervical cancer. Testing for HPV may also be done on women of any age who have unclear Pap test results.  Other health care providers may not recommend any screening for nonpregnant women who are considered low risk for pelvic cancer and have no symptoms. Ask your health care provider if a screening pelvic exam is right for you.  If you have had past treatment for  cervical cancer or a condition that could lead to cancer, you need Pap tests and screening for cancer for at least 20 years after your treatment. If Pap tests have been discontinued for you, your risk factors (such as having a new sexual partner) need to be reassessed to determine if you should start having screenings again. Some women have medical problems that increase the chance of getting cervical cancer. In these cases, your health care provider may recommend that you have screening and Pap tests more often.  If you have a family history of uterine cancer or ovarian cancer, talk with your health care provider about genetic screening.  If you have vaginal bleeding after reaching menopause, tell your health care provider.  There are currently no reliable tests  available to screen for ovarian cancer.  Lung Cancer Lung cancer screening is recommended for adults 82-27 years old who are at high risk for lung cancer because of a history of smoking. A yearly low-dose CT scan of the lungs is recommended if you:  Currently smoke.  Have a history of at least 30 pack-years of smoking and you currently smoke or have quit within the past 15 years. A pack-year is smoking an average of one pack of cigarettes per day for one year.  Yearly screening should:  Continue until it has been 15 years since you quit.  Stop if you develop a health problem that would prevent you from having lung cancer treatment.  Colorectal Cancer  This type of cancer can be detected and can often be prevented.  Routine colorectal cancer screening usually begins at age 37 and continues through age 64.  If you have risk factors for colon cancer, your health care provider may recommend that you be screened at an earlier age.  If you have a family history of colorectal cancer, talk with your health care provider about genetic screening.  Your health care provider may also recommend using home test kits to check for hidden blood in your stool.  A small camera at the end of a tube can be used to examine your colon directly (sigmoidoscopy or colonoscopy). This is done to check for the earliest forms of colorectal cancer.  Direct examination of the colon should be repeated every 5-10 years until age 8. However, if early forms of precancerous polyps or small growths are found or if you have a family history or genetic risk for colorectal cancer, you may need to be screened more often.  Skin Cancer  Check your skin from head to toe regularly.  Monitor any moles. Be sure to tell your health care provider: ? About any new moles or changes in moles, especially if there is a change in a mole's shape or color. ? If you have a mole that is larger than the size of a pencil eraser.  If any  of your family members has a history of skin cancer, especially at a young age, talk with your health care provider about genetic screening.  Always use sunscreen. Apply sunscreen liberally and repeatedly throughout the day.  Whenever you are outside, protect yourself by wearing long sleeves, pants, a wide-brimmed hat, and sunglasses.  What should I know about osteoporosis? Osteoporosis is a condition in which bone destruction happens more quickly than new bone creation. After menopause, you may be at an increased risk for osteoporosis. To help prevent osteoporosis or the bone fractures that can happen because of osteoporosis, the following is recommended:  If you are 19-50 years  old, get at least 1,000 mg of calcium and at least 600 mg of vitamin D per day.  If you are older than age 65 but younger than age 75, get at least 1,200 mg of calcium and at least 600 mg of vitamin D per day.  If you are older than age 48, get at least 1,200 mg of calcium and at least 800 mg of vitamin D per day.  Smoking and excessive alcohol intake increase the risk of osteoporosis. Eat foods that are rich in calcium and vitamin D, and do weight-bearing exercises several times each week as directed by your health care provider. What should I know about how menopause affects my mental health? Depression may occur at any age, but it is more common as you become older. Common symptoms of depression include:  Low or sad mood.  Changes in sleep patterns.  Changes in appetite or eating patterns.  Feeling an overall lack of motivation or enjoyment of activities that you previously enjoyed.  Frequent crying spells.  Talk with your health care provider if you think that you are experiencing depression. What should I know about immunizations? It is important that you get and maintain your immunizations. These include:  Tetanus, diphtheria, and pertussis (Tdap) booster vaccine.  Influenza every year before the flu  season begins.  Pneumonia vaccine.  Shingles vaccine.  Your health care provider may also recommend other immunizations. This information is not intended to replace advice given to you by your health care provider. Make sure you discuss any questions you have with your health care provider. Document Released: 07/14/2005 Document Revised: 12/10/2015 Document Reviewed: 02/23/2015 Elsevier Interactive Patient Education  2018 [employer].

## 2017-08-15 NOTE — Telephone Encounter (Signed)
Pt states taking Effexor XR 164m not 776mdespite her requesting to wean off of it on 3.14.18 and that lower dosage being prescribed every since/per TA ok to send in [id number] will discuss at OV/thx dmf

## 2017-08-15 NOTE — Telephone Encounter (Signed)
This has been completed/thx dmf

## 2017-08-15 NOTE — Telephone Encounter (Signed)
Copied from [place] [id number]. Topic: Quick Communication - See Telephone Encounter >> Aug 15, 2017  9:41 AM Ahmed Prima L wrote: CRM for notification. See Telephone encounter for:   08/15/17.  [facility] called and said that the prescription for benzonatate (TESSALON) 200 MG capsule [Pharmacy Med Name: BENZONATATE Platinum [[id number]]  was never received by them. Please advise.

## 2017-08-16 ENCOUNTER — Other Ambulatory Visit: Payer: Self-pay

## 2017-08-20 ENCOUNTER — Encounter: Payer: Self-pay | Admitting: Family Medicine

## 2017-08-20 NOTE — Progress Notes (Signed)
Medical screening examination/treatment/procedure(s) were performed by the [employer], RN. As primary care provider I was immediately available for consulation/collaboration. I agree with above documentation. Wilfred Lacy, AGNP-C

## 2017-08-24 ENCOUNTER — Other Ambulatory Visit (INDEPENDENT_AMBULATORY_CARE_PROVIDER_SITE_OTHER): Payer: PPO

## 2017-08-24 DIAGNOSIS — E785 Hyperlipidemia, unspecified: Secondary | ICD-10-CM | POA: Diagnosis not present

## 2017-08-24 DIAGNOSIS — Z01419 Encounter for gynecological examination (general) (routine) without abnormal findings: Secondary | ICD-10-CM

## 2017-08-24 DIAGNOSIS — F320 Major depressive disorder, single episode, mild: Secondary | ICD-10-CM | POA: Diagnosis not present

## 2017-08-24 DIAGNOSIS — K2100 Gastro-esophageal reflux disease with esophagitis, without bleeding: Secondary | ICD-10-CM

## 2017-08-24 DIAGNOSIS — I10 Essential (primary) hypertension: Secondary | ICD-10-CM

## 2017-08-24 DIAGNOSIS — E559 Vitamin D deficiency, unspecified: Secondary | ICD-10-CM | POA: Diagnosis not present

## 2017-08-24 LAB — CBC WITH DIFFERENTIAL/PLATELET
BASOS PCT: 0.8 % (ref 0.0–3.0)
Basophils Absolute: 0.1 10*3/uL (ref 0.0–0.1)
EOS ABS: 0.1 10*3/uL (ref 0.0–0.7)
Eosinophils Relative: 1.2 % (ref 0.0–5.0)
HCT: 41 % (ref 36.0–46.0)
HEMOGLOBIN: 14 g/dL (ref 12.0–15.0)
Lymphocytes Relative: 32 % (ref 12.0–46.0)
Lymphs Abs: 3 10*3/uL (ref 0.7–4.0)
MCHC: 34.1 g/dL (ref 30.0–36.0)
MCV: 89.9 fl (ref 78.0–100.0)
MONO ABS: 0.7 10*3/uL (ref 0.1–1.0)
Monocytes Relative: 7.9 % (ref 3.0–12.0)
NEUTROS PCT: 58.1 % (ref 43.0–77.0)
Neutro Abs: 5.5 10*3/uL (ref 1.4–7.7)
Platelets: 306 10*3/uL (ref 150.0–400.0)
RBC: 4.57 Mil/uL (ref 3.87–5.11)
RDW: 13.4 % (ref 11.5–15.5)
WBC: 9.5 10*3/uL (ref 4.0–10.5)

## 2017-08-24 LAB — COMPREHENSIVE METABOLIC PANEL
ALBUMIN: 4.6 g/dL (ref 3.5–5.2)
ALT: 36 U/L — ABNORMAL HIGH (ref 0–35)
AST: 31 U/L (ref 0–37)
Alkaline Phosphatase: 128 U/L — ABNORMAL HIGH (ref 39–117)
BUN: 11 mg/dL (ref 6–23)
CHLORIDE: 94 meq/L — AB (ref 96–112)
CO2: 24 meq/L (ref 19–32)
Calcium: 9.9 mg/dL (ref 8.4–10.5)
Creatinine, Ser: 0.51 mg/dL (ref 0.40–1.20)
GFR: 127.8 mL/min (ref >60.00)
GLUCOSE: 101 mg/dL — AB (ref 70–99)
Potassium: 4.4 mEq/L (ref 3.5–5.1)
SODIUM: 132 meq/L — AB (ref 135–145)
Total Bilirubin: 0.5 mg/dL (ref 0.2–1.2)
Total Protein: 8.3 g/dL (ref 6.0–8.3)

## 2017-08-24 LAB — LIPID PANEL
CHOLESTEROL: 250 mg/dL — AB (ref 0–200)
HDL: 72.7 mg/dL (ref >39.00)
LDL CALC: 152 mg/dL — AB (ref 0–99)
NONHDL: 177.41
Total CHOL/HDL Ratio: 3
Triglycerides: 127 mg/dL (ref 0.0–149.0)
VLDL: 25.4 mg/dL (ref 0.0–40.0)

## 2017-08-24 LAB — VITAMIN D 25 HYDROXY (VIT D DEFICIENCY, FRACTURES): VITD: 25.35 ng/mL — ABNORMAL LOW (ref 30.00–100.00)

## 2017-08-29 ENCOUNTER — Ambulatory Visit
Admission: RE | Admit: 2017-08-29 | Discharge: 2017-08-29 | Disposition: A | Discharge status: Home / Self Care | Payer: PPO | Source: Ambulatory Visit | Attending: Family Medicine | Admitting: Family Medicine

## 2017-08-29 ENCOUNTER — Other Ambulatory Visit: Payer: Self-pay | Admitting: Family Medicine

## 2017-08-29 DIAGNOSIS — N6310 Unspecified lump in the right breast, unspecified quadrant: Secondary | ICD-10-CM

## 2017-08-29 DIAGNOSIS — N6489 Other specified disorders of breast: Secondary | ICD-10-CM | POA: Diagnosis not present

## 2017-08-30 ENCOUNTER — Encounter: Payer: PPO | Admitting: Family Medicine

## 2017-09-03 ENCOUNTER — Other Ambulatory Visit: Payer: Self-pay

## 2017-09-03 MED ORDER — LISINOPRIL 40 MG PO TABS: 40.0000 mg | ORAL_TABLET | Freq: Every day | ORAL | 0 refills | Status: DC

## 2017-09-05 ENCOUNTER — Encounter: Payer: PPO | Admitting: Family Medicine

## 2017-09-10 ENCOUNTER — Other Ambulatory Visit: Payer: Self-pay

## 2017-09-10 MED ORDER — METOPROLOL SUCCINATE ER 100 MG PO TB24: 100.0000 mg | ORAL_TABLET | Freq: Every day | ORAL | 11 refills | Status: DC

## 2017-09-13 ENCOUNTER — Other Ambulatory Visit: Payer: Self-pay | Admitting: Family Medicine

## 2017-09-13 MED ORDER — VENLAFAXINE HCL ER 150 MG PO TB24: 1.0000 | ORAL_TABLET | Freq: Every day | ORAL | 5 refills | Status: DC

## 2017-09-13 NOTE — Telephone Encounter (Signed)
Copied from [place] [id number]. Topic: Quick Communication - Rx Refill/Question >> Sep 13, 2017 11:04 AM Oliver Pila B wrote: Medication: Venlafaxine HCl 150 MG TB24 [[id number]]  Has the patient contacted their pharmacy? Yes.   (Agent: If no, request that the patient contact the pharmacy for the refill.) Preferred Pharmacy (with phone number or street name): [facility] Agent: Please be advised that RX refills may take up to 3 business days. We ask that you follow-up with your pharmacy.

## 2017-09-13 NOTE — Telephone Encounter (Signed)
Venlafaxine 150 mg tab refill request  LOV where this was addressed is 08/16/16 with Dr. En[facility] - [address], [place]

## 2017-09-25 ENCOUNTER — Ambulatory Visit (INDEPENDENT_AMBULATORY_CARE_PROVIDER_SITE_OTHER): Payer: PPO | Admitting: Family Medicine

## 2017-09-25 ENCOUNTER — Other Ambulatory Visit (HOSPITAL_COMMUNITY)
Admission: RE | Admit: 2017-09-25 | Discharge: 2017-09-25 | Disposition: A | Discharge status: Home / Self Care | Payer: PPO | Source: Ambulatory Visit | Attending: Family Medicine | Admitting: Family Medicine

## 2017-09-25 ENCOUNTER — Encounter: Payer: Self-pay | Admitting: Family Medicine

## 2017-09-25 VITALS — BP 140/76 | HR 87 | Temp 98.8°F | Ht 64.25 in | Wt 190.8 lb

## 2017-09-25 DIAGNOSIS — I10 Essential (primary) hypertension: Secondary | ICD-10-CM | POA: Diagnosis not present

## 2017-09-25 DIAGNOSIS — Z0000 Encounter for general adult medical examination without abnormal findings: Secondary | ICD-10-CM

## 2017-09-25 DIAGNOSIS — K2100 Gastro-esophageal reflux disease with esophagitis, without bleeding: Secondary | ICD-10-CM

## 2017-09-25 DIAGNOSIS — F329 Major depressive disorder, single episode, unspecified: Secondary | ICD-10-CM | POA: Diagnosis not present

## 2017-09-25 DIAGNOSIS — E2839 Other primary ovarian failure: Secondary | ICD-10-CM

## 2017-09-25 DIAGNOSIS — E785 Hyperlipidemia, unspecified: Secondary | ICD-10-CM

## 2017-09-25 DIAGNOSIS — Z124 Encounter for screening for malignant neoplasm of cervix: Secondary | ICD-10-CM | POA: Diagnosis not present

## 2017-09-25 DIAGNOSIS — E559 Vitamin D deficiency, unspecified: Secondary | ICD-10-CM | POA: Insufficient documentation

## 2017-09-25 DIAGNOSIS — F32A Depression, unspecified: Secondary | ICD-10-CM

## 2017-09-25 DIAGNOSIS — Z01419 Encounter for gynecological examination (general) (routine) without abnormal findings: Secondary | ICD-10-CM

## 2017-09-25 MED ORDER — OMEPRAZOLE 10 MG PO CPDR: 10.0000 mg | DELAYED_RELEASE_CAPSULE | Freq: Every day | ORAL | 5 refills | Status: DC

## 2017-09-25 MED ORDER — LISINOPRIL 40 MG PO TABS: 40.0000 mg | ORAL_TABLET | Freq: Every day | ORAL | 5 refills | Status: DC

## 2017-09-25 MED ORDER — AMLODIPINE BESYLATE 10 MG PO TABS: 10.0000 mg | ORAL_TABLET | Freq: Every day | ORAL | 5 refills | Status: DC

## 2017-09-25 MED ORDER — ROSUVASTATIN CALCIUM 5 MG PO TABS: 5.0000 mg | ORAL_TABLET | Freq: Every day | ORAL | 5 refills | Status: DC

## 2017-09-25 MED ORDER — METOPROLOL SUCCINATE ER 100 MG PO TB24: 100.0000 mg | ORAL_TABLET | Freq: Every day | ORAL | 5 refills | Status: DC

## 2017-09-25 MED ORDER — VENLAFAXINE HCL ER 150 MG PO TB24: 1.0000 | ORAL_TABLET | Freq: Every day | ORAL | 5 refills | Status: DC

## 2017-09-25 MED ORDER — VITAMIN D (ERGOCALCIFEROL) 1.25 MG (50000 UNIT) PO CAPS: 50000.0000 [IU] | ORAL_CAPSULE | ORAL | 0 refills | Status: DC

## 2017-09-25 NOTE — Assessment & Plan Note (Signed)
Reviewed preventive care protocols, scheduled due services, and updated immunizations Discussed nutrition, exercise, diet, and healthy lifestyle.  Discussed USPSTF recommendations of cervical cancer screening.  She would prefer to have pap smear done today.

## 2017-09-25 NOTE — Progress Notes (Signed)
Subjective:   Patient ID: Christy Freeman, female    DOB: 11-22-1950, 67 y.o.   MRN: [id number]  Christy Freeman is a pleasant 67 y.o. year old female who presents to clinic today with Follow-up (Patient is here today for a F/U from [facility] with PAP.  Fasting labs completed on 3.22.19.)  on 09/25/2017  HPI:  Health Maintenance  Topic Date Due  . INFLUENZA VACCINE  01/03/2018  . PNA vac Low Risk Adult (2 of 2 - PPSV23) 03/31/2018  . MAMMOGRAM  05/26/2019  . COLONOSCOPY  12/03/2019  . TETANUS/TDAP  08/09/2022  . DEXA SCAN  Completed  . Hepatitis C Screening  Completed    Medicare wellness visit with Eduard Roux,  RN on 08/15/17.Marland Kitchen Notes reviewed.  HLD- was on Crestor daily but had not been taking it daily for months.  Just started taking it in the morning since she was forgetting to take it in the evening..    Depression- symptoms have been well controlled on current dose of Effexor. Denies any symptoms of anxiety or depression.     Lab Results  Component Value Date   CHOL 250 (H) 08/24/2017   HDL 72.70 08/24/2017   LDLCALC 152 (H) 08/24/2017   LDLDIRECT 191.0 07/30/2014   TRIG 127.0 08/24/2017   CHOLHDL 3 08/24/2017   Lab Results  Component Value Date   NA 132 (L) 08/24/2017   K 4.4 08/24/2017   CL 94 (L) 08/24/2017   CO2 24 08/24/2017   Lab Results  Component Value Date   ALT 36 (H) 08/24/2017   AST 31 08/24/2017   ALKPHOS 128 (H) 08/24/2017   BILITOT 0.5 08/24/2017   Lab Results  Component Value Date   CREATININE 0.51 08/24/2017   Lab Results  Component Value Date   TSH 1.82 08/03/2016     Current Outpatient Medications on File Prior to Visit  Medication Sig Dispense Refill  . aspirin EC 81 MG tablet Take 81 mg by mouth 2 (two) times daily.    . cetirizine (ZYRTEC) 10 MG tablet Take 10 mg by mouth daily.    . Probiotic Product (PROBIOTIC DAILY PO) Take 1 capsule by mouth as needed.      No current facility-administered medications on file prior to visit.       No Known Allergies  Past Medical History:  Diagnosis Date  . Alcohol abuse   . Blood transfusion without reported diagnosis   . Cancer (HCC)    basil cell on face  . Colon polyps   . GERD (gastroesophageal reflux disease)   . H/O: depression   . Hypercholesterolemia    diet controlled  . Hypertension   . Lung nodule    left lung   . Thyroid disease     Past Surgical History:  Procedure Laterality Date  . APPENDECTOMY  06/14/2017  . COLONOSCOPY    . CYST REMOVAL HAND Left   . DILATION AND CURETTAGE OF UTERUS    . LAPAROSCOPIC APPENDECTOMY N/A 06/14/2017   Procedure: APPENDECTOMY LAPAROSCOPIC;  Surgeon: Clayburn Pert, MD;  Location: [facility] ORS;  Service: General;  Laterality: N/A;    Family History  Problem Relation Age of Onset  . Diabetes Mother   . Heart failure Mother   . Hypertension Mother   . Stroke Father   . Hypertension Father   . Heart disease Brother   . Stroke Maternal Grandmother   . Colon cancer Neg Hx     Social History   Socioeconomic History  .  Marital status: Married    Spouse name: Not on file  . Number of children: Not on file  . Years of education: Not on file  . Highest education level: Not on file  Occupational History  . Not on file  Social Needs  . Financial resource strain: Not on file  . Food insecurity:    Worry: Not on file    Inability: Not on file  . Transportation needs:    Medical: Not on file    Non-medical: Not on file  Tobacco Use  . Smoking status: Never Smoker  . Smokeless tobacco: Never Used  Substance and Sexual Activity  . Alcohol use: Yes    Alcohol/week: 8.4 oz    Types: 6 Glasses of wine, 8 Standard drinks or equivalent per week    Comment: 2 glasses of wine per night  . Drug use: No  . Sexual activity: Yes  Lifestyle  . Physical activity:    Days per week: Not on file    Minutes per session: Not on file  . Stress: Not on file  Relationships  . Social connections:    Talks on phone: Not on  file    Gets together: Not on file    Attends religious service: Not on file    Active member of club or organization: Not on file    Attends meetings of clubs or organizations: Not on file    Relationship status: Not on file  . Intimate partner violence:    Fear of current or ex partner: Not on file    Emotionally abused: Not on file    Physically abused: Not on file    Forced sexual activity: Not on file  Other Topics Concern  . Not on file  Social History Narrative   She would desire CPR.   Does have a living will.   The PMH, PSH, Social History, Family History, Medications, and allergies have been reviewed in [facility], and have been updated if relevant.   Review of Systems  Constitutional: Negative.   HENT: Negative.   Eyes: Negative.   Respiratory: Negative.   Cardiovascular: Negative.   Gastrointestinal: Negative.   Endocrine: Negative.   Genitourinary: Negative.   Musculoskeletal: Negative.   Skin: Negative.   Allergic/Immunologic: Negative.   Neurological: Negative.   Hematological: Negative.   Psychiatric/Behavioral: Negative.   All other systems reviewed and are negative.      Objective:    BP 140/76 (BP Location: Left Arm, Cuff Size: Normal)   Pulse 87   Temp 98.8 F (37.1 C) (Oral)   Ht 5' 4.25" (1.632 m)   Wt 190 lb 12.8 oz (86.5 kg)   SpO2 96%   BMI 32.50 kg/m    Physical Exam    General:  Well-developed,well-nourished,in no acute distress; alert,appropriate and cooperative throughout examination Head:  normocephalic and atraumatic.   Eyes:  vision grossly intact, PERRL Ears:  R ear normal and L ear normal externally, TMs clear bilaterally Nose:  no external deformity.   Mouth:  good dentition.   Neck:  No deformities, masses, or tenderness noted. Breasts:  No mass, nodules, thickening, tenderness, bulging, retraction, inflamation, nipple discharge or skin changes noted.   Lungs:  Normal respiratory effort, chest expands symmetrically. Lungs  are clear to auscultation, no crackles or wheezes. Heart:  Normal rate and regular rhythm. S1 and S2 normal without gallop, murmur, click, rub or other extra sounds. Abdomen:  Bowel sounds positive,abdomen soft and non-tender without masses, organomegaly or  hernias noted. Rectal:  no external abnormalities.   Genitalia:  Pelvic Exam:        External: normal female genitalia without lesions or masses        Vagina: normal without lesions or masses        Cervix: normal without lesions or masses        Adnexa: normal bimanual exam without masses or fullness        Uterus: normal by palpation        Pap smear: performed Msk:  No deformity or scoliosis noted of thoracic or lumbar spine.   Extremities:  No clubbing, cyanosis, edema, or deformity noted with normal full range of motion of all joints.   Neurologic:  alert & oriented X3 and gait normal.   Skin:  Intact without suspicious lesions or rashes Cervical Nodes:  No lymphadenopathy noted Axillary Nodes:  No palpable lymphadenopathy Psych:  Cognition and judgment appear intact. Alert and cooperative with normal attention span and concentration. No apparent delusions, illusions, hallucinations     Assessment & Plan:   Medicare annual wellness visit, subsequent  Hyperlipidemia, unspecified hyperlipidemia type - Plan: rosuvastatin (CRESTOR) 5 MG tablet  Essential hypertension - Plan: amLODipine (NORVASC) 10 MG tablet, lisinopril (PRINIVIL,ZESTRIL) 40 MG tablet, metoprolol succinate (TOPROL-XL) 100 MG 24 hr tablet  Depression, unspecified depression type - Plan: Venlafaxine HCl 150 MG TB24  Gastroesophageal reflux disease with esophagitis - Plan: amLODipine (NORVASC) 10 MG tablet  Pap smear for cervical cancer screening - Plan: Cytology - PAP No follow-ups on file.

## 2017-09-25 NOTE — Assessment & Plan Note (Signed)
Deteriorated. Repeat Vit D 50000 x 7 weeks.

## 2017-09-25 NOTE — Assessment & Plan Note (Signed)
Well controlled on current dose of effexor.

## 2017-09-25 NOTE — Assessment & Plan Note (Signed)
Deteriorated.  Just restarted daily Crestor- I suspect her LDL will improve greatly with this change.

## 2017-09-25 NOTE — Assessment & Plan Note (Signed)
Well controlled. No changes made.

## 2017-09-25 NOTE — Patient Instructions (Signed)
Great to see you.  Please call the breast center at [phone] to schedule your bone density.  We are starting you on Vit D 50,000 IU weekly x 7 weeks.

## 2017-09-26 ENCOUNTER — Encounter: Payer: PPO | Admitting: Family Medicine

## 2017-09-27 LAB — CYTOLOGY - PAP
BACTERIAL VAGINITIS: NEGATIVE
CANDIDA VAGINITIS: NEGATIVE
Diagnosis: NEGATIVE
HPV (WINDOPATH): NOT DETECTED

## 2017-10-15 DIAGNOSIS — L570 Actinic keratosis: Secondary | ICD-10-CM | POA: Diagnosis not present

## 2017-10-15 DIAGNOSIS — I8391 Asymptomatic varicose veins of right lower extremity: Secondary | ICD-10-CM | POA: Diagnosis not present

## 2017-10-15 DIAGNOSIS — Z872 Personal history of diseases of the skin and subcutaneous tissue: Secondary | ICD-10-CM | POA: Diagnosis not present

## 2017-10-15 DIAGNOSIS — L578 Other skin changes due to chronic exposure to nonionizing radiation: Secondary | ICD-10-CM | POA: Diagnosis not present

## 2017-10-29 ENCOUNTER — Emergency Department
Admission: EM | Admit: 2017-10-29 | Discharge: 2017-10-29 | Disposition: A | Discharge status: Home / Self Care | Payer: PPO | Attending: Emergency Medicine | Admitting: Emergency Medicine

## 2017-10-29 ENCOUNTER — Encounter: Payer: Self-pay | Admitting: Emergency Medicine

## 2017-10-29 ENCOUNTER — Emergency Department: Payer: PPO

## 2017-10-29 ENCOUNTER — Other Ambulatory Visit: Payer: Self-pay

## 2017-10-29 DIAGNOSIS — Y929 Unspecified place or not applicable: Secondary | ICD-10-CM | POA: Insufficient documentation

## 2017-10-29 DIAGNOSIS — R51 Headache: Secondary | ICD-10-CM | POA: Diagnosis not present

## 2017-10-29 DIAGNOSIS — S199XXA Unspecified injury of neck, initial encounter: Secondary | ICD-10-CM | POA: Diagnosis not present

## 2017-10-29 DIAGNOSIS — Z85828 Personal history of other malignant neoplasm of skin: Secondary | ICD-10-CM | POA: Diagnosis not present

## 2017-10-29 DIAGNOSIS — Z7982 Long term (current) use of aspirin: Secondary | ICD-10-CM | POA: Diagnosis not present

## 2017-10-29 DIAGNOSIS — Y999 Unspecified external cause status: Secondary | ICD-10-CM | POA: Insufficient documentation

## 2017-10-29 DIAGNOSIS — M542 Cervicalgia: Secondary | ICD-10-CM | POA: Diagnosis not present

## 2017-10-29 DIAGNOSIS — S0081XA Abrasion of other part of head, initial encounter: Secondary | ICD-10-CM | POA: Insufficient documentation

## 2017-10-29 DIAGNOSIS — Y92009 Unspecified place in unspecified non-institutional (private) residence as the place of occurrence of the external cause: Secondary | ICD-10-CM

## 2017-10-29 DIAGNOSIS — S0083XA Contusion of other part of head, initial encounter: Secondary | ICD-10-CM | POA: Diagnosis not present

## 2017-10-29 DIAGNOSIS — Z79899 Other long term (current) drug therapy: Secondary | ICD-10-CM | POA: Diagnosis not present

## 2017-10-29 DIAGNOSIS — W0110XA Fall on same level from slipping, tripping and stumbling with subsequent striking against unspecified object, initial encounter: Secondary | ICD-10-CM | POA: Insufficient documentation

## 2017-10-29 DIAGNOSIS — I10 Essential (primary) hypertension: Secondary | ICD-10-CM | POA: Insufficient documentation

## 2017-10-29 DIAGNOSIS — T148XXA Other injury of unspecified body region, initial encounter: Secondary | ICD-10-CM

## 2017-10-29 DIAGNOSIS — Y9301 Activity, walking, marching and hiking: Secondary | ICD-10-CM | POA: Insufficient documentation

## 2017-10-29 DIAGNOSIS — S0990XA Unspecified injury of head, initial encounter: Secondary | ICD-10-CM | POA: Diagnosis not present

## 2017-10-29 DIAGNOSIS — W19XXXA Unspecified fall, initial encounter: Secondary | ICD-10-CM

## 2017-10-29 MED ORDER — HYDROCODONE-ACETAMINOPHEN 5-325 MG PO TABS
1.0000 | ORAL_TABLET | Freq: Four times a day (QID) | ORAL | 0 refills | Status: DC | PRN
Start: 2017-10-29 — End: 2018-01-21

## 2017-10-29 NOTE — ED Notes (Signed)
See triage note states she tripped this am  [facility] face first  Having headache  Swelling and abrasion noted to nose and forehead  No LOC

## 2017-10-29 NOTE — Discharge Instructions (Addendum)
Follow-up with your primary care provider if any continued problems.  Clean facial abrasions with mild soap and water and watch for any signs of infection.  Tramadol is for pain if needed.  Be aware that this medication could cause drowsiness and increase your risk for falling.  You may also continue taking ibuprofen or Tylenol if needed.

## 2017-10-29 NOTE — ED Provider Notes (Signed)
[facility] Emergency Department Provider Note   ____________________________________________   First MD Initiated Contact with Patient 10/29/17 1013     (approximate)  I have reviewed the triage vital signs and the nursing notes.   HISTORY  Chief Complaint Fall  HPI Christy Freeman is a 68 y.o. female is here with husband after she tripped over a water hose this morning.  Patient states that she had her face and then bounced across the surface.  She denies any loss of consciousness but now complains of headache.  Patient takes baby aspirin twice a day.  She denies any visual changes, nausea or vomiting.  Patient currently denies cervical pain.  She is continued to be ambulatory with minimal assistance.  Patient took ibuprofen prior to her arrival in the ED.  She rates her pain as a 5 out of 10.  Past Medical History:  Diagnosis Date  . Alcohol abuse   . Blood transfusion without reported diagnosis   . Cancer (HCC)    basil cell on face  . Colon polyps   . GERD (gastroesophageal reflux disease)   . H/O: depression   . Hypercholesterolemia    diet controlled  . Hypertension   . Lung nodule    left lung   . Thyroid disease     Patient Active Problem List   Diagnosis Date Noted  . Well woman exam with routine gynecological exam 09/25/2017  . Vitamin D deficiency 09/25/2017  . Aortic atherosclerosis ([place]) 02/10/2015  . Endometrial thickening on ultra sound 09/16/2014  . Postmenopausal HRT (hormone replacement therapy) 05/12/2014  . Solitary pulmonary nodule 10/13/2013  . Obesity 09/19/2013  . Steatohepatitis 09/19/2013  . Hypertension 02/05/2013  . GERD (gastroesophageal reflux disease) 02/05/2013  . Hyperlipidemia 02/05/2013  . Depression 02/05/2013    Past Surgical History:  Procedure Laterality Date  . APPENDECTOMY  06/14/2017  . COLONOSCOPY    . CYST REMOVAL HAND Left   . DILATION AND CURETTAGE OF UTERUS    . LAPAROSCOPIC APPENDECTOMY  N/A 06/14/2017   Procedure: APPENDECTOMY LAPAROSCOPIC;  Surgeon: Clayburn Pert, MD;  Location: [facility] ORS;  Service: General;  Laterality: N/A;    Prior to Admission medications   Medication Sig Start Date End Date Taking? Authorizing Provider  amLODipine (NORVASC) 10 MG tablet Take 1 tablet (10 mg total) by mouth daily. 09/25/17   Lucille Passy, MD  aspirin EC 81 MG tablet Take 81 mg by mouth 2 (two) times daily.    [provider]  cetirizine (ZYRTEC) 10 MG tablet Take 10 mg by mouth daily.    [provider]  HYDROcodone-acetaminophen (NORCO/VICODIN) 5-325 MG tablet Take 1 tablet by mouth every 6 (six) hours as needed for moderate pain. 10/29/17   Johnn Hai, PA-C  lisinopril (PRINIVIL,ZESTRIL) 40 MG tablet Take 1 tablet (40 mg total) by mouth daily. 09/25/17   Lucille Passy, MD  metoprolol succinate (TOPROL-XL) 100 MG 24 hr tablet Take 1 tablet (100 mg total) by mouth daily. Take with or immediately following a meal. 09/25/17   Lucille Passy, MD  omeprazole (PRILOSEC) 10 MG capsule Take 1 capsule (10 mg total) by mouth daily. 09/25/17   Lucille Passy, MD  Probiotic Product (PROBIOTIC DAILY PO) Take 1 capsule by mouth as needed.     [provider]  rosuvastatin (CRESTOR) 5 MG tablet Take 1 tablet (5 mg total) by mouth at bedtime. 09/25/17   Lucille Passy, MD  Venlafaxine HCl 150 MG  TB24 Take 1 tablet (150 mg total) by mouth daily. 09/25/17   Lucille Passy, MD  Vitamin D, Ergocalciferol, (DRISDOL) 50000 units CAPS capsule Take 1 capsule (50,000 Units total) by mouth every 7 (seven) days. 09/25/17   Lucille Passy, MD    Allergies Patient has no known allergies.  Family History  Problem Relation Age of Onset  . Diabetes Mother   . Heart failure Mother   . Hypertension Mother   . Stroke Father   . Hypertension Father   . Heart disease Brother   . Stroke Maternal Grandmother   . Colon cancer Neg Hx     Social History Social History   Tobacco Use  .  Smoking status: Never Smoker  . Smokeless tobacco: Never Used  Substance Use Topics  . Alcohol use: Yes    Alcohol/week: 8.4 oz    Types: 6 Glasses of wine, 8 Standard drinks or equivalent per week    Comment: 2 glasses of wine per night  . Drug use: No    Review of Systems Constitutional: No fever/chills Eyes: No visual changes. ENT: Positive abrasions noted to nose.  Negative for nasal bleed. Cardiovascular: Denies chest pain. Respiratory: Denies shortness of breath. Gastrointestinal: No abdominal pain.  No nausea, no vomiting.   Musculoskeletal: Negative for back pain. Skin: Positive for multiple facial abrasions. Neurological: Positive for headaches, negative for focal weakness or numbness. ____________________________________________   PHYSICAL EXAM:  VITAL SIGNS: ED Triage Vitals  Enc Vitals Group     BP 10/29/17 0941 (!) 193/90     Pulse Rate 10/29/17 0941 85     Resp 10/29/17 0941 18     Temp 10/29/17 0941 98.7 F (37.1 C)     Temp Source 10/29/17 0941 Oral     SpO2 10/29/17 0941 96 %     Weight 10/29/17 0940 190 lb (86.2 kg)     Height 10/29/17 0940 5' 4"  (1.626 m)     Head Circumference --      Peak Flow --      Pain Score 10/29/17 0939 5     Pain Loc --      Pain Edu? --      Excl. in [place]? --    Constitutional: Alert and oriented. Well appearing and in no acute distress.  Patient is talkative and answers questions appropriately.  Patient is ambulatory with minimal assistance to the bedside however she was ambulatory without assistance to CT department. Eyes: Conjunctivae are normal. PERRL. EOMI. Head: Atraumatic. Nose: No active nasal bleeding.  There is abrasions noted to the nose.  No foreign bodies are noted. Neck: No stridor.  No cervical tenderness is noted to palpation posteriorly. Cardiovascular: Normal rate, regular rhythm. Grossly normal heart sounds.  Good peripheral circulation. Respiratory: Normal respiratory effort.  No retractions. Lungs  CTAB. Gastrointestinal: Soft and nontender. No distention.  Musculoskeletal: Moves both upper and lower extremities without any difficulty.  Normal gait was noted.  Nontender to palpation. Neurologic:  Normal speech and language. No gross focal neurologic deficits are appreciated. No gait instability. Skin:  Skin is warm, dry.  Multiple facial abrasions as noted above.  Soft tissue edema present to the forehead with tenderness on palpation.  No active bleeding noted. Psychiatric: Mood and affect are normal. Speech and behavior are normal.  ____________________________________________   LABS (all labs ordered are listed, but only abnormal results are displayed)  Labs Reviewed - No data to display ____________________________________________  [place]   Official radiology  report(s): Ct Head Wo Contrast  Result Date: 10/29/2017 CLINICAL DATA:  Pain following fall EXAM: CT HEAD WITHOUT CONTRAST CT CERVICAL SPINE WITHOUT CONTRAST TECHNIQUE: Multidetector CT imaging of the head and cervical spine was performed following the standard protocol without intravenous contrast. Multiplanar CT image reconstructions of the cervical spine were also generated. COMPARISON:  None. FINDINGS: CT HEAD FINDINGS Brain: Ventricles and sulci are within normal limits for age. There is no intracranial mass, hemorrhage, extra-axial fluid collection, or midline shift. Gray-white compartments appear unremarkable. No acute infarct evident. Vascular: No hyperdense vessel. There is calcification in each carotid siphon region. Skull: Bony calvarium appears intact. There is osteoarthritic change in temporomandibular joints, more severe on the left than on the right. Sinuses/Orbits: There is mucosal thickening in several ethmoid air cells bilaterally. Other visualized paranasal sinuses are clear. Orbits appear symmetric bilaterally. Other: Mastoid air cells are clear. CT CERVICAL SPINE FINDINGS Alignment: There is no appreciable  spondylolisthesis. Skull base and vertebrae: Skull base and craniocervical junction regions appear normal. There is no demonstrable fracture. No blastic or lytic bone lesions. Soft tissues and spinal canal: Prevertebral soft tissues and predental space regions are normal. No paraspinous lesions. No evident cord or canal hematoma. Disc levels: There is moderate disc space narrowing at C4-5. Other disc spaces appear unremarkable. There is facet osteoarthritic changes several levels. There is bony hypertrophy on the left at C4-5 which is causing impression on the exiting nerve root. There is no frank disc extrusion or high-grade stenosis. Upper chest: Visualized upper lung zones are clear. Other: There is calcification in each carotid artery. IMPRESSION: CT head: No mass or hemorrhage. No acute infarct. Foci of vascular calcification noted. Mucosal thickening noted in several ethmoid air cells. 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 CT cervical spine: No demonstrable fracture or spondylolisthesis. Osteoarthritic change, most marked on the left at C4-5. Foci calcification in each carotid artery noted. Electronically Signed   By: Lowella Grip III M.D.   On: 10/29/2017 10:50   Ct Cervical Spine Wo Contrast  Result Date: 10/29/2017 CLINICAL DATA:  Pain following fall EXAM: CT HEAD WITHOUT CONTRAST CT CERVICAL SPINE WITHOUT CONTRAST TECHNIQUE: Multidetector CT imaging of the head and cervical spine was performed following the standard protocol without intravenous contrast. Multiplanar CT image reconstructions of the cervical spine were also generated. COMPARISON:  None. FINDINGS: CT HEAD FINDINGS Brain: Ventricles and sulci are within normal limits for age. There is no intracranial mass, hemorrhage, extra-axial fluid collection, or midline shift. Gray-white compartments appear unremarkable. No acute infarct evident. Vascular: No hyperdense vessel. There is calcification in each  carotid siphon region. Skull: Bony calvarium appears intact. There is osteoarthritic change in temporomandibular joints, more severe on the left than on the right. Sinuses/Orbits: There is mucosal thickening in several ethmoid air cells bilaterally. Other visualized paranasal sinuses are clear. Orbits appear symmetric bilaterally. Other: Mastoid air cells are clear. CT CERVICAL SPINE FINDINGS Alignment: There is no appreciable spondylolisthesis. Skull base and vertebrae: Skull base and craniocervical junction regions appear normal. There is no demonstrable fracture. No blastic or lytic bone lesions. Soft tissues and spinal canal: Prevertebral soft tissues and predental space regions are normal. No paraspinous lesions. No evident cord or canal hematoma. Disc levels: There is moderate disc space narrowing at C4-5. Other disc spaces appear unremarkable. There is facet osteoarthritic changes several levels. There is bony hypertrophy on the left at C4-5 which is causing impression on the exiting nerve root. There is no  frank disc extrusion or high-grade stenosis. Upper chest: Visualized upper lung zones are clear. Other: There is calcification in each carotid artery. IMPRESSION: CT head: No mass or hemorrhage. No acute infarct. Foci of vascular calcification noted. Mucosal thickening noted in several ethmoid air cells. 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 CT cervical spine: No demonstrable fracture or spondylolisthesis. Osteoarthritic change, most marked on the left at C4-5. Foci calcification in each carotid artery noted. Electronically Signed   By: Lowella Grip III M.D.   On: 10/29/2017 10:50    ____________________________________________   PROCEDURES  Procedure(s) performed: None  Procedures  Critical Care performed: No  ____________________________________________   INITIAL IMPRESSION / ASSESSMENT AND PLAN / ED COURSE  As part of my medical decision  making, I reviewed the following data within the electronic MEDICAL RECORD NUMBER Notes from prior ED visits and [place] Controlled Substance Database  Patient and family were reassured that CT scan was negative for head bleed.  Patient is up-to-date on tetanus immunization.  She was given information about watching abrasion for any signs of infection.  She is to use ice to reduce swelling to the soft tissue injury.  Patient is to return to the emergency department if any severe worsening of her symptoms such as nausea, vision changes, worsening of her headache.  She will follow-up with her PCP if any signs of infection.  ____________________________________________   FINAL CLINICAL IMPRESSION(S) / ED DIAGNOSES  Final diagnoses:  Contusion of face, initial encounter  Abrasion  Fall in home, initial encounter     ED Discharge Orders        Ordered    HYDROcodone-acetaminophen (NORCO/VICODIN) 5-325 MG tablet  Every 6 hours PRN     10/29/17 1109       Note:  This document was prepared using Dragon voice recognition software and may include unintentional dictation errors.    Johnn Hai, PA-C 10/29/17 1603    Delman Kitten, MD 10/29/17 [phone]

## 2017-10-29 NOTE — ED Triage Notes (Signed)
Pt tripped over hose while carrying something in both hands. Hit face.  C/o headache.  No LOC. No blood thinners.  Alert and oriented.  Ambulatory without distress.

## 2017-11-20 DIAGNOSIS — H5203 Hypermetropia, bilateral: Secondary | ICD-10-CM | POA: Diagnosis not present

## 2017-12-10 ENCOUNTER — Ambulatory Visit
Admission: RE | Admit: 2017-12-10 | Discharge: 2017-12-10 | Disposition: A | Discharge status: Home / Self Care | Payer: PPO | Source: Ambulatory Visit | Attending: Family Medicine | Admitting: Family Medicine

## 2017-12-10 DIAGNOSIS — E2839 Other primary ovarian failure: Secondary | ICD-10-CM | POA: Diagnosis not present

## 2017-12-10 DIAGNOSIS — M8589 Other specified disorders of bone density and structure, multiple sites: Secondary | ICD-10-CM | POA: Diagnosis not present

## 2018-01-09 ENCOUNTER — Encounter: Payer: Self-pay | Admitting: Cardiovascular Disease

## 2018-01-09 ENCOUNTER — Encounter: Payer: Self-pay | Admitting: Family Medicine

## 2018-01-15 ENCOUNTER — Telehealth: Payer: Self-pay | Admitting: Cardiovascular Disease

## 2018-01-15 NOTE — Telephone Encounter (Signed)
Pt is returning your call

## 2018-01-15 NOTE — Telephone Encounter (Signed)
Left voicemail message to call back  

## 2018-01-15 NOTE — Telephone Encounter (Signed)
Pt c/o BP issue: STAT if pt c/o blurred vision, one-sided weakness or slurred speech  1. What are your last 5 BP readings? Morning trend 160/90's decreases slightly around 11 after meds but never reaches "target range"   2. Are you having any other symptoms (ex. Dizziness, headache, blurred vision, passed out)? Not noted   3. What is your BP issue? Patient is not sure if bp cuff is accurate but is concerned about bp range and thinks she needs to be evaluated.  She is not sure if she needs to come in and see Dr. Rockey Situ for an ov or not.    See previous message mychart.

## 2018-01-16 NOTE — Telephone Encounter (Signed)
Patient called in stating that her blood pressure has been running high in the mornings prior to her medications. She did not have any blood pressure readings at the time of the phone call. She will fax these readings today for Dr. Donivan Scull review.  The patient currently takes the following medications in the morning: Amlodipine 10 mg Metoprolol Succinate 100 mg daily Lisinopril 40 mg daily  She stated that she also needs to have her wrist cuff and arm blood pressure cuffs calibrated to see if they are reading her blood pressures accurately. Will wait for the fax and then schedule her for the nurse visit for calibration.

## 2018-01-16 NOTE — Telephone Encounter (Signed)
Message left for the patient stating that the office has not received the blood pressure readings yet.

## 2018-01-17 NOTE — Telephone Encounter (Signed)
Fax received regarding BP readings.  Given to Dr. Donivan Scull nurse to review with MD.

## 2018-01-17 NOTE — Telephone Encounter (Signed)
Spoke with patient and reviewed that we received her letter with extensive concerns and blood pressure readings. Advised that maybe it would be better for her to come in for further evaluation to discuss this with the provider. Scheduled her to come in 01/24/18 at 08:40 AM. She was agreeable with this date and time with no further questions. Instructed her to continue monitoring her blood pressure readings and bring that in to that appointment. She was appreciative for the call with no further questions at this time.

## 2018-01-18 NOTE — Telephone Encounter (Signed)
Patient calling The 01/24/18 appointment will not work for her Will need another time Please advise

## 2018-01-18 NOTE — Telephone Encounter (Signed)
It appears the first available with Rockey Situ is around 9/20. We can offer her first available and put her on the waitlist. Rockey Situ has a new patient slot this Monday, but not sure if we can use it.

## 2018-01-19 NOTE — Progress Notes (Signed)
Cardiology Office Note  Date:  01/21/2018   ID:  Christy Freeman, DOB May 29, 1951, MRN [id number]  PCP:  Lucille Passy, MD   Chief Complaint  Patient presents with  . other    BP issues. Meds reviewed verbally with pt.    HPI:  67 y.o. Female with h/o fatty liver and  LDL  190-200 mg/dL,   Previous evaluation in the ER following chest pains, which were ultimately felt to be due to noncardiac chest pain, poorly controlled blood pressure,    Stress test negative.  lowered her LDL to 141 mg/dL in the past with high fiber / low fat diet, exercise, and 30 lbs weight loss.  She has regained all of this weight back   LDL back up to 191 mg/dL now.   She presents for evaluation of hyperlipidemia, aortic atherosclerosis  Changed morning routine, blood pressure better in the morning Pressure Was gardening first thing in the morning after drinking several cups of coffee Takes most of her blood pressure medications in the morning Pressure was running high in the morning   CT scan chest showed no coronary calcifications, minimal descending aorta calcification, none in the proximal carotid arteries. Images pulled up in the office and discussed with her Discussed recent CT chest  Previous appendicitis Had laparoscopic surgery In recovery developed pneumonia  Denies any chest pain concerning for angina No shortness of breath on exertion Denies any side effects from her medications She does yoga, no regular aerobic exercise   Cholesterol markedly elevated Was previously noncompliant with her Crestor  [place]   EKG personally reviewed by myself on todays visit Shows normal sinus rhythm with rate 79 bpm no significant ST or T-wave changes  Other past medical history Family history:  Mother (MI at 50 y.o. ; Diabetes at age 63 y.o.).  Father (TIA at 1 y.o.);  Brother (MI at 3 y.o.) Social History:  Drinks wine 4-6 glasses per week  Previously drank 4 nights per week.   She stopped 2009-2011 after seeing fatty liver results, but restarted in 2011 after stress levels increased.   No tobacco use.    Liver history:  Patient diagnosed with fatty liver on U/S 06/2007 by Dr. Gaye Pollack [facility], [place]).  Her LFTs went all the way to normal she tells me once she stopped drinking alcohol in 2009 - then went up after restating alcohol.  He checks LFTs annually.  ALT/AST were in the 90's in 03/06/13, then improved to 40's late 03/24/13 after she reduced her alcohol consumption from 4 days per week down to 2 nights per week (2-3 glasses per day).     Labs:  TC 274, TG 135, HDL 60, LDL191, Glucose 99, ALT 47, AST 34 (03/24/2013) -  PMH:   has a past medical history of Alcohol abuse, Blood transfusion without reported diagnosis, Cancer ([place]), Colon polyps, GERD (gastroesophageal reflux disease), H/O: depression, Hypercholesterolemia, Hypertension, Lung nodule, and Thyroid disease.  PSH:    Past Surgical History:  Procedure Laterality Date  . APPENDECTOMY  06/14/2017  . COLONOSCOPY    . CYST REMOVAL HAND Left   . DILATION AND CURETTAGE OF UTERUS    . LAPAROSCOPIC APPENDECTOMY N/A 06/14/2017   Procedure: APPENDECTOMY LAPAROSCOPIC;  Surgeon: Clayburn Pert, MD;  Location: [facility] ORS;  Service: General;  Laterality: N/A;    Current Outpatient Medications  Medication Sig Dispense Refill  . amLODipine (NORVASC) 10 MG tablet Take 1 tablet (10 mg total) by mouth daily.  30 tablet 5  . aspirin EC 81 MG tablet Take 81 mg by mouth 2 (two) times daily.    . cetirizine (ZYRTEC) 10 MG tablet Take 10 mg by mouth daily.    Marland Kitchen lisinopril (PRINIVIL,ZESTRIL) 40 MG tablet Take 1 tablet (40 mg total) by mouth daily. 30 tablet 5  . metoprolol succinate (TOPROL-XL) 100 MG 24 hr tablet Take 1 tablet (100 mg total) by mouth daily. Take with or immediately following a meal. 30 tablet 5  . omeprazole (PRILOSEC) 10 MG capsule Take 1 capsule (10 mg total) by mouth daily. 30 capsule 5  . Probiotic Product  (PROBIOTIC DAILY PO) Take 1 capsule by mouth as needed.     . rosuvastatin (CRESTOR) 5 MG tablet Take 1 tablet (5 mg total) by mouth at bedtime. 30 tablet 5  . Venlafaxine HCl 150 MG TB24 Take 1 tablet (150 mg total) by mouth daily. 30 each 5   No current facility-administered medications for this visit.      Allergies:   Patient has no known allergies.   Social History:  The patient  reports that she has never smoked. She has never used smokeless tobacco. She reports that she drinks about 14.0 standard drinks of alcohol per week. She reports that she does not use drugs.   Family History:   family history includes Diabetes in her mother; Heart disease in her brother; Heart failure in her mother; Hypertension in her father and mother; Stroke in her father and maternal grandmother.    Review of Systems: Review of Systems  Constitutional: Negative.   Respiratory: Negative.   Cardiovascular: Negative.   Gastrointestinal: Negative.   Musculoskeletal: Negative.   Neurological: Negative.   Psychiatric/Behavioral: Negative.   All other systems reviewed and are negative.    PHYSICAL EXAM: VS:  BP (!) 148/82 (BP Location: Left Arm, Patient Position: Sitting, Cuff Size: Normal)   Pulse 79   Ht 5' 4"  (1.626 m)   Wt 191 lb 8 oz (86.9 kg)   BMI 32.87 kg/m  , BMI Body mass index is 32.87 kg/m. Constitutional:  oriented to person, place, and time. No distress.  HENT:  Head: Normocephalic and atraumatic.  Eyes:  no discharge. No scleral icterus.  Neck: Normal range of motion. Neck supple. No JVD present.  Cardiovascular: Normal rate, regular rhythm, normal heart sounds and intact distal pulses. Exam reveals no gallop and no friction rub. No edema No murmur heard. Pulmonary/Chest: Effort normal and breath sounds normal. No stridor. No respiratory distress.  no wheezes.  no rales.  no tenderness.  Abdominal: Soft.  no distension.  no tenderness.  Musculoskeletal: Normal range of motion.  no   tenderness or deformity.  Neurological:  normal muscle tone. Coordination normal. No atrophy Skin: Skin is warm and dry. No rash noted. not diaphoretic.  Psychiatric:  normal mood and affect. behavior is normal. Thought content normal.       Recent Labs: 08/24/2017: ALT 36; BUN 11; Creatinine, Ser 0.51; Hemoglobin 14.0; Platelets 306.0; Potassium 4.4; Sodium 132    Lipid Panel Lab Results  Component Value Date   CHOL 250 (H) 08/24/2017   HDL 72.70 08/24/2017   LDLCALC 152 (H) 08/24/2017   TRIG 127.0 08/24/2017      Wt Readings from Last 3 Encounters:  01/21/18 191 lb 8 oz (86.9 kg)  10/29/17 190 lb (86.2 kg)  09/25/17 190 lb 12.8 oz (86.5 kg)       ASSESSMENT AND PLAN:  Essential hypertension -  Plan: EKG 12-Lead Recommend she take metoprolol in the evening  lisinopril amlodipine the morning If blood pressure continues to run high could potentially take half amlodipine a.m. And p.m. May need to add low-dose HCTZ every other day if blood pressure continues to run high  Hyperlipidemia, unspecified hyperlipidemia type - Plan: EKG 12-Lead Tolerating Zetia Recommend she stay compliant with her Crestor Discussed cholesterol numbers with her given very mild aortic atherosclerosis She will try to get her numbers down Order placed for repeat lab work  Aortic atherosclerosis ([place]) Minimal aortic atherosclerosis on prior CT scan Images pulled up and discussed with her in detail  Class 1 obesity due to excess calories without serious comorbidity with body mass index (BMI) of 30.0 to 30.9 in adult We have encouraged continued exercise, careful diet management in an effort to lose weight. He has already lost weight by changing her diet  Long discussion with her concerning her CT scan results, cholesterol goal  Total encounter time more than 45 minutes  Greater than 50% was spent in counseling and coordination of care with the patient  Disposition:   F/U  12  months   Orders Placed This Encounter  Procedures  . EKG 12-Lead     Signed, Esmond Plants, M.D., Ph.D. 01/21/2018  [place], [place]

## 2018-01-21 ENCOUNTER — Encounter: Payer: Self-pay | Admitting: Cardiovascular Disease

## 2018-01-21 ENCOUNTER — Ambulatory Visit (INDEPENDENT_AMBULATORY_CARE_PROVIDER_SITE_OTHER): Payer: PPO | Admitting: Cardiovascular Disease

## 2018-01-21 VITALS — BP 148/82 | HR 79 | Ht 64.0 in | Wt 191.5 lb

## 2018-01-21 DIAGNOSIS — I10 Essential (primary) hypertension: Secondary | ICD-10-CM

## 2018-01-21 DIAGNOSIS — Z6830 Body mass index (BMI) 30.0-30.9, adult: Secondary | ICD-10-CM

## 2018-01-21 DIAGNOSIS — E6609 Other obesity due to excess calories: Secondary | ICD-10-CM | POA: Diagnosis not present

## 2018-01-21 DIAGNOSIS — I700 Atherosclerosis of aorta: Secondary | ICD-10-CM

## 2018-01-21 DIAGNOSIS — E785 Hyperlipidemia, unspecified: Secondary | ICD-10-CM

## 2018-01-21 NOTE — Patient Instructions (Addendum)
Medication Instructions:   Metoprolol with dinner Monitor blood pressure  Walking  Ok to stop aspirin  Labwork:  Liver and lipids in one month  Testing/Procedures:  No further testing at this time   Follow-Up: It was a pleasure seeing you in the office today. Please call us if you have new issues that need to be addressed before your next appt.  [phone]  Your physician wants you to follow-up in:  As needed  If you need a refill on your cardiac medications before your next appointment, please call your pharmacy.  For educational health videos Log in to : [email or web address] Or : [email or web address], password : [email or web address]

## 2018-01-24 ENCOUNTER — Ambulatory Visit: Payer: PPO | Admitting: Cardiovascular Disease

## 2018-01-25 NOTE — Telephone Encounter (Signed)
Seen 8/19

## 2018-01-28 ENCOUNTER — Other Ambulatory Visit: Payer: Self-pay | Admitting: Family Medicine

## 2018-03-05 ENCOUNTER — Other Ambulatory Visit: Payer: Self-pay | Admitting: Family Medicine

## 2018-04-01 ENCOUNTER — Other Ambulatory Visit: Payer: Self-pay | Admitting: Family Medicine

## 2018-04-01 DIAGNOSIS — I10 Essential (primary) hypertension: Secondary | ICD-10-CM

## 2018-04-01 DIAGNOSIS — K2100 Gastro-esophageal reflux disease with esophagitis, without bleeding: Secondary | ICD-10-CM

## 2018-05-12 ENCOUNTER — Other Ambulatory Visit: Payer: Self-pay | Admitting: Family Medicine

## 2018-05-31 ENCOUNTER — Other Ambulatory Visit: Payer: Self-pay

## 2018-05-31 ENCOUNTER — Other Ambulatory Visit: Payer: Self-pay | Admitting: Cardiovascular Disease

## 2018-05-31 DIAGNOSIS — K2100 Gastro-esophageal reflux disease with esophagitis, without bleeding: Secondary | ICD-10-CM

## 2018-05-31 DIAGNOSIS — E785 Hyperlipidemia, unspecified: Secondary | ICD-10-CM

## 2018-05-31 DIAGNOSIS — I10 Essential (primary) hypertension: Secondary | ICD-10-CM

## 2018-05-31 MED ORDER — AMLODIPINE BESYLATE 10 MG PO TABS: 10.0000 mg | ORAL_TABLET | Freq: Every day | ORAL | 0 refills | Status: DC

## 2018-05-31 MED ORDER — ROSUVASTATIN CALCIUM 5 MG PO TABS: 5.0000 mg | ORAL_TABLET | Freq: Every day | ORAL | 0 refills | Status: DC

## 2018-05-31 NOTE — Telephone Encounter (Signed)
°*  STAT* If patient is at the pharmacy, call can be transferred to refill team.   1. Which medications need to be refilled? (please list name of each medication and dose if known)  Amlodipine 10 mg po q d ; rosuvastatin 5 mg po  q d   2. Which pharmacy/location (including street and city if local pharmacy) is medication to be sent to? [facility] [address] [place]   3. Do they need a 30 day or 90 day supply? [place]

## 2018-05-31 NOTE — Telephone Encounter (Signed)
Requested Prescriptions   Signed Prescriptions Disp Refills  . amLODipine (NORVASC) 10 MG tablet 90 tablet 0    Sig: Take 1 tablet (10 mg total) by mouth daily.    Authorizing Provider: Minna Merritts    Ordering User: Janan Ridge rosuvastatin (CRESTOR) 5 MG tablet 90 tablet 0    Sig: Take 1 tablet (5 mg total) by mouth at bedtime.    Authorizing Provider: Minna Merritts    Ordering User: Janan Ridge

## 2018-06-02 ENCOUNTER — Other Ambulatory Visit: Payer: Self-pay | Admitting: Family Medicine

## 2018-06-02 DIAGNOSIS — I10 Essential (primary) hypertension: Secondary | ICD-10-CM

## 2018-06-04 ENCOUNTER — Encounter: Payer: Self-pay | Admitting: Family Medicine

## 2018-06-04 ENCOUNTER — Ambulatory Visit (INDEPENDENT_AMBULATORY_CARE_PROVIDER_SITE_OTHER): Payer: PPO | Admitting: Family Medicine

## 2018-06-04 VITALS — BP 138/72 | HR 98 | Temp 101.1°F | Ht 64.0 in | Wt 192.4 lb

## 2018-06-04 DIAGNOSIS — J101 Influenza due to other identified influenza virus with other respiratory manifestations: Secondary | ICD-10-CM | POA: Diagnosis not present

## 2018-06-04 DIAGNOSIS — R509 Fever, unspecified: Secondary | ICD-10-CM

## 2018-06-04 LAB — POC INFLUENZA A&B (BINAX/QUICKVUE)
INFLUENZA A, POC: POSITIVE — AB
INFLUENZA B, POC: NEGATIVE

## 2018-06-04 MED ORDER — BENZONATATE 200 MG PO CAPS: 200.0000 mg | ORAL_CAPSULE | Freq: Two times a day (BID) | ORAL | 0 refills | Status: DC | PRN

## 2018-06-04 MED ORDER — PROMETHAZINE-DM 6.25-15 MG/5ML PO SYRP: 5.0000 mL | ORAL_SOLUTION | Freq: Four times a day (QID) | ORAL | 0 refills | Status: DC | PRN

## 2018-06-04 MED ORDER — DOXYCYCLINE HYCLATE 100 MG PO TABS: 100.0000 mg | ORAL_TABLET | Freq: Two times a day (BID) | ORAL | 0 refills | Status: DC

## 2018-06-04 NOTE — Patient Instructions (Signed)

## 2018-06-04 NOTE — Progress Notes (Signed)
SUBJECTIVE:  Christy Freeman is a 67 y.o. female who present complaining of flu-like symptoms: fevers, chills, myalgias, congestion, sore throat and cough for 4 days.  Tmax 102.  Cough has become more productive- waking up in the middle of the night with severe coughing spells.  Not sleeping well.  Current Outpatient Medications on File Prior to Visit  Medication Sig Dispense Refill  . amLODipine (NORVASC) 10 MG tablet Take 1 tablet (10 mg total) by mouth daily. 90 tablet 0  . cetirizine (ZYRTEC) 10 MG tablet Take 10 mg by mouth daily.    Marland Kitchen lisinopril (PRINIVIL,ZESTRIL) 40 MG tablet TAKE 1 TABLET BY MOUTH ONCE DAILY 30 tablet 5  . metoprolol succinate (TOPROL-XL) 100 MG 24 hr tablet Take 1 tablet (100 mg total) by mouth daily. Take with or immediately following a meal. 30 tablet 5  . omeprazole (PRILOSEC) 10 MG capsule TAKE 1 CAPSULE BY MOUTH ONCE DAILY 90 capsule 1  . Probiotic Product (PROBIOTIC DAILY PO) Take 1 capsule by mouth as needed.     . rosuvastatin (CRESTOR) 5 MG tablet Take 1 tablet (5 mg total) by mouth at bedtime. 90 tablet 0  . venlafaxine XR (EFFEXOR-XR) 150 MG 24 hr capsule TAKE 1 CAPSULE BY MOUTH ONCE DAILY 30 capsule 5   No current facility-administered medications on file prior to visit.     No Known Allergies  Past Medical History:  Diagnosis Date  . Alcohol abuse   . Blood transfusion without reported diagnosis   . Cancer ([facility])    basil cell on face  . Colon polyps   . GERD (gastroesophageal reflux disease)   . H/O: depression   . Hypercholesterolemia    diet controlled  . Hypertension   . Lung nodule    left lung   . Thyroid disease     Past Surgical History:  Procedure Laterality Date  . APPENDECTOMY  06/14/2017  . COLONOSCOPY    . CYST REMOVAL HAND Left   . DILATION AND CURETTAGE OF UTERUS    . LAPAROSCOPIC APPENDECTOMY N/A 06/14/2017   Procedure: APPENDECTOMY LAPAROSCOPIC;  Surgeon: Clayburn Pert, MD;  Location: [facility] ORS;  Service: General;   Laterality: N/A;    Family History  Problem Relation Age of Onset  . Diabetes Mother   . Heart failure Mother   . Hypertension Mother   . Stroke Father   . Hypertension Father   . Heart disease Brother   . Stroke Maternal Grandmother   . Colon cancer Neg Hx     Social History   Socioeconomic History  . Marital status: Married    Spouse name: Not on file  . Number of children: Not on file  . Years of education: Not on file  . Highest education level: Not on file  Occupational History  . Not on file  Social Needs  . Financial resource strain: Not on file  . Food insecurity:    Worry: Not on file    Inability: Not on file  . Transportation needs:    Medical: Not on file    Non-medical: Not on file  Tobacco Use  . Smoking status: Never Smoker  . Smokeless tobacco: Never Used  Substance and Sexual Activity  . Alcohol use: Yes    Alcohol/week: 14.0 standard drinks    Types: 6 Glasses of wine, 8 Standard drinks or equivalent per week    Comment: 2 glasses of wine per night  . Drug use: No  . Sexual activity: Yes  Lifestyle  . Physical activity:    Days per week: Not on file    Minutes per session: Not on file  . Stress: Not on file  Relationships  . Social connections:    Talks on phone: Not on file    Gets together: Not on file    Attends religious service: Not on file    Active member of club or organization: Not on file    Attends meetings of clubs or organizations: Not on file    Relationship status: Not on file  . Intimate partner violence:    Fear of current or ex partner: Not on file    Emotionally abused: Not on file    Physically abused: Not on file    Forced sexual activity: Not on file  Other Topics Concern  . Not on file  Social History Narrative   She would desire CPR.   Does have a living will.   The PMH, PSH, Social History, Family History, Medications, and allergies have been reviewed in [facility], and have been updated if  relevant.  OBJECTIVE: BP 138/72 (BP Location: Left Arm, Patient Position: Sitting, Cuff Size: Normal)   Pulse 98   Temp (!) 101.1 F (38.4 C) (Oral)   Ht 5' 4"  (1.626 m)   Wt 192 lb 6.4 oz (87.3 kg)   SpO2 96%   BMI 33.03 kg/m   Appears moderately ill but not toxic; temperature as noted in vitals. Ears normal. Throat and pharynx normal.  Neck supple. No adenopathy in the neck. Sinuses non tender. Harsh wet cough with scattered wheezes and rhonchi on exam  ASSESSMENT: Influenza  PLAN: Outside window for tamiflu, now with symptoms of post flu pneumonia/bronchitis. Treat with doxycyline 100 mg twice daily. X 7 days. Symptomatic therapy suggested: rest, increase fluids and call prn if symptoms persist or worsen.  Tessalon as needed for cough during the day, promethazine as needed for more severe cough if not driving or before bed. Call or return to clinic prn if these symptoms worsen or fail to improve as anticipated.

## 2018-06-10 ENCOUNTER — Telehealth: Payer: Self-pay | Admitting: Family Medicine

## 2018-06-10 NOTE — Telephone Encounter (Signed)
Copied from [place] [id number]. Topic: Quick Communication - See Telephone Encounter >> Jun 10, 2018  9:13 AM Lionel December wrote: CRM for notification. See Telephone encounter for: 06/10/18. Pt was seen last week and is on her last dosage of  doxycycline (VIBRA-TABS) 100 MG tablet. She is feeling a little better but wanted to know if she needed to come back in. Would like a call back from the nurse.

## 2018-06-12 ENCOUNTER — Ambulatory Visit (INDEPENDENT_AMBULATORY_CARE_PROVIDER_SITE_OTHER): Payer: PPO | Admitting: Family Medicine

## 2018-06-12 ENCOUNTER — Encounter: Payer: Self-pay | Admitting: Family Medicine

## 2018-06-12 VITALS — BP 138/86 | HR 72 | Temp 98.9°F | Ht 64.0 in | Wt 190.6 lb

## 2018-06-12 DIAGNOSIS — J09X1 Influenza due to identified novel influenza A virus with pneumonia: Secondary | ICD-10-CM

## 2018-06-12 MED ORDER — BENZONATATE 100 MG PO CAPS: ORAL_CAPSULE | ORAL | 0 refills | Status: DC

## 2018-06-12 MED ORDER — AZITHROMYCIN 250 MG PO TABS: ORAL_TABLET | ORAL | 0 refills | Status: DC

## 2018-06-12 NOTE — Telephone Encounter (Signed)
Patient was seen in office today.  

## 2018-06-12 NOTE — Assessment & Plan Note (Signed)
Complicated course. Lung exam improving but she does have persistent congestion and wheezing. Treat with zpack. Continue tessalon and as promethazine DM as needed for cough at bedtime.  Call or return to clinic prn if these symptoms worsen or fail to improve as anticipated. The patient indicates understanding of these issues and agrees with the plan.

## 2018-06-12 NOTE — Patient Instructions (Addendum)
Great to see you.  Take zpack as directed. Continue tessalon and promethazine DM as needed for cough.  Happy birthday!!!

## 2018-06-12 NOTE — Progress Notes (Signed)
Subjective:   Patient ID: Christy Freeman, female    DOB: 07-02-1950, 68 y.o.   MRN: [id number]  Christy Freeman is a pleasant 68 y.o. year old female who presents to clinic today with Cough (Patient is here today for a possible post-flu infection.  She was Dx with flu A on 12.31.19 and Tx.  Today she comes in with a productive cough with clear sputum and clear nasal mucous.  She is losing her voice. Feels like she has to blow her nose all the time but denies any sinus pressure or ear pain.  She is fearful of chest inf as has had pneumonia and bronchitis in the past.)  on 06/12/2018  HPI:  Last saw patient on 06/04/18. Note reviewed.  At that time, she presented with 4 days of flu like symptoms- including fevers, chills, myalgias and cough.  Tmax was 102. Some symptoms had improved, such as myalgias but her cough was become worse and more productive.  Was not sleeping well at night.  In the office on 06/04/18- she was febrile with a temp of 101.1.  O2 sats were 96 %.   She did have a very harsh and wet cough with scattered wheezes and rhonchi on exam.  Rapid flu swab was positive for Influenza A.  Since she was outside the window for tamiflu and her symptoms and exam on 06/04/09 were more consistent post flu pneumonia/bronchitis, I treated her with an antibiotic- doxycyline 100 mg twice daily x 7 days.  eRxs also sent in for her for tessalon perles to but used as needed for cough during the day and promethazine DM as needed for severe cough when she was not driving or operating heavy machinery.  She feels both tessalon and promethazine have helped.  She is here today because she finished her last dose of doxycyline two days ago.  Feeling a little better but wanted to make sure she was getting better as she is still having a a lot of nasal and chest congestion.  No longer febrile. Cough remains productive but not as often and the sputum is clear.  Cough is occasional.   Current Outpatient Medications  on File Prior to Visit  Medication Sig Dispense Refill  . amLODipine (NORVASC) 10 MG tablet Take 1 tablet (10 mg total) by mouth daily. 90 tablet 0  . cetirizine (ZYRTEC) 10 MG tablet Take 10 mg by mouth daily.    Marland Kitchen lisinopril (PRINIVIL,ZESTRIL) 40 MG tablet TAKE 1 TABLET BY MOUTH ONCE DAILY 30 tablet 5  . metoprolol succinate (TOPROL-XL) 100 MG 24 hr tablet Take 1 tablet (100 mg total) by mouth daily. Take with or immediately following a meal. 30 tablet 5  . omeprazole (PRILOSEC) 10 MG capsule TAKE 1 CAPSULE BY MOUTH ONCE DAILY 90 capsule 1  . Probiotic Product (PROBIOTIC DAILY PO) Take 1 capsule by mouth as needed.     . promethazine-dextromethorphan (PROMETHAZINE-DM) 6.25-15 MG/5ML syrup Take 5 mLs by mouth 4 (four) times daily as needed. 180 mL 0  . rosuvastatin (CRESTOR) 5 MG tablet Take 1 tablet (5 mg total) by mouth at bedtime. 90 tablet 0  . venlafaxine XR (EFFEXOR-XR) 150 MG 24 hr capsule TAKE 1 CAPSULE BY MOUTH ONCE DAILY 30 capsule 5   No current facility-administered medications on file prior to visit.     No Known Allergies  Past Medical History:  Diagnosis Date  . Alcohol abuse   . Blood transfusion without reported diagnosis   . Cancer ([place])  basil cell on face  . Colon polyps   . GERD (gastroesophageal reflux disease)   . H/O: depression   . Hypercholesterolemia    diet controlled  . Hypertension   . Lung nodule    left lung   . Thyroid disease     Past Surgical History:  Procedure Laterality Date  . APPENDECTOMY  06/14/2017  . COLONOSCOPY    . CYST REMOVAL HAND Left   . DILATION AND CURETTAGE OF UTERUS    . LAPAROSCOPIC APPENDECTOMY N/A 06/14/2017   Procedure: APPENDECTOMY LAPAROSCOPIC;  Surgeon: Clayburn Pert, MD;  Location: [facility] ORS;  Service: General;  Laterality: N/A;    Family History  Problem Relation Age of Onset  . Diabetes Mother   . Heart failure Mother   . Hypertension Mother   . Stroke Father   . Hypertension Father   . Heart disease  Brother   . Stroke Maternal Grandmother   . Colon cancer Neg Hx     Social History   Socioeconomic History  . Marital status: Married    Spouse name: Not on file  . Number of children: Not on file  . Years of education: Not on file  . Highest education level: Not on file  Occupational History  . Not on file  Social Needs  . Financial resource strain: Not on file  . Food insecurity:    Worry: Not on file    Inability: Not on file  . Transportation needs:    Medical: Not on file    Non-medical: Not on file  Tobacco Use  . Smoking status: Never Smoker  . Smokeless tobacco: Never Used  Substance and Sexual Activity  . Alcohol use: Yes    Alcohol/week: 14.0 standard drinks    Types: 6 Glasses of wine, 8 Standard drinks or equivalent per week    Comment: 2 glasses of wine per night  . Drug use: No  . Sexual activity: Yes  Lifestyle  . Physical activity:    Days per week: Not on file    Minutes per session: Not on file  . Stress: Not on file  Relationships  . Social connections:    Talks on phone: Not on file    Gets together: Not on file    Attends religious service: Not on file    Active member of club or organization: Not on file    Attends meetings of clubs or organizations: Not on file    Relationship status: Not on file  . Intimate partner violence:    Fear of current or ex partner: Not on file    Emotionally abused: Not on file    Physically abused: Not on file    Forced sexual activity: Not on file  Other Topics Concern  . Not on file  Social History Narrative   She would desire CPR.   Does have a living will.                           The PMH, PSH, Social History, Family History, Medications, and allergies have been reviewed in [facility], and have been updated if relevant.  Review of Systems  Constitutional: Negative.   HENT: Positive for congestion and sinus pressure. Negative for sneezing and sore throat.   Respiratory: Positive for cough. Negative for  apnea, choking, chest tightness, shortness of breath, wheezing and stridor.   Cardiovascular: Negative.   Gastrointestinal: Negative.   Endocrine: Negative.   Genitourinary:  Negative.   Musculoskeletal: Negative.   Skin: Negative.   Allergic/Immunologic: Negative.   Neurological: Negative.   Hematological: Negative.   Psychiatric/Behavioral: Negative.   All other systems reviewed and are negative.      Objective:    BP 138/86 (BP Location: Left Arm, Patient Position: Sitting, Cuff Size: Normal)   Pulse 72   Temp 98.9 F (37.2 C) (Oral)   Ht 5' 4"  (1.626 m)   Wt 190 lb 9.6 oz (86.5 kg)   SpO2 95%   BMI 32.72 kg/m    Physical Exam Vitals signs and nursing note reviewed.  Constitutional:      Appearance: Normal appearance.  HENT:     Right Ear: Tympanic membrane normal.     Left Ear: Tympanic membrane normal.     Nose: Nose normal.     Mouth/Throat:     Mouth: Mucous membranes are moist.  Eyes:     Pupils: Pupils are equal, round, and reactive to light.  Neck:     Musculoskeletal: Normal range of motion.  Cardiovascular:     Rate and Rhythm: Normal rate and regular rhythm.     Pulses: Normal pulses.     Heart sounds: Normal heart sounds.  Pulmonary:     Effort: Pulmonary effort is normal.     Breath sounds: Wheezing present. No rhonchi.  Abdominal:     General: Abdomen is flat. Bowel sounds are normal.     Palpations: Abdomen is soft.  Musculoskeletal: Normal range of motion.  Skin:    General: Skin is warm and dry.  Neurological:     General: No focal deficit present.     Mental Status: She is alert. Mental status is at baseline.  Psychiatric:        Mood and Affect: Mood normal.        Thought Content: Thought content normal.           Assessment & Plan:   Influenza A with pneumonia No follow-ups on file.

## 2018-06-18 ENCOUNTER — Other Ambulatory Visit: Payer: Self-pay | Admitting: Family Medicine

## 2018-06-20 ENCOUNTER — Other Ambulatory Visit: Payer: Self-pay | Admitting: Family Medicine

## 2018-06-23 ENCOUNTER — Encounter: Payer: Self-pay | Admitting: Family Medicine

## 2018-06-24 DIAGNOSIS — D1800 Hemangioma unspecified site: Secondary | ICD-10-CM | POA: Diagnosis not present

## 2018-06-24 DIAGNOSIS — L570 Actinic keratosis: Secondary | ICD-10-CM | POA: Diagnosis not present

## 2018-06-25 ENCOUNTER — Other Ambulatory Visit: Payer: Self-pay | Admitting: Family Medicine

## 2018-06-25 MED ORDER — PROMETHAZINE-DM 6.25-15 MG/5ML PO SYRP: 5.0000 mL | ORAL_SOLUTION | Freq: Four times a day (QID) | ORAL | 0 refills | Status: DC | PRN

## 2018-06-27 DIAGNOSIS — H2513 Age-related nuclear cataract, bilateral: Secondary | ICD-10-CM | POA: Diagnosis not present

## 2018-07-05 IMAGING — CT CT ABD-PELV W/ CM
2 of 5 series · 15 of 46 positions shown, 17 images · IV contrast (APPLIED)
Comparison: None.

CLINICAL DATA: Right lower quadrant pain for 2 days

EXAM:
CT ABDOMEN AND PELVIS WITH CONTRAST
TECHNIQUE: Multidetector CT imaging of the abdomen and pelvis was performed
using the standard protocol following bolus administration of
intravenous contrast.
CONTRAST:  100mL LCZ8RL-JDD IOPAMIDOL (LCZ8RL-JDD) INJECTION 61%

[Series 2: axial st · axial · 0.74mm/px · z∈[-880,-465]mm · 12 of 93 slices shown, 14 images]
[im 5/93  soft-tissue]
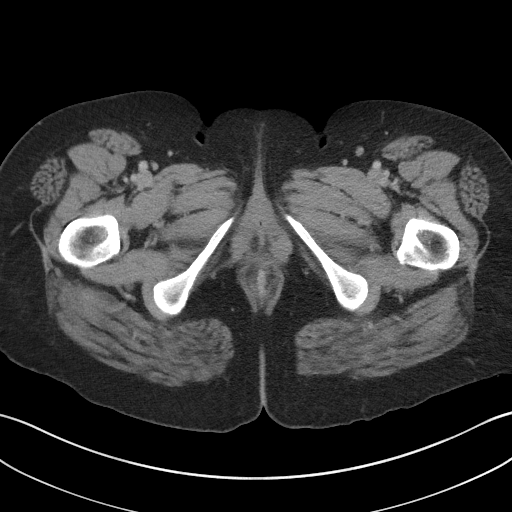
[im 5/93  bone]
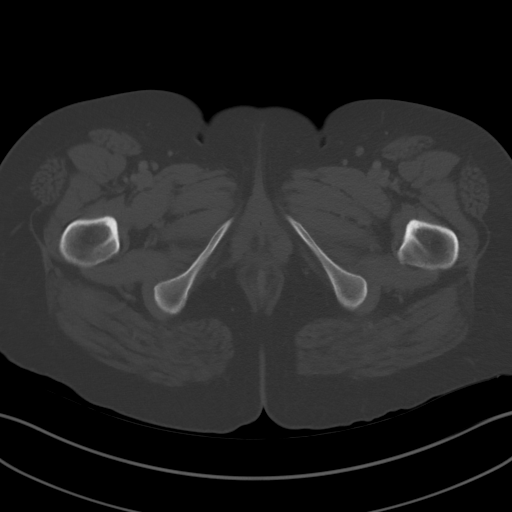
[im 15/93  soft-tissue]
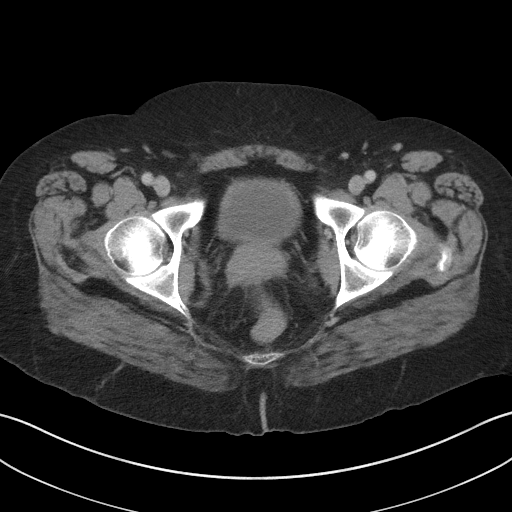
[im 20/93  soft-tissue]
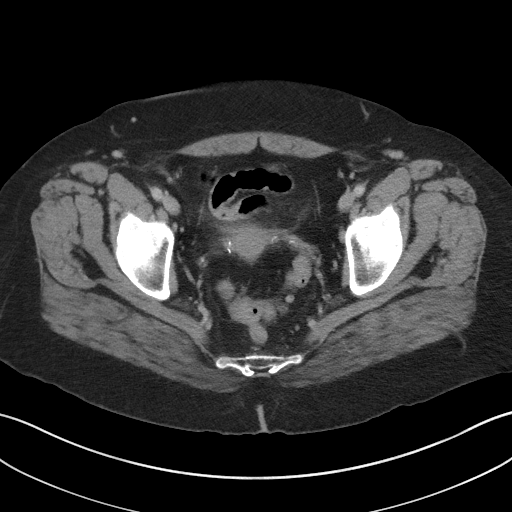
[im 30/93  soft-tissue]
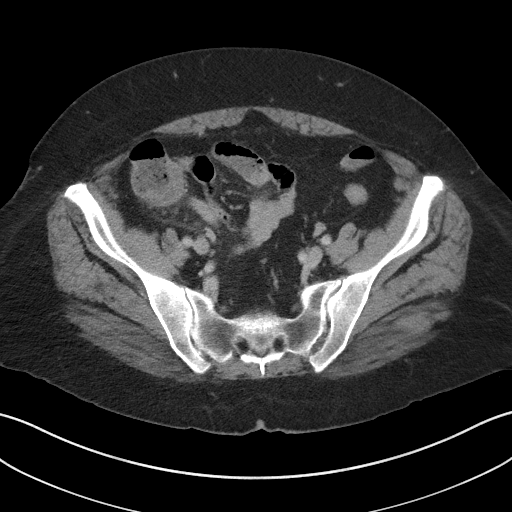
[im 34/93  soft-tissue]
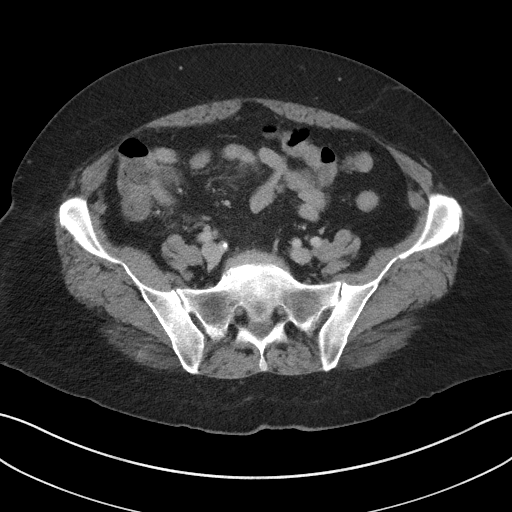
[im 44/93  soft-tissue]
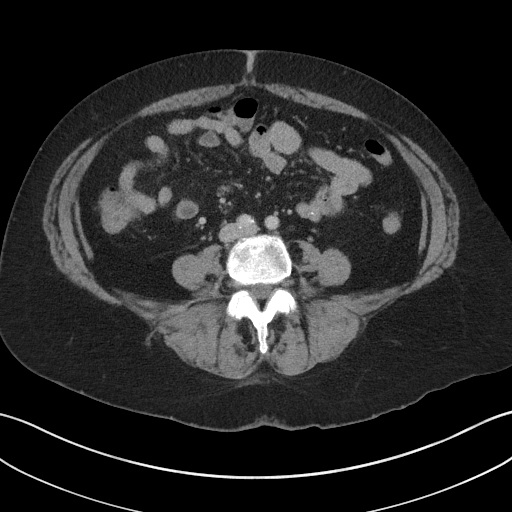
[im 49/93  soft-tissue]
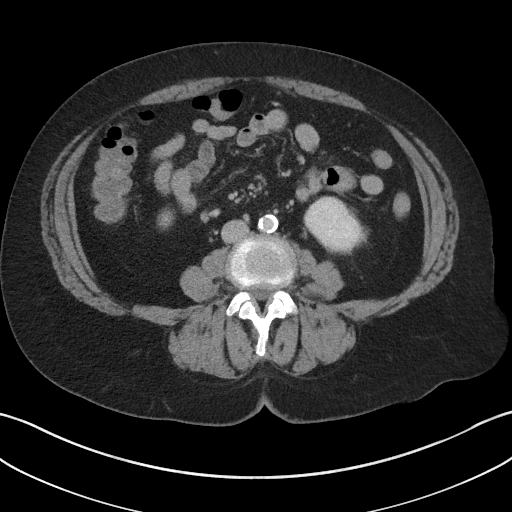
[im 59/93  soft-tissue]
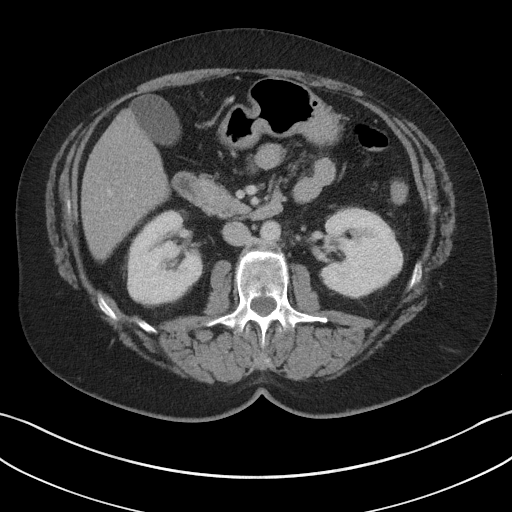
[im 63/93  soft-tissue]
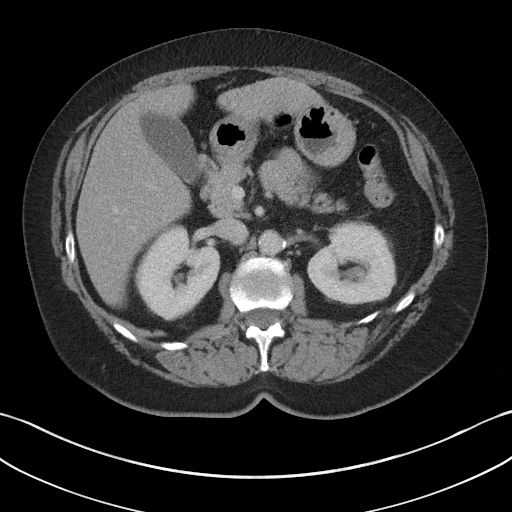
[im 63/93  bone]
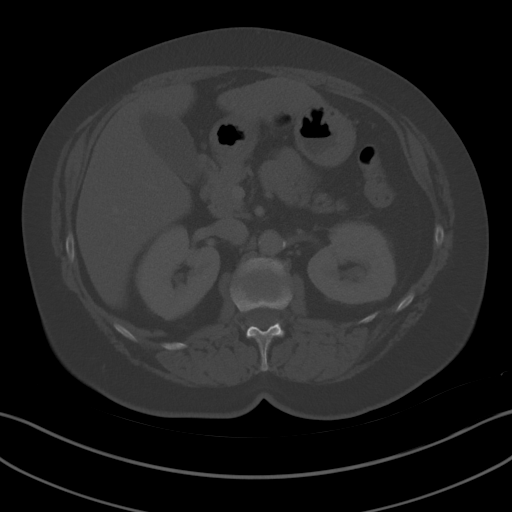
[im 73/93  soft-tissue]
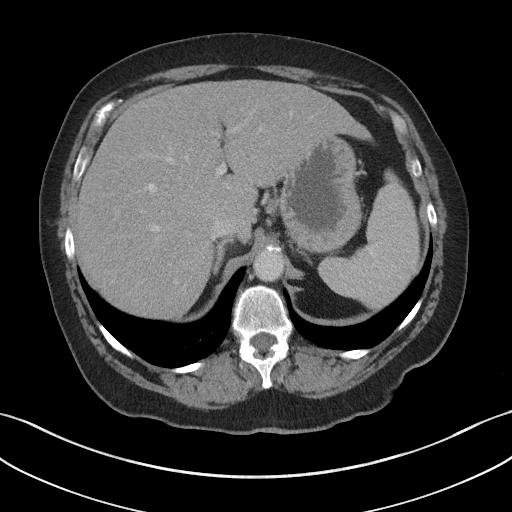
[im 78/93  soft-tissue]
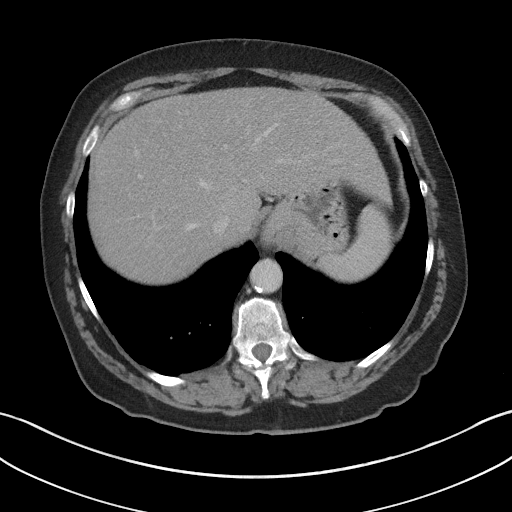
[im 88/93  soft-tissue]
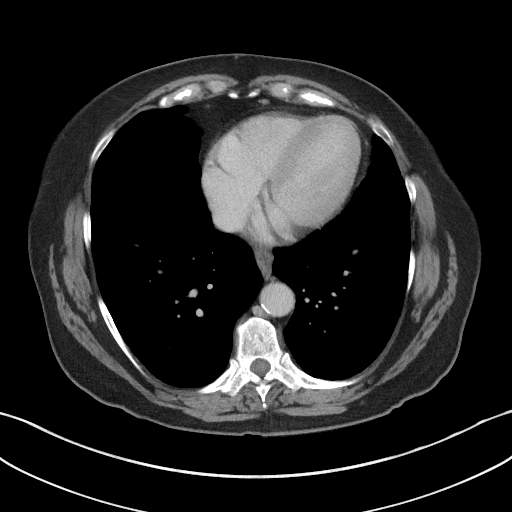

[Series 5: coronal st · coronal · 0.62mm/px · 3 of 92 slices shown]
[im 31/92  soft-tissue]
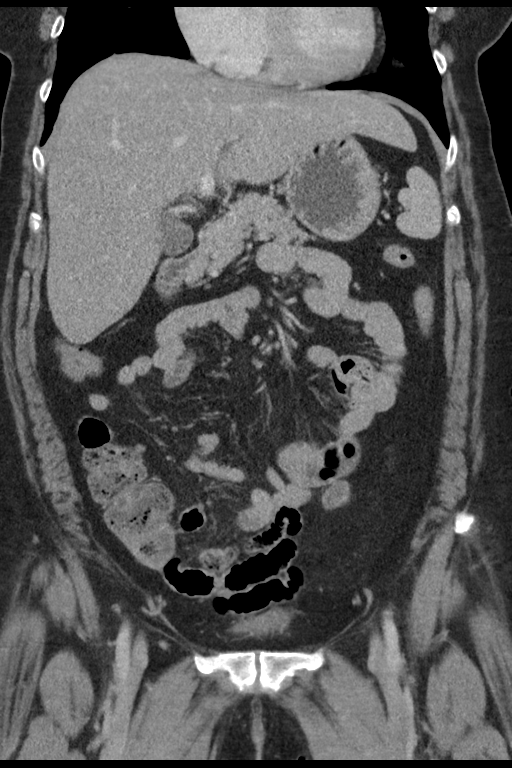
[im 41/92  soft-tissue]
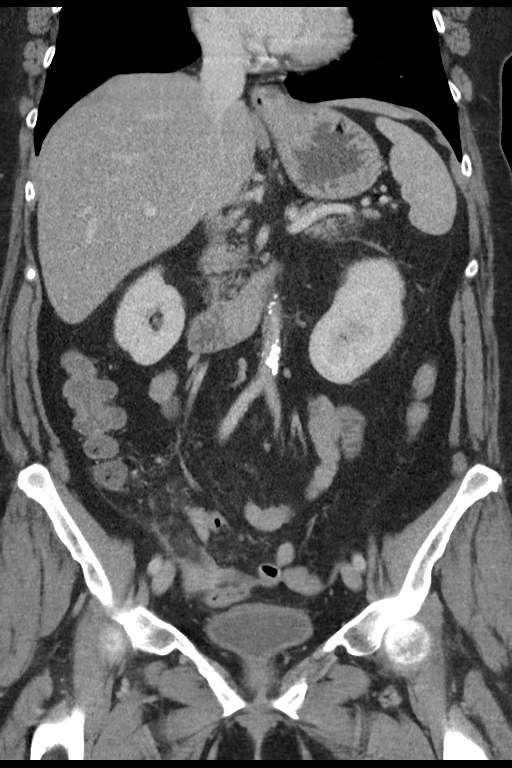
[im 51/92  soft-tissue]
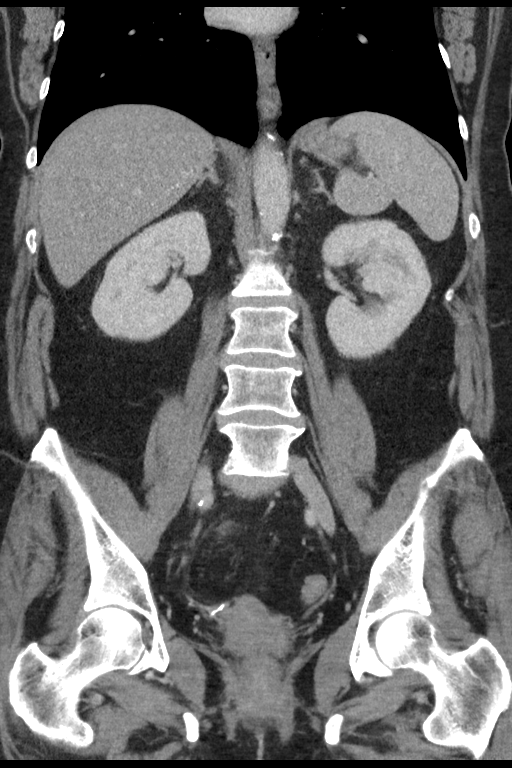

[15 of 46 positions shown; findings below may reference images not displayed]

FINDINGS: Lower chest: No acute abnormality.

Hepatobiliary: No focal liver abnormality is seen. No gallstones,
gallbladder wall thickening, or biliary dilatation.

Pancreas: Unremarkable. No pancreatic ductal dilatation or
surrounding inflammatory changes.

Spleen: Normal in size without focal abnormality.

Adrenals/Urinary Tract: Adrenal glands are unremarkable. Kidneys are
normal, without renal calculi, focal lesion, or hydronephrosis.
Bladder is unremarkable.

Stomach/Bowel: The appendix is dilated to 11 mm with a central
appendicolith. The appendicolith measures approximately 9 mm in
greatest dimension. Mild periappendiceal inflammatory changes are
noted consistent with acute appendicitis. Scattered diverticular
changes noted without evidence of diverticulitis.

Vascular/Lymphatic: Aortic atherosclerosis. No enlarged abdominal or
pelvic lymph nodes.

Reproductive: Uterus and bilateral adnexa are unremarkable.

Other: No abdominal wall hernia or abnormality. No abdominopelvic
ascites.

Musculoskeletal: Mild degenerative changes of the lumbar spine are
noted.
IMPRESSION: Changes consistent with acute appendicitis with central
appendicolith and mild periappendiceal inflammatory change. No
definitive abscess is noted.

## 2018-07-12 ENCOUNTER — Other Ambulatory Visit: Payer: Self-pay

## 2018-07-12 NOTE — Patient Outreach (Signed)
[facility]) Care Management  07/12/2018  Christy Freeman [birth year] [id number]   Medication Adherence call  To Christy Freeman HIPPA Compliant Voice message left with a call back number.   [place] Management Direct Dial [phone]  Fax [phone] [name]

## 2018-07-23 ENCOUNTER — Ambulatory Visit: Payer: PPO | Admitting: Family Medicine

## 2018-07-23 ENCOUNTER — Other Ambulatory Visit: Payer: Self-pay

## 2018-07-23 NOTE — Patient Outreach (Signed)
[facility]) Care Management  07/23/2018  Christy Freeman 12-30-1950 [id number]   Medication Adherence call to Mr. Christy Freeman HIPPA Compliant Voice message left with a call back number. Telephone call to Patient regarding Medication Adherence unable to reach patient   [place] Management Direct Dial [phone]  Fax [phone] [email or web address]

## 2018-07-29 ENCOUNTER — Other Ambulatory Visit: Payer: Self-pay

## 2018-07-29 NOTE — Patient Outreach (Signed)
[facility]) Care Management  07/29/2018  Inga Noller 09-13-1950 [id number]    Medication Adherence call to Mrs. Maryn Freelove Telephone call to Patient regarding Medication Adherence unable to reach patient. [facility] said last pick up on Rosuvastatin 5 mg was on 05/31/18 for a 3 month supply patient is not due until March/2020.   [place] Management Direct Dial [phone]  Fax [phone] [email or web address]

## 2018-07-31 ENCOUNTER — Other Ambulatory Visit: Payer: Self-pay | Admitting: Family Medicine

## 2018-07-31 DIAGNOSIS — Z1231 Encounter for screening mammogram for malignant neoplasm of breast: Secondary | ICD-10-CM

## 2018-08-03 IMAGING — DX DG CHEST 2V
2 series · 2 of 2 positions shown · non-contrast
Comparison: Chest x-ray 10/13/2013, CT chest 12/15/2013, 01/28/2015

CLINICAL DATA: 67-year-old female with a history of cough

EXAM:
CHEST  2 VIEW

[chest pa]
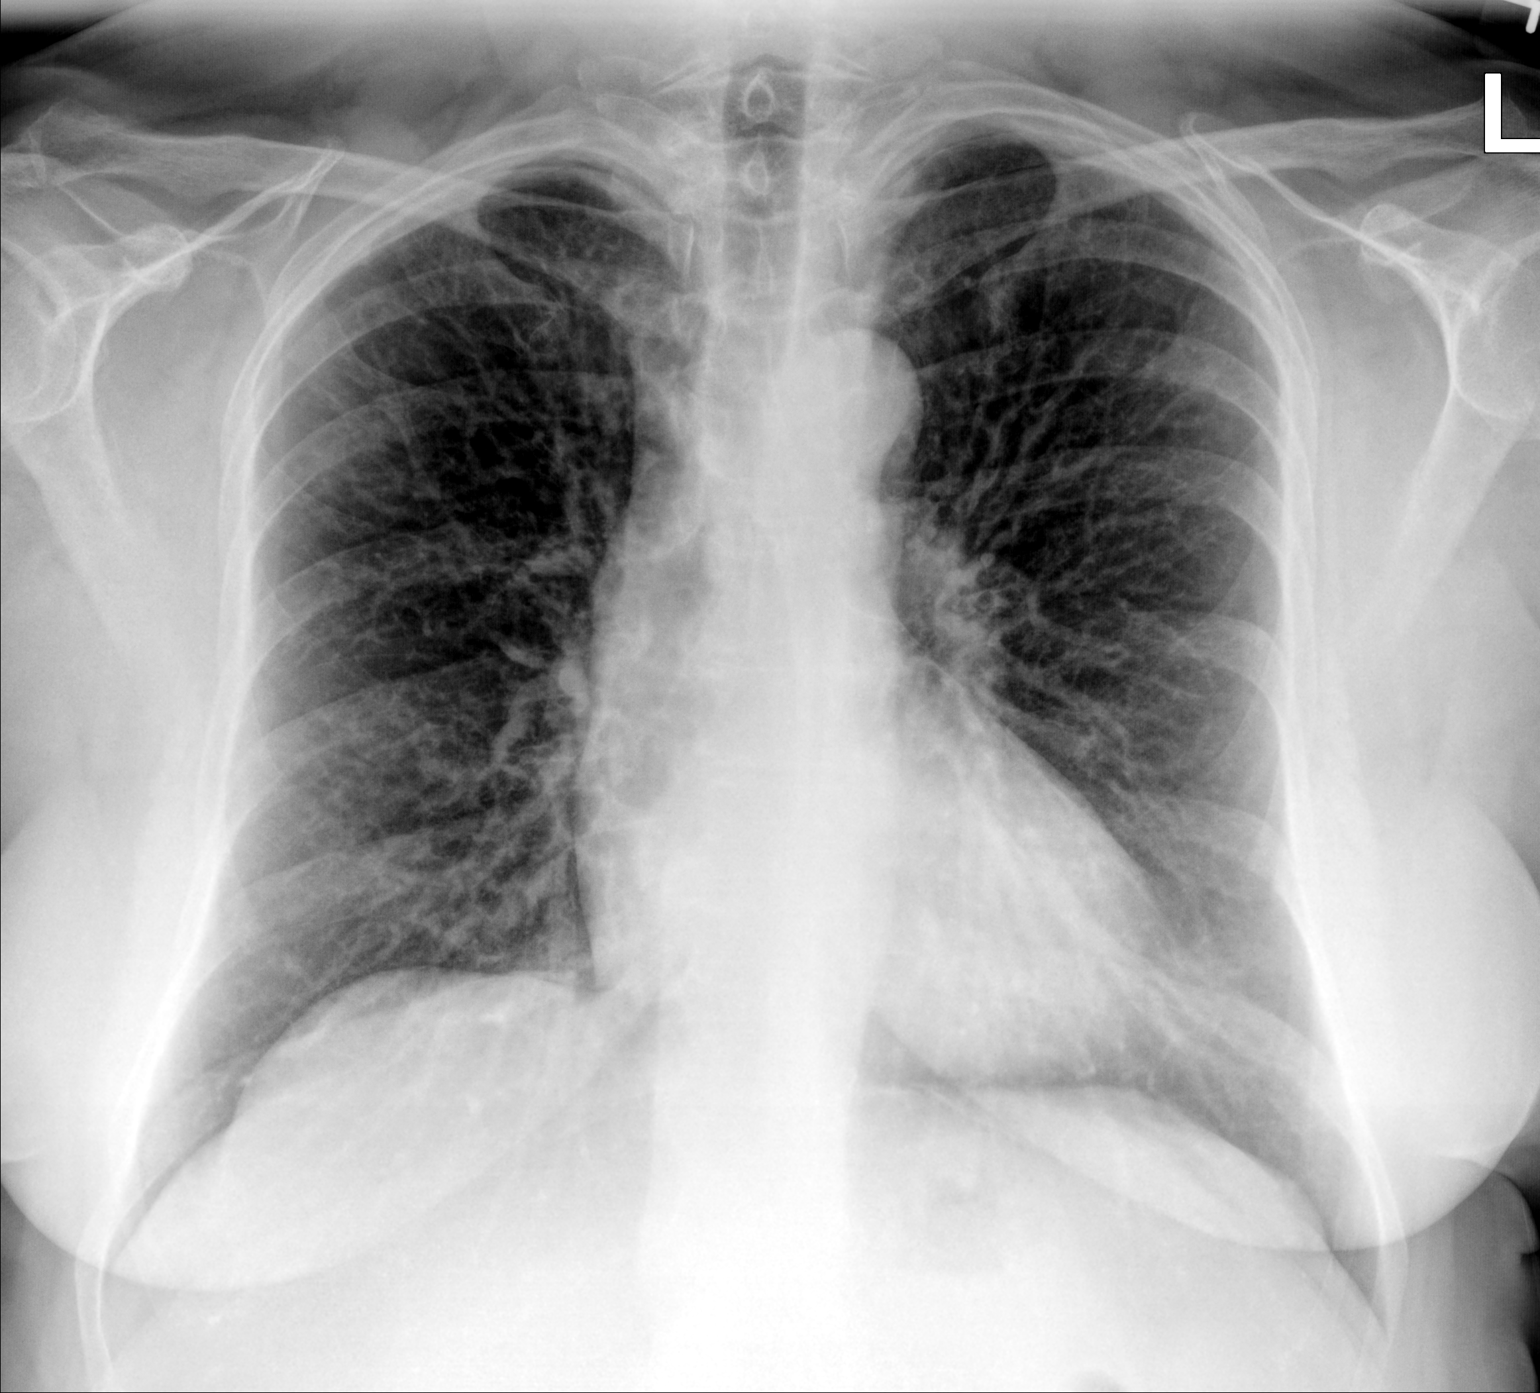

[chest lat]
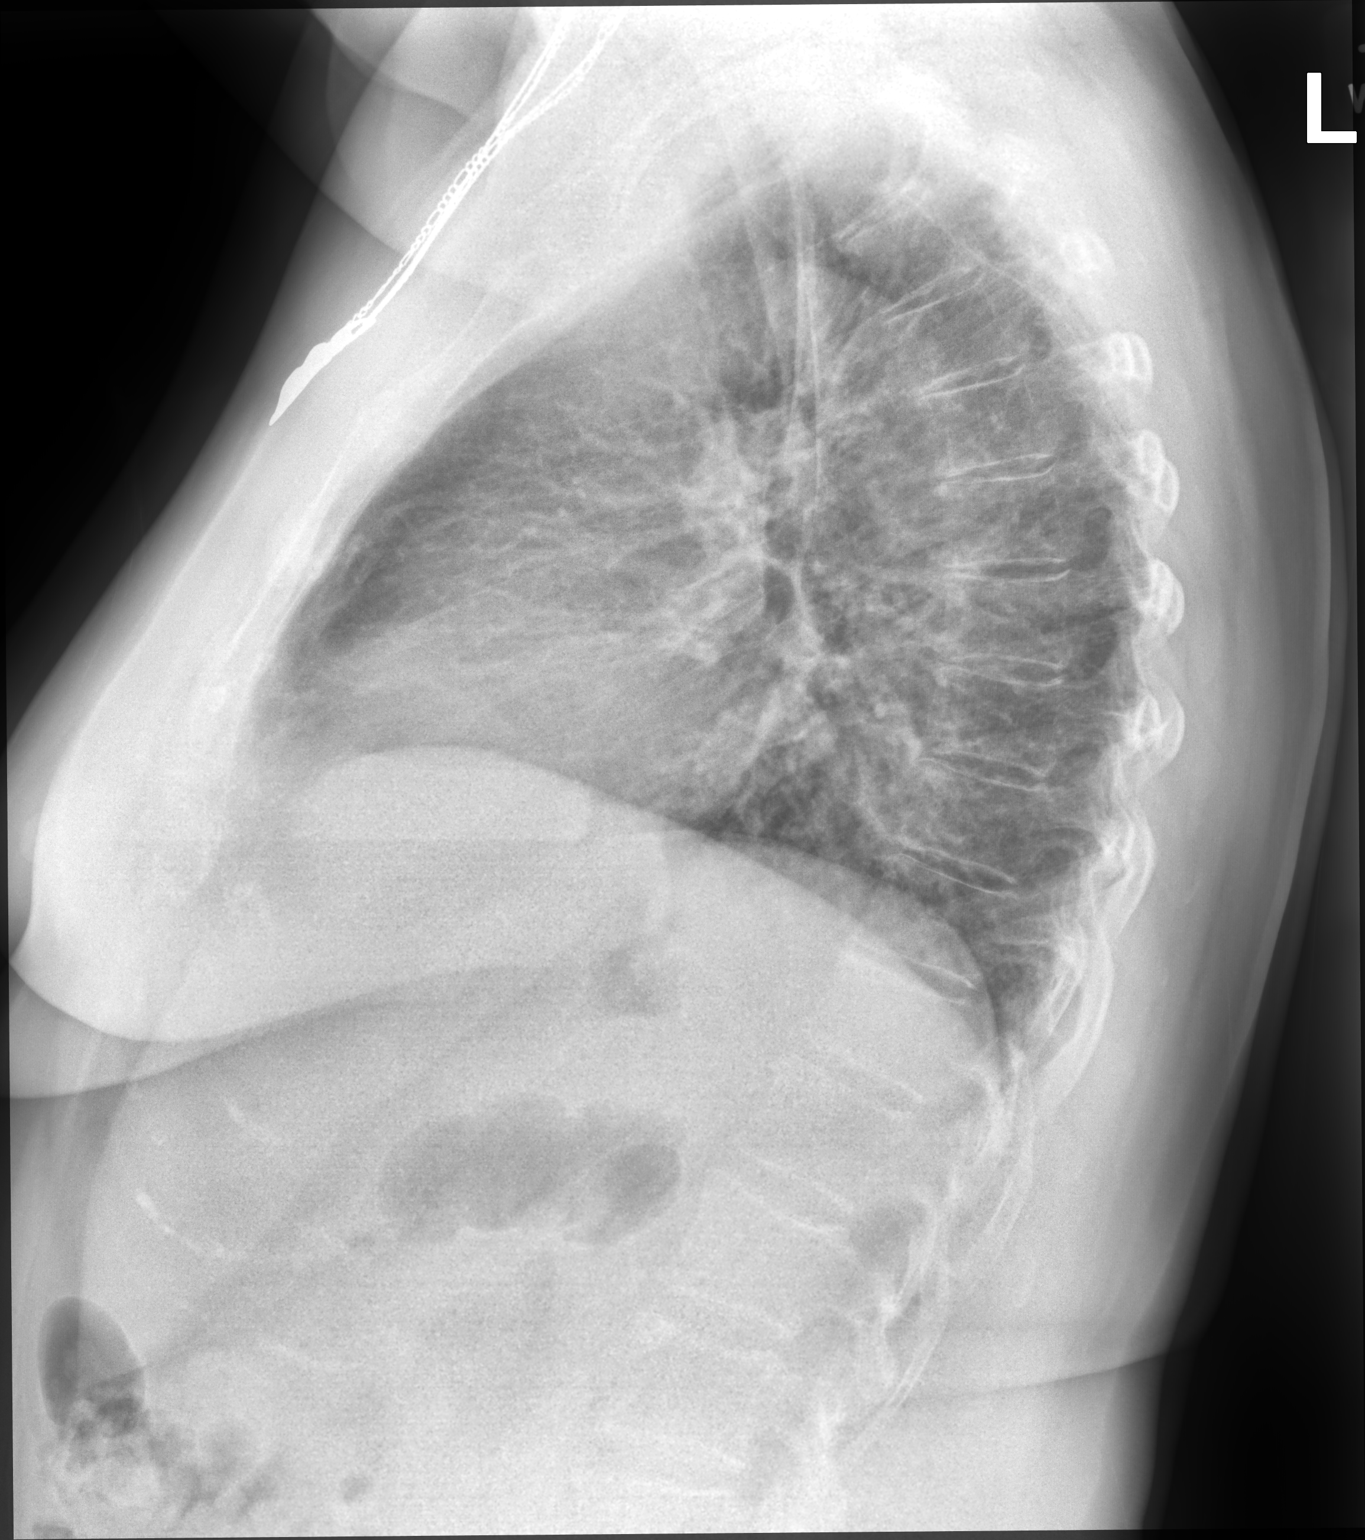

[2 of 2 positions shown; findings below may reference images not displayed]

FINDINGS: Cardiomediastinal silhouette unchanged in size and contour. No
evidence of central vascular congestion. No interlobular septal
thickening. No confluent airspace disease. No pneumothorax or
pleural effusion.

Chronic interstitial opacities, more pronounced than the prior,
though this is favored to be technique related.

No displaced fracture.
IMPRESSION: Chronic lung changes without evidence of superimposed acute
cardiopulmonary disease

## 2018-08-19 ENCOUNTER — Other Ambulatory Visit: Payer: Self-pay

## 2018-08-19 ENCOUNTER — Encounter: Payer: Self-pay | Admitting: Family Medicine

## 2018-08-19 DIAGNOSIS — I10 Essential (primary) hypertension: Secondary | ICD-10-CM

## 2018-08-19 DIAGNOSIS — E785 Hyperlipidemia, unspecified: Secondary | ICD-10-CM

## 2018-08-19 DIAGNOSIS — K2100 Gastro-esophageal reflux disease with esophagitis, without bleeding: Secondary | ICD-10-CM

## 2018-08-19 MED ORDER — AMLODIPINE BESYLATE 10 MG PO TABS: 10.0000 mg | ORAL_TABLET | Freq: Every day | ORAL | 2 refills | Status: DC

## 2018-08-19 MED ORDER — LISINOPRIL 40 MG PO TABS: 40.0000 mg | ORAL_TABLET | Freq: Every day | ORAL | 2 refills | Status: DC

## 2018-08-19 MED ORDER — ROSUVASTATIN CALCIUM 5 MG PO TABS: 5.0000 mg | ORAL_TABLET | Freq: Every day | ORAL | 0 refills | Status: DC

## 2018-08-19 MED ORDER — METOPROLOL SUCCINATE ER 100 MG PO TB24: 100.0000 mg | ORAL_TABLET | Freq: Every day | ORAL | 2 refills | Status: DC

## 2018-08-19 MED ORDER — AMLODIPINE BESYLATE 10 MG PO TABS: 10.0000 mg | ORAL_TABLET | Freq: Every day | ORAL | 0 refills | Status: DC

## 2018-08-19 MED ORDER — VENLAFAXINE HCL ER 150 MG PO CP24: 150.0000 mg | ORAL_CAPSULE | Freq: Every day | ORAL | 2 refills | Status: DC

## 2018-08-19 MED ORDER — OMEPRAZOLE 10 MG PO CPDR: 10.0000 mg | DELAYED_RELEASE_CAPSULE | Freq: Every day | ORAL | 2 refills | Status: DC

## 2018-08-19 MED ORDER — ROSUVASTATIN CALCIUM 5 MG PO TABS: 5.0000 mg | ORAL_TABLET | Freq: Every day | ORAL | 2 refills | Status: DC

## 2018-08-20 ENCOUNTER — Other Ambulatory Visit: Payer: Self-pay | Admitting: Cardiovascular Disease

## 2018-08-20 DIAGNOSIS — E785 Hyperlipidemia, unspecified: Secondary | ICD-10-CM

## 2018-08-26 ENCOUNTER — Other Ambulatory Visit: Payer: Self-pay | Admitting: Family Medicine

## 2018-08-26 ENCOUNTER — Encounter: Payer: Self-pay | Admitting: Family Medicine

## 2018-08-26 DIAGNOSIS — I10 Essential (primary) hypertension: Secondary | ICD-10-CM

## 2018-09-03 ENCOUNTER — Encounter: Payer: Self-pay | Admitting: Family Medicine

## 2018-09-05 ENCOUNTER — Encounter: Payer: Self-pay | Admitting: Family Medicine

## 2018-09-05 ENCOUNTER — Ambulatory Visit (INDEPENDENT_AMBULATORY_CARE_PROVIDER_SITE_OTHER): Payer: PPO | Admitting: Family Medicine

## 2018-09-05 DIAGNOSIS — J189 Pneumonia, unspecified organism: Secondary | ICD-10-CM | POA: Insufficient documentation

## 2018-09-05 DIAGNOSIS — N3000 Acute cystitis without hematuria: Secondary | ICD-10-CM | POA: Diagnosis not present

## 2018-09-05 DIAGNOSIS — N390 Urinary tract infection, site not specified: Secondary | ICD-10-CM | POA: Insufficient documentation

## 2018-09-05 MED ORDER — LEVOFLOXACIN 750 MG PO TABS: 750.0000 mg | ORAL_TABLET | Freq: Every day | ORAL | 0 refills | Status: DC

## 2018-09-05 MED ORDER — LEVOFLOXACIN 250 MG PO TABS: 250.0000 mg | ORAL_TABLET | Freq: Every day | ORAL | 0 refills | Status: DC

## 2018-09-05 NOTE — Assessment & Plan Note (Signed)
>  25 minutes spent in face to face time with patient, >50% spent in counselling or coordination of care Classic symptoms of cystitis.  Treat with Levaquin 250 mg daily x 3 days (discussed risks). Also send in rx for Levaquin to fill but NOT take due to compromise respiratory status. She will call me first if she develops respiratory symptoms.  Follow up webex on Monday The patient indicates understanding of these issues and agrees with the plan.

## 2018-09-05 NOTE — Progress Notes (Signed)
Virtual Visit via Video   I connected with Christy Freeman on 09/05/18 at 10:40 AM EDT by a video enabled telemedicine application and verified that I am speaking with the correct person using two identifiers. Location patient: Home Location provider: [facility], Office Persons participating in the virtual visit: Juliet Vasbinder, Arnette Norris, MD   I discussed the limitations of evaluation and management by telemedicine and the availability of in person appointments. The patient expressed understanding and agreed to proceed.  Subjective:   HPI:   Patient sent the following my chart message on 09/03/2018-  Sunday morning (3/29) woke up at 2am with fever of 102.9 and chills. "Rumbling" intestines but no diarrhea.   Fever gradually fell during the day. Next morning back to normal. Spiked up to a little high 99.5 during   Monday but felt ok. Felt slight chills once.   Have noticed my urine smells especially strong. Have not pain or burning but wondering about treatment for a U.T.I.?  Thank you   Today she is having increased urinary frequency and dysuria.  No hematuria. +suprapubic pain No fevers.  She is concerned that she had COVID given her compromised lung function.  Has a slight cough but thinks that is allergies. ROS: See pertinent positives and negatives per HPI.  Review of Systems  Constitutional: Positive for fever.  Respiratory: Positive for cough. Negative for apnea, choking, chest tightness, shortness of breath, wheezing and stridor.   Gastrointestinal: Negative.   Genitourinary: Positive for dysuria, frequency and urgency. Negative for decreased urine volume, enuresis, flank pain, genital sores, hematuria, menstrual problem, pelvic pain, vaginal bleeding, vaginal discharge and vaginal pain.  All other systems reviewed and are negative.    Patient Active Problem List   Diagnosis Date Noted  . Influenza A with pneumonia 06/12/2018  . Vitamin D deficiency 09/25/2017   . Aortic atherosclerosis ([place]) 02/10/2015  . Endometrial thickening on ultra sound 09/16/2014  . Postmenopausal HRT (hormone replacement therapy) 05/12/2014  . Solitary pulmonary nodule 10/13/2013  . Obesity 09/19/2013  . Steatohepatitis 09/19/2013  . Hypertension 02/05/2013  . GERD (gastroesophageal reflux disease) 02/05/2013  . Hyperlipidemia 02/05/2013  . Depression 02/05/2013    Social History   Tobacco Use  . Smoking status: Never Smoker  . Smokeless tobacco: Never Used  Substance Use Topics  . Alcohol use: Yes    Alcohol/week: 14.0 standard drinks    Types: 6 Glasses of wine, 8 Standard drinks or equivalent per week    Comment: 2 glasses of wine per night    Current Outpatient Medications:  .  amLODipine (NORVASC) 10 MG tablet, Take 1 tablet (10 mg total) by mouth daily., Disp: 90 tablet, Rfl: 2 .  cetirizine (ZYRTEC) 10 MG tablet, Take 10 mg by mouth daily., Disp: , Rfl:  .  lisinopril (PRINIVIL,ZESTRIL) 40 MG tablet, Take 1 tablet (40 mg total) by mouth daily., Disp: 90 tablet, Rfl: 2 .  metoprolol succinate (TOPROL-XL) 100 MG 24 hr tablet, TAKE 1 TABLET BY MOUTH ONCE DAILY TAKE WITH OR IMMEADIATELY FOLLOWING A MEAL, Disp: 90 tablet, Rfl: 2 .  omeprazole (PRILOSEC) 10 MG capsule, Take 1 capsule (10 mg total) by mouth daily., Disp: 90 capsule, Rfl: 2 .  Probiotic Product (PROBIOTIC DAILY PO), Take 1 capsule by mouth as needed. , Disp: , Rfl:  .  rosuvastatin (CRESTOR) 5 MG tablet, Take 1 tablet (5 mg total) by mouth at bedtime., Disp: 90 tablet, Rfl: 2 .  venlafaxine XR (EFFEXOR-XR) 150 MG 24 hr  capsule, Take 1 capsule (150 mg total) by mouth daily., Disp: 90 capsule, Rfl: 2  No Known Allergies  Objective:  Temp 98.2 F (36.8 C) (Oral)   VITALS: Per patient if applicable, see vitals. GENERAL: Alert, appears well and in no acute distress. HEENT: Atraumatic, conjunctiva clear, no obvious abnormalities on inspection of external nose and ears. NECK: Normal movements of  the head and neck. CARDIOPULMONARY: No increased WOB. Speaking in clear sentences. I:E ratio WNL.  MS: Moves all visible extremities without noticeable abnormality. PSYCH: Pleasant and cooperative, well-groomed. Speech normal rate and rhythm. Affect is appropriate. Insight and judgement are appropriate. Attention is focused, linear, and appropriate.  NEURO: CN grossly intact. Oriented as arrived to appointment on time with no prompting. Moves both UE equally.  SKIN: No obvious lesions, wounds, erythema, or cyanosis noted on face or hands. Abd:  Some suprapubic tenderness.  Assessment and Plan:   There are no diagnoses linked to this encounter.  . Reviewed expectations re: course of current medical issues. . Discussed self-management of symptoms. . Outlined signs and symptoms indicating need for more acute intervention. . Patient verbalized understanding and all questions were answered. Marland Kitchen Health Maintenance issues including appropriate healthy diet, exercise, and smoking avoidance were discussed with patient. . See orders for this visit as documented in the electronic medical record.  Arnette Norris, MD 09/05/2018

## 2018-09-08 NOTE — Progress Notes (Signed)
Virtual Visit via Video   I connected with Christy Freeman on 09/08/18 at  8:00 AM EDT by a video enabled telemedicine application and verified that I am speaking with the correct person using two identifiers. Location patient: Home Location provider: [facility], Office Persons participating in the virtual visit: Kiona Blume, Arnette Norris, MD   I discussed the limitations of evaluation and management by telemedicine and the availability of in person appointments. The patient expressed understanding and agreed to proceed.  Subjective:   HPI: Follow up.  Last had a telemedicine visit with Christy Freeman on 09/05/18.  Note reviewed.  Patient sent the following my chart message on 09/03/2018-  Sunday morning (3/29) woke up at 2am with fever of 102.9 and chills. "Rumbling" intestines but no diarrhea.   Fever gradually fell during the day. Next morning back to normal. Spiked up to a little high 99.5 during   Monday but felt ok. Felt slight chills once.   Have noticed my urine smells especially strong. Have not pain or burning but wondering about treatment for a U.T.I.?  Thank you   When I saw spoke with her and evaluated her via telemedicine platform on 09/05/18, she was having increased urinary frequency and dysuria.  No hematuria. +suprapubic pain on exam when I asked her to press on her abdomen and suprapubic area.  No CVA tenderness on her self exam. No fever since 09/01/18.  She is concerned that she had COVID given her compromised lung function.  Has a slight cough but thinks that is allergies.  We spoke at length about her symptoms.  She does have compromised lung function and is very concerned about COVID.  At the time of our visit, her symptoms seemed far more consistent with a UTI.  She did not appear toxic on video exam and she was afebrile.  Given that she did have classic symptoms of cystitis and we have been trying to keep immunocompromised patients out of the clinic, we did agree  to treat her UT with Levaquin 250 mg daily x 3 days (discussed risks). Also send in rx for Levaquin to fill for PNA  due to compromise respiratory status but to NOT take this. She will call me first if she develops respiratory symptoms first.  Today she is feeling much better.  Last low grade temp was yesterday was 99.4. Urinary symptoms have resolved. Has not had a cough.  ROS: See pertinent positives and negatives per HPI.  Review of Systems  Constitutional: Positive for fatigue.  HENT: Negative.   Eyes: Negative.   Respiratory: Negative.   Cardiovascular: Negative.   Gastrointestinal: Negative.   Endocrine: Negative.   Genitourinary: Negative.   Musculoskeletal: Negative.   Skin: Negative.   Allergic/Immunologic: Negative.   Neurological: Negative.   Psychiatric/Behavioral: Negative.   All other systems reviewed and are negative.    Patient Active Problem List   Diagnosis Date Noted  . UTI (urinary tract infection) 09/05/2018  . Recurrent pneumonia 09/05/2018  . Influenza A with pneumonia 06/12/2018  . Vitamin D deficiency 09/25/2017  . Aortic atherosclerosis ([place]) 02/10/2015  . Endometrial thickening on ultra sound 09/16/2014  . Postmenopausal HRT (hormone replacement therapy) 05/12/2014  . Solitary pulmonary nodule 10/13/2013  . Obesity 09/19/2013  . Steatohepatitis 09/19/2013  . Hypertension 02/05/2013  . GERD (gastroesophageal reflux disease) 02/05/2013  . Hyperlipidemia 02/05/2013  . Depression 02/05/2013    Social History   Tobacco Use  . Smoking status: Never Smoker  . Smokeless tobacco: Never  Used  Substance Use Topics  . Alcohol use: Yes    Alcohol/week: 14.0 standard drinks    Types: 6 Glasses of wine, 8 Standard drinks or equivalent per week    Comment: 2 glasses of wine per night    Current Outpatient Medications:  .  amLODipine (NORVASC) 10 MG tablet, Take 1 tablet (10 mg total) by mouth daily., Disp: 90 tablet, Rfl: 2 .  cetirizine (ZYRTEC)  10 MG tablet, Take 10 mg by mouth daily., Disp: , Rfl:  .  levofloxacin (LEVAQUIN) 250 MG tablet, Take 1 tablet (250 mg total) by mouth daily., Disp: 3 tablet, Rfl: 0 .  levofloxacin (LEVAQUIN) 750 MG tablet, Take 1 tablet (750 mg total) by mouth daily., Disp: 7 tablet, Rfl: 0 .  lisinopril (PRINIVIL,ZESTRIL) 40 MG tablet, Take 1 tablet (40 mg total) by mouth daily., Disp: 90 tablet, Rfl: 2 .  metoprolol succinate (TOPROL-XL) 100 MG 24 hr tablet, TAKE 1 TABLET BY MOUTH ONCE DAILY TAKE WITH OR IMMEADIATELY FOLLOWING A MEAL, Disp: 90 tablet, Rfl: 2 .  omeprazole (PRILOSEC) 10 MG capsule, Take 1 capsule (10 mg total) by mouth daily., Disp: 90 capsule, Rfl: 2 .  Probiotic Product (PROBIOTIC DAILY PO), Take 1 capsule by mouth as needed. , Disp: , Rfl:  .  rosuvastatin (CRESTOR) 5 MG tablet, Take 1 tablet (5 mg total) by mouth at bedtime., Disp: 90 tablet, Rfl: 2 .  venlafaxine XR (EFFEXOR-XR) 150 MG 24 hr capsule, Take 1 capsule (150 mg total) by mouth daily., Disp: 90 capsule, Rfl: 2  No Known Allergies  Objective:  There were no vitals taken for this visit.  VITALS: Per patient if applicable, see vitals. GENERAL: Alert, appears well and in no acute distress. HEENT: Atraumatic, conjunctiva clear, no obvious abnormalities on inspection of external nose and ears. NECK: Normal movements of the head and neck. CARDIOPULMONARY: No increased WOB. Speaking in clear sentences. I:E ratio WNL.  MS: Moves all visible extremities without noticeable abnormality. PSYCH: Pleasant and cooperative, well-groomed. Speech normal rate and rhythm. Affect is appropriate. Insight and judgement are appropriate. Attention is focused, linear, and appropriate.  NEURO: CN grossly intact. Oriented as arrived to appointment on time with no prompting. Moves both UE equally.  SKIN: No obvious lesions, wounds, erythema, or cyanosis noted on face or hands.  Assessment and Plan:   Diagnoses and all orders for this visit:  Acute  cystitis without hematuria  Recurrent pneumonia    . Reviewed expectations re: course of current medical issues. . Discussed self-management of symptoms. . Outlined signs and symptoms indicating need for more acute intervention. . Patient verbalized understanding and all questions were answered. Marland Kitchen Health Maintenance issues including appropriate healthy diet, exercise, and smoking avoidance were discussed with patient. . See orders for this visit as documented in the electronic medical record.  Arnette Norris, MD 09/08/2018

## 2018-09-09 ENCOUNTER — Other Ambulatory Visit: Payer: Self-pay

## 2018-09-09 ENCOUNTER — Ambulatory Visit (INDEPENDENT_AMBULATORY_CARE_PROVIDER_SITE_OTHER): Payer: PPO | Admitting: Family Medicine

## 2018-09-09 ENCOUNTER — Encounter: Payer: Self-pay | Admitting: Family Medicine

## 2018-09-09 DIAGNOSIS — N3000 Acute cystitis without hematuria: Secondary | ICD-10-CM | POA: Diagnosis not present

## 2018-09-09 DIAGNOSIS — J189 Pneumonia, unspecified organism: Secondary | ICD-10-CM

## 2018-09-09 NOTE — Assessment & Plan Note (Signed)
Improved symptoms s/p 3 day course of Levaquin. Call or send my chart message prn if these symptoms worsen or fail to improve as anticipated. She will update me in a few days.

## 2018-09-10 ENCOUNTER — Ambulatory Visit: Payer: PPO

## 2018-10-03 ENCOUNTER — Other Ambulatory Visit: Payer: Self-pay | Admitting: Family Medicine

## 2018-10-03 NOTE — Telephone Encounter (Signed)
On 3.16.20 #90 +2 refills were sent in/thx dmf

## 2018-10-04 ENCOUNTER — Telehealth: Payer: Self-pay | Admitting: Family Medicine

## 2018-10-04 ENCOUNTER — Telehealth: Payer: Self-pay | Admitting: Cardiovascular Disease

## 2018-10-04 ENCOUNTER — Other Ambulatory Visit: Payer: Self-pay

## 2018-10-04 MED ORDER — VENLAFAXINE HCL ER 150 MG PO CP24: 150.0000 mg | ORAL_CAPSULE | Freq: Every day | ORAL | 2 refills | Status: DC

## 2018-10-04 NOTE — Telephone Encounter (Signed)
I resent this Rx in/I denied and asked pharmacy if they received the Rx that we sent in March for #90+2 refills and did not receive a response/thx dmf

## 2018-10-04 NOTE — Telephone Encounter (Signed)
Virtual Visit Pre-Appointment Phone Call  "(Name), I am calling you today to discuss your upcoming appointment. We are currently trying to limit exposure to the virus that causes COVID-19 by seeing patients at home rather than in the office."  1. "What is the BEST phone number to call the day of the visit?" - include this in appointment notes  2. Do you have or have access to (through a family member/friend) a smartphone with video capability that we can use for your visit?" a. If yes - list this number in appt notes as cell (if different from BEST phone #) and list the appointment type as a VIDEO visit in appointment notes b. If no - list the appointment type as a PHONE visit in appointment notes  3. Confirm consent - "In the setting of the current Covid19 crisis, you are scheduled for a (phone or video) visit with your provider on (date) at (time).  Just as we do with many in-office visits, in order for you to participate in this visit, we must obtain consent.  If you'd like, I can send this to your mychart (if signed up) or email for you to review.  Otherwise, I can obtain your verbal consent now.  All virtual visits are billed to your insurance company just like a normal visit would be.  By agreeing to a virtual visit, we'd like you to understand that the technology does not allow for your provider to perform an examination, and thus may limit your provider's ability to fully assess your condition. If your provider identifies any concerns that need to be evaluated in person, we will make arrangements to do so.  Finally, though the technology is pretty good, we cannot assure that it will always work on either your or our end, and in the setting of a video visit, we may have to convert it to a phone-only visit.  In either situation, we cannot ensure that we have a secure connection.  Are you willing to proceed?" STAFF: Did the patient verbally acknowledge consent to telehealth visit? Document  YES/NO here: yes  4. Advise patient to be prepared - "Two hours prior to your appointment, go ahead and check your blood pressure, pulse, oxygen saturation, and your weight (if you have the equipment to check those) and write them all down. When your visit starts, your provider will ask you for this information. If you have an Apple Watch or Kardia device, please plan to have heart rate information ready on the day of your appointment. Please have a pen and paper handy nearby the day of the visit as well."  5. Give patient instructions for MyChart download to smartphone OR Doximity/Doxy.me as below if video visit (depending on what platform provider is using)  6. Inform patient they will receive a phone call 15 minutes prior to their appointment time (may be from unknown caller ID) so they should be prepared to answer    TELEPHONE CALL NOTE  Kalanie Fewell has been deemed a candidate for a follow-up tele-health visit to limit community exposure during the Covid-19 pandemic. I spoke with the patient via phone to ensure availability of phone/video source, confirm preferred email & phone number, and discuss instructions and expectations.  I reminded Trevia Nop to be prepared with any vital sign and/or heart rhythm information that could potentially be obtained via home monitoring, at the time of her visit. I reminded Darleth Eustache to expect a phone call prior to her visit.  Ace Gins 10/04/2018 8:52 AM   INSTRUCTIONS FOR DOWNLOADING THE MYCHART APP TO SMARTPHONE  - The patient must first make sure to have activated MyChart and know their login information - If Apple, go to [employer] and type in MyChart in the search bar and download the app. If Android, ask patient to go to [employer] and type in [place] in the search bar and download the app. The app is free but as with any other app downloads, their phone may require them to verify saved payment information or Apple/Android  password.  - The patient will need to then log into the app with their MyChart username and password, and select [facility] as their healthcare provider to link the account. When it is time for your visit, go to the MyChart app, find appointments, and click Begin Video Visit. Be sure to Select Allow for your device to access the Microphone and Camera for your visit. You will then be connected, and your provider will be with you shortly.  **If they have any issues connecting, or need assistance please contact MyChart service desk [phone])**  **If using a computer, in order to ensure the best quality for their visit they will need to use either of the following Internet Browsers: [employer], or Google Chrome**  IF USING DOXIMITY or DOXY.ME - The patient will receive a link just prior to their visit by text.     FULL LENGTH CONSENT FOR TELE-HEALTH VISIT   I hereby voluntarily request, consent and authorize [facility] and its employed or contracted physicians, physician assistants, nurse practitioners or other licensed health care professionals (the Practitioner), to provide me with telemedicine health care services (the Services") as deemed necessary by the treating Practitioner. I acknowledge and consent to receive the Services by the Practitioner via telemedicine. I understand that the telemedicine visit will involve communicating with the Practitioner through live audiovisual communication technology and the disclosure of certain medical information by electronic transmission. I acknowledge that I have been given the opportunity to request an in-person assessment or other available alternative prior to the telemedicine visit and am voluntarily participating in the telemedicine visit.  I understand that I have the right to withhold or withdraw my consent to the use of telemedicine in the course of my care at any time, without affecting my right to future care or treatment,  and that the Practitioner or I may terminate the telemedicine visit at any time. I understand that I have the right to inspect all information obtained and/or recorded in the course of the telemedicine visit and may receive copies of available information for a reasonable fee.  I understand that some of the potential risks of receiving the Services via telemedicine include:   Delay or interruption in medical evaluation due to technological equipment failure or disruption;  Information transmitted may not be sufficient (e.g. poor resolution of images) to allow for appropriate medical decision making by the Practitioner; and/or   In rare instances, security protocols could fail, causing a breach of personal health information.  Furthermore, I acknowledge that it is my responsibility to provide information about my medical history, conditions and care that is complete and accurate to the best of my ability. I acknowledge that Practitioner's advice, recommendations, and/or decision may be based on factors not within their control, such as incomplete or inaccurate data provided by me or distortions of diagnostic images or specimens that may result from electronic transmissions. I understand that the practice  of medicine is not an Chief Strategy Officer and that Practitioner makes no warranties or guarantees regarding treatment outcomes. I acknowledge that I will receive a copy of this consent concurrently upon execution via email to the email address I last provided but may also request a printed copy by calling the office of [facility].    I understand that my insurance will be billed for this visit.   I have read or had this consent read to me.  I understand the contents of this consent, which adequately explains the benefits and risks of the Services being provided via telemedicine.   I have been provided ample opportunity to ask questions regarding this consent and the Services and have had my questions  answered to my satisfaction.  I give my informed consent for the services to be provided through the use of telemedicine in my medical care  By participating in this telemedicine visit I agree to the above.

## 2018-10-04 NOTE — Telephone Encounter (Signed)
Called patient because she is due for her AWV with [facility],This is to be a virtual visit. left message

## 2018-10-04 NOTE — Telephone Encounter (Signed)
Patient called about a medication. The medication is Venlafaxine 150 mg 24hr. Patient states that the pharmacy said that this rx was denied and wanted to know why. I did let the patient know that the medication most likely needed a PA for it to be approved. Patient just wanted to know that status of this PA. Phone number is [phone]. Please advise.

## 2018-10-06 NOTE — Progress Notes (Signed)
Virtual Visit via Video Note   This visit type was conducted due to national recommendations for restrictions regarding the COVID-19 Pandemic (e.g. social distancing) in an effort to limit this patient's exposure and mitigate transmission in our community.  Due to her co-morbid illnesses, this patient is at least at moderate risk for complications without adequate follow up.  This format is felt to be most appropriate for this patient at this time.  All issues noted in this document were discussed and addressed.  A limited physical exam was performed with this format.  Please refer to the patient's chart for her consent to telehealth for [facility].   I connected with  Christy Freeman on 10/06/18 by a video enabled telemedicine application and verified that I am speaking with the correct person using two identifiers. I discussed the limitations of evaluation and management by telemedicine. The patient expressed understanding and agreed to proceed.   Evaluation Performed:  Follow-up visit  Date:  10/06/2018 home ID:  Christy Freeman, DOB 1950/08/30, MRN [id number]  Patient Location:  [place] [address]   Provider location:   [facility], [place] office  PCP:  Lucille Passy, MD  Cardiologist:  Patsy Baltimore   Chief Complaint: Recent urinary tract infection    History of Present Illness:    Christy Freeman is a 68 y.o. female who presents via audio/video conferencing for a telehealth visit today.   The patient does not symptoms concerning for COVID-19 infection (fever, chills, cough, or new SHORTNESS OF BREATH).   Patient has a past medical history of fatty liver and LDL 190-200 mg/dL,  Previous evaluation in the ER following chest pains, which were ultimately felt to be due to noncardiac chest pain, poorly controlled blood pressure,  Stress test negative.  Minimal aortic atherosclerosis on prior CT scan lowered her LDL to 141 mg/dL in the past  with high fiber / low fat diet, exercise, and 30 lbs weight loss. She has regained all of this weight back LDL back up to 191 mg/dL now.  She presents for evaluation of hyperlipidemia, aortic atherosclerosis  Reports blood pressure has been relatively well controlled, Compliant with her blood pressure pills 143/82, before pills this AM 135/78 yesterday  Has appt with PMD this month for lab work Reports she has been compliant with her Crestor  Feels well in general, not doing yoga anymore but is active in the backyard and with family  Recent UTI Had a fever Better with medication  Denies any chest pain concerning for angina No shortness of breath on exertion Denies any side effects from her medications  Other past medical history reviewed  CT scan chest showed no coronary calcifications, minimal descending aorta calcification, none in the proximal carotid arteries.  Family history: Mother (MI at 10 y.o. ; Diabetes at age 38 y.o.). Father (TIA at 38 y.o.); Brother (MI at 7 y.o.) Social History: Drinks wine 4-6 glasses per week Previously drank 4 nights per week. She stopped 2009-2011 after seeing fatty liver results, but restarted in 2011 after stress levels increased. No tobacco use.   Liver history: Patient diagnosed with fatty liver on U/S 06/2007 by Dr. Gaye Pollack [facility], [place]). Her LFTs went all the way to normal she tells me once she stopped drinking alcohol in 2009 - then went up after restating alcohol. He checks LFTs annually. ALT/AST were in the 90's in 03/06/13, then improved to 40's late 03/24/13 after she reduced her alcohol  consumption from 4 days per week down to 2 nights per week (2-3 glasses per day).    Prior CV studies:   The following studies were reviewed today:    Past Medical History:  Diagnosis Date  . Alcohol abuse   . Blood transfusion without reported diagnosis   . Cancer (HCC)    basil cell on face  . Colon polyps   . GERD  (gastroesophageal reflux disease)   . H/O: depression   . Hypercholesterolemia    diet controlled  . Hypertension   . Lung nodule    left lung   . Thyroid disease    Past Surgical History:  Procedure Laterality Date  . APPENDECTOMY  06/14/2017  . COLONOSCOPY    . CYST REMOVAL HAND Left   . DILATION AND CURETTAGE OF UTERUS    . LAPAROSCOPIC APPENDECTOMY N/A 06/14/2017   Procedure: APPENDECTOMY LAPAROSCOPIC;  Surgeon: Clayburn Pert, MD;  Location: [facility] ORS;  Service: General;  Laterality: N/A;     No outpatient medications have been marked as taking for the 10/08/18 encounter (Appointment) with Minna Merritts, MD.     Allergies:   Patient has no known allergies.   Social History   Tobacco Use  . Smoking status: Never Smoker  . Smokeless tobacco: Never Used  Substance Use Topics  . Alcohol use: Yes    Alcohol/week: 14.0 standard drinks    Types: 6 Glasses of wine, 8 Standard drinks or equivalent per week    Comment: 2 glasses of wine per night  . Drug use: No     Current Outpatient Medications on File Prior to Visit  Medication Sig Dispense Refill  . amLODipine (NORVASC) 10 MG tablet Take 1 tablet (10 mg total) by mouth daily. 90 tablet 2  . cetirizine (ZYRTEC) 10 MG tablet Take 10 mg by mouth daily.    Marland Kitchen lisinopril (PRINIVIL,ZESTRIL) 40 MG tablet Take 1 tablet (40 mg total) by mouth daily. 90 tablet 2  . metoprolol succinate (TOPROL-XL) 100 MG 24 hr tablet TAKE 1 TABLET BY MOUTH ONCE DAILY TAKE WITH OR IMMEADIATELY FOLLOWING A MEAL 90 tablet 2  . omeprazole (PRILOSEC) 10 MG capsule Take 1 capsule (10 mg total) by mouth daily. 90 capsule 2  . Probiotic Product (PROBIOTIC DAILY PO) Take 1 capsule by mouth as needed.     . rosuvastatin (CRESTOR) 5 MG tablet Take 1 tablet (5 mg total) by mouth at bedtime. 90 tablet 2  . venlafaxine XR (EFFEXOR-XR) 150 MG 24 hr capsule Take 1 capsule (150 mg total) by mouth daily. 90 capsule 2   No current facility-administered medications  on file prior to visit.      Family Hx: The patient's family history includes Diabetes in her mother; Heart disease in her brother; Heart failure in her mother; Hypertension in her father and mother; Stroke in her father and maternal grandmother. There is no history of Colon cancer.  ROS:   Please see the history of present illness.    Review of Systems  Constitutional: Negative.   HENT: Negative.   Respiratory: Negative.   Cardiovascular: Negative.   Gastrointestinal: Negative.   Musculoskeletal: Negative.   Neurological: Negative.   Psychiatric/Behavioral: Negative.   All other systems reviewed and are negative.     Labs/Other Tests and Data Reviewed:    Recent Labs: No results found for requested labs within last 8760 hours.   Recent Lipid Panel Lab Results  Component Value Date/Time   CHOL 250 (H) 08/24/2017  08:59 AM   CHOL 258 (H) 01/28/2015 08:52 AM   TRIG 127.0 08/24/2017 08:59 AM   HDL 72.70 08/24/2017 08:59 AM   HDL 68 01/28/2015 08:52 AM   CHOLHDL 3 08/24/2017 08:59 AM   LDLCALC 152 (H) 08/24/2017 08:59 AM   LDLCALC 154 (H) 01/28/2015 08:52 AM   LDLDIRECT 191.0 07/30/2014 02:01 PM    Wt Readings from Last 3 Encounters:  06/12/18 190 lb 9.6 oz (86.5 kg)  06/04/18 192 lb 6.4 oz (87.3 kg)  01/21/18 191 lb 8 oz (86.9 kg)     Exam:    Vital Signs: Vital signs may also be detailed in the HPI There were no vitals taken for this visit.  Wt Readings from Last 3 Encounters:  06/12/18 190 lb 9.6 oz (86.5 kg)  06/04/18 192 lb 6.4 oz (87.3 kg)  01/21/18 191 lb 8 oz (86.9 kg)   Temp Readings from Last 3 Encounters:  09/05/18 98.2 F (36.8 C) (Oral)  06/12/18 98.9 F (37.2 C) (Oral)  06/04/18 (!) 101.1 F (38.4 C) (Oral)   BP Readings from Last 3 Encounters:  06/12/18 138/86  06/04/18 138/72  01/21/18 (!) 148/82   Pulse Readings from Last 3 Encounters:  06/12/18 72  06/04/18 98  01/21/18 79    130s over 80, pulse 70s, respirations 16  Well  nourished, well developed female in no acute distress. Constitutional:  oriented to person, place, and time. No distress.  Head: Normocephalic and atraumatic.  Eyes:  no discharge. No scleral icterus.  Neck: Normal range of motion. Neck supple.  Pulmonary/Chest: No audible wheezing, no distress, appears comfortable Musculoskeletal: Normal range of motion.  no  tenderness or deformity.  Neurological:   Coordination normal. Full exam not performed Skin:  No rash Psychiatric:  normal mood and affect. behavior is normal. Thought content normal.    ASSESSMENT & PLAN:    Aortic atherosclerosis (HCC) Mild disease, treating her cholesterol Tolerating Crestor Was previously on Zetia, may need to add this back  Essential hypertension Blood pressure is well controlled on today's visit. No changes made to the medications.  Gastroesophageal reflux disease with esophagitis Stable symptoms  Hyperlipidemia, unspecified hyperlipidemia type On Crestor 5 daily Has lab work with primary care If numbers still elevated may need to add Zetia  Obesity We have encouraged continued exercise, careful diet management in an effort to lose weight.    COVID-19 Education: The signs and symptoms of COVID-19 were discussed with the patient and how to seek care for testing (follow up with PCP or arrange E-visit).  The importance of social distancing was discussed today.  Patient Risk:   After full review of this patients clinical status, I feel that they are at least moderate risk at this time.  Time:   Today, I have spent 25 minutes with the patient with telehealth technology discussing the cardiac and medical problems/diagnoses detailed above   10 min spent reviewing the chart prior to patient visit today   Medication Adjustments/Labs and Tests Ordered: Current medicines are reviewed at length with the patient today.  Concerns regarding medicines are outlined above.   Tests Ordered: No tests ordered    Medication Changes: No changes made   Disposition: Follow-up in 12 months   Signed, Ida Rogue, MD  10/06/2018 3:57 PM    [place] [facility] [address] [place] [address], [place], [place] [address]

## 2018-10-08 ENCOUNTER — Other Ambulatory Visit: Payer: Self-pay

## 2018-10-08 ENCOUNTER — Telehealth (INDEPENDENT_AMBULATORY_CARE_PROVIDER_SITE_OTHER): Payer: PPO | Admitting: Cardiovascular Disease

## 2018-10-08 DIAGNOSIS — K2100 Gastro-esophageal reflux disease with esophagitis, without bleeding: Secondary | ICD-10-CM

## 2018-10-08 DIAGNOSIS — E785 Hyperlipidemia, unspecified: Secondary | ICD-10-CM | POA: Diagnosis not present

## 2018-10-08 DIAGNOSIS — E6609 Other obesity due to excess calories: Secondary | ICD-10-CM

## 2018-10-08 DIAGNOSIS — I10 Essential (primary) hypertension: Secondary | ICD-10-CM | POA: Diagnosis not present

## 2018-10-08 DIAGNOSIS — I700 Atherosclerosis of aorta: Secondary | ICD-10-CM

## 2018-10-08 DIAGNOSIS — Z6830 Body mass index (BMI) 30.0-30.9, adult: Secondary | ICD-10-CM

## 2018-10-08 NOTE — Progress Notes (Signed)
Virtual Visit via Video Note  I connected with patient on 10/09/18 at  9:00 AM EDT by a video enabled telemedicine application and verified that I am speaking with the correct person using two identifiers.   THIS ENCOUNTER IS A VIRTUAL VISIT DUE TO COVID-19 - PATIENT WAS NOT SEEN IN THE OFFICE. PATIENT HAS CONSENTED TO VIRTUAL VISIT / TELEMEDICINE VISIT   Location of patient: home  Location of provider: office  I discussed the limitations of evaluation and management by telemedicine and the availability of in person appointments. The patient expressed understanding and agreed to proceed.   Subjective:   Christy Freeman is a 68 y.o. female who presents for Medicare Annual (Subsequent) preventive examination.  Review of Systems: No ROS.  Medicare Wellness Virtual Visit.  Visual/audio telehealth visit, UTA vital signs.   See social history for additional risk factors. Cardiac Risk Factors include: advanced age (>37mn, >>66women);hypertension;dyslipidemia Sleep patterns: nightmares on occasion  Home Safety/Smoke Alarms: Feels safe in home. Smoke alarms in place.  Lives with husband and 2 dogs (border collies) in 1 story home.   Female:   Pap- 09/25/17 normal       Mammo- scheduled 10/23/18      Dexa scan-12/10/17        CCS- 12/03/14. 5 yr recall [facility]      Objective:     Vitals: Unable to assess. This visit is enabled though telemedicine due to Covid 19.  Advanced Directives 10/09/2018 10/29/2017 08/15/2017 06/14/2017 06/14/2017 08/03/2016 12/03/2014  Does Patient Have a Medical Advance Directive? Yes No Yes - No Yes Yes  Type of [place]aramedicof [place] will - [place] - - [place] -  Does patient want to make changes to medical advance directive? No - Patient declined - No - Patient declined - - - -  Copy of [place] Chart? Yes -  validated most recent copy scanned in chart (See row information) - No - copy requested - - No - copy requested No - copy requested  Would patient like information on creating a medical advance directive? - No - Patient declined - No - Patient declined - - -    Tobacco Social History   Tobacco Use  Smoking Status Never Smoker  Smokeless Tobacco Never Used     Counseling given: Not Answered   Clinical Intake:     Pain : No/denies pain                 Past Medical History:  Diagnosis Date  . Alcohol abuse   . Blood transfusion without reported diagnosis   . Cancer (HCC)    basil cell on face  . Colon polyps   . GERD (gastroesophageal reflux disease)   . H/O: depression   . Hypercholesterolemia    diet controlled  . Hypertension   . Lung nodule    left lung   . Thyroid disease    Past Surgical History:  Procedure Laterality Date  . APPENDECTOMY  06/14/2017  . COLONOSCOPY    . CYST REMOVAL HAND Left   . DILATION AND CURETTAGE OF UTERUS    . LAPAROSCOPIC APPENDECTOMY N/A 06/14/2017   Procedure: APPENDECTOMY LAPAROSCOPIC;  Surgeon: WClayburn Pert MD;  Location: [facility] ORS;  Service: General;  Laterality: N/A;   Family History  Problem Relation Age of Onset  . Diabetes Mother   . Heart failure Mother   .  Hypertension Mother   . Stroke Father   . Hypertension Father   . Heart disease Brother   . Stroke Maternal Grandmother   . Colon cancer Neg Hx    Social History   Socioeconomic History  . Marital status: Married    Spouse name: Not on file  . Number of children: Not on file  . Years of education: Not on file  . Highest education level: Not on file  Occupational History  . Not on file  Social Needs  . Financial resource strain: Not on file  . Food insecurity:    Worry: Not on file    Inability: Not on file  . Transportation needs:    Medical: Not on file    Non-medical: Not on file  Tobacco Use  . Smoking status: Never Smoker  . Smokeless  tobacco: Never Used  Substance and Sexual Activity  . Alcohol use: Yes    Alcohol/week: 14.0 standard drinks    Types: 6 Glasses of wine, 8 Standard drinks or equivalent per week    Comment: 2 glasses of wine per night  . Drug use: No  . Sexual activity: Yes  Lifestyle  . Physical activity:    Days per week: Not on file    Minutes per session: Not on file  . Stress: Not on file  Relationships  . Social connections:    Talks on phone: Not on file    Gets together: Not on file    Attends religious service: Not on file    Active member of club or organization: Not on file    Attends meetings of clubs or organizations: Not on file    Relationship status: Not on file  Other Topics Concern  . Not on file  Social History Narrative   She would desire CPR.   Does have a living will.    Outpatient Encounter Medications as of 10/09/2018  Medication Sig  . amLODipine (NORVASC) 10 MG tablet Take 1 tablet (10 mg total) by mouth daily.  . cetirizine (ZYRTEC) 10 MG tablet Take 10 mg by mouth daily.  Marland Kitchen lisinopril (PRINIVIL,ZESTRIL) 40 MG tablet Take 1 tablet (40 mg total) by mouth daily.  . metoprolol succinate (TOPROL-XL) 100 MG 24 hr tablet TAKE 1 TABLET BY MOUTH ONCE DAILY TAKE WITH OR IMMEADIATELY FOLLOWING A MEAL  . omeprazole (PRILOSEC) 10 MG capsule Take 1 capsule (10 mg total) by mouth daily.  . Probiotic Product (PROBIOTIC DAILY PO) Take 1 capsule by mouth as needed.   . rosuvastatin (CRESTOR) 5 MG tablet Take 1 tablet (5 mg total) by mouth at bedtime.  Marland Kitchen venlafaxine XR (EFFEXOR-XR) 150 MG 24 hr capsule Take 1 capsule (150 mg total) by mouth daily.   No facility-administered encounter medications on file as of 10/09/2018.     Activities of Daily Living In your present state of health, do you have any difficulty performing the following activities: 10/09/2018 09/05/2018  Hearing? N N  Comment hearing aid right ear -  Vision? N N  Comment wears glasses -  Difficulty concentrating or  making decisions? N N  Walking or climbing stairs? N N  Dressing or bathing? N N  Doing errands, shopping? N N  Preparing Food and eating ? N -  Using the Toilet? N -  In the past six months, have you accidently leaked urine? N -  Do you have problems with loss of bowel control? N -  Managing your Medications? N -  Managing your  Finances? N -  Housekeeping or managing your Housekeeping? N -  Some recent data might be hidden    Patient Care Team: Lucille Passy, MD as PCP - General (Family Medicine) Minna Merritts, MD as Consulting Physician (Cardiology) Ladene Artist, MD as Consulting Physician (Gastroenterology)    Assessment:   This is a routine wellness examination for [place]. Physical assessment deferred to PCP.  Exercise Activities and Dietary recommendations Current Exercise Habits: Home exercise routine, Type of exercise: walking, Time (Minutes): 30, Frequency (Times/Week): 7, Weekly Exercise (Minutes/Week): 210, Intensity: Mild, Exercise limited by: None identified Diet (meal preparation, eat out, water intake, caffeinated beverages, dairy products, fruits and vegetables): in general, a "healthy" diet  , well balanced, on average, 3 meals per day   Goals    . Increase physical activity     Starting 08/03/2016, I will continue to exercise at least 30 min 5 days per week.     Marland Kitchen LDL CALC < 130     < 130 at least, and would prefer LDL < 100 if possible given family history       Fall Risk Fall Risk  10/09/2018 09/05/2018 09/25/2017 08/15/2017 06/26/2017  Falls in the past year? 1 0 No No No  Number falls in past yr: 0 - - - -  Injury with Fall? 1 - - - -  Follow up - Falls evaluation completed - - -    Depression Screen PHQ 2/9 Scores 10/09/2018 09/05/2018 06/12/2018 09/25/2017  PHQ - 2 Score 0 2 - 0  PHQ- 9 Score - 2 - -  Exception Documentation - - Other- indicate reason in comment box -  Not completed - - Acute visit -     Cognitive Function Ad8 score reviewed for  issues:  Issues making decisions:no  Less interest in hobbies / activities:no  Repeats questions, stories (family complaining):no  Trouble using ordinary gadgets (microwave, computer, phone):no  Forgets the month or year: no  Mismanaging finances: no  Remembering appts:no  Daily problems with thinking and/or memory:no Ad8 score is=0     MMSE - Mini Mental State Exam 08/03/2016  Orientation to time 5  Orientation to Place 5  Registration 3  Attention/ Calculation 0  Recall 3  Language- name 2 objects 0  Language- repeat 1  Language- follow 3 step command 3  Language- read & follow direction 0  Write a sentence 0  Copy design 0  Total score 20        Immunization History  Administered Date(s) Administered  . Influenza Split 04/15/2013  . Influenza, Seasonal, Injecte, Preservative Fre 07/03/2016  . Influenza,inj,Quad PF,6+ Mos 05/12/2014  . Influenza-Unspecified 04/06/2017, 05/23/2018  . Pneumococcal Conjugate-13 08/03/2015  . Pneumococcal Polysaccharide-23 03/31/2013  . Td 08/08/2012  . Zoster 05/12/2012  . Zoster Recombinat (Shingrix) 03/15/2017    Screening Tests Health Maintenance  Topic Date Due  . PNA vac Low Risk Adult (2 of 2 - PPSV23) 06/05/2019 (Originally 03/31/2018)  . INFLUENZA VACCINE  01/04/2019  . MAMMOGRAM  05/26/2019  . COLONOSCOPY  12/03/2019  . TETANUS/TDAP  08/09/2022  . DEXA SCAN  Completed  . Hepatitis C Screening  Completed       Plan:    Please schedule your next medicare wellness visit with me in 1 yr.  Continue to eat heart healthy diet (full of fruits, vegetables, whole grains, lean protein, water--limit salt, fat, and sugar intake) and increase physical activity as tolerated.  Continue doing brain stimulating activities (puzzles,  reading, adult coloring books, staying active) to keep memory sharp.     I have personally reviewed and noted the following in the patient's chart:   . Medical and social history . Use of  alcohol, tobacco or illicit drugs  . Current medications and supplements . Functional ability and status . Nutritional status . Physical activity . Advanced directives . List of other physicians . Hospitalizations, surgeries, and ER visits in previous 12 months . Vitals . Screenings to include cognitive, depression, and falls . Referrals and appointments  In addition, I have reviewed and discussed with patient certain preventive protocols, quality metrics, and best practice recommendations. A written personalized care plan for preventive services as well as general preventive health recommendations were provided to patient.     Shela Nevin, [place]  10/09/2018

## 2018-10-08 NOTE — Patient Instructions (Signed)

## 2018-10-09 ENCOUNTER — Encounter: Payer: Self-pay | Admitting: *Deleted

## 2018-10-09 ENCOUNTER — Ambulatory Visit (INDEPENDENT_AMBULATORY_CARE_PROVIDER_SITE_OTHER): Payer: PPO | Admitting: *Deleted

## 2018-10-09 DIAGNOSIS — Z0000 Encounter for general adult medical examination without abnormal findings: Secondary | ICD-10-CM

## 2018-10-09 NOTE — Progress Notes (Signed)
I reviewed health advisor's note, was available for consultation, and agree with documentation and plan.  

## 2018-10-09 NOTE — Patient Instructions (Signed)
Please schedule your next medicare wellness visit with me in 1 yr.  Continue to eat heart healthy diet (full of fruits, vegetables, whole grains, lean protein, water--limit salt, fat, and sugar intake) and increase physical activity as tolerated.  Continue doing brain stimulating activities (puzzles, reading, adult coloring books, staying active) to keep memory sharp.    Christy Freeman , Thank you for taking time to come for your Medicare Wellness Visit. I appreciate your ongoing commitment to your health goals. Please review the following plan we discussed and let me know if I can assist you in the future.   These are the goals we discussed: Goals    . Increase physical activity     Starting 08/03/2016, I will continue to exercise at least 30 min 5 days per week.     Marland Kitchen LDL CALC < 130     < 130 at least, and would prefer LDL < 100 if possible given family history       This is a list of the screening recommended for you and due dates:  Health Maintenance  Topic Date Due  . Pneumonia vaccines (2 of 2 - PPSV23) 06/05/2019*  . Flu Shot  01/04/2019  . Mammogram  05/26/2019  . Colon Cancer Screening  12/03/2019  . Tetanus Vaccine  08/09/2022  . DEXA scan (bone density measurement)  Completed  .  Hepatitis C: One time screening is recommended by Center for Disease Control  (CDC) for  adults born from [birth year] through 1965.   Completed  *Topic was postponed. The date shown is not the original due date.    Health Maintenance After Age 60 After age 109, you are at a higher risk for certain long-term diseases and infections as well as injuries from falls. Falls are a major cause of broken bones and head injuries in people who are older than age 43. Getting regular preventive care can help to keep you healthy and well. Preventive care includes getting regular testing and making lifestyle changes as recommended by your health care provider. Talk with your health care provider about:  Which screenings  and tests you should have. A screening is a test that checks for a disease when you have no symptoms.  A diet and exercise plan that is right for you. What should I know about screenings and tests to prevent falls? Screening and testing are the best ways to find a health problem early. Early diagnosis and treatment give you the best chance of managing medical conditions that are common after age 32. Certain conditions and lifestyle choices may make you more likely to have a fall. Your health care provider may recommend:  Regular vision checks. Poor vision and conditions such as cataracts can make you more likely to have a fall. If you wear glasses, make sure to get your prescription updated if your vision changes.  Medicine review. Work with your health care provider to regularly review all of the medicines you are taking, including over-the-counter medicines. Ask your health care provider about any side effects that may make you more likely to have a fall. Tell your health care provider if any medicines that you take make you feel dizzy or sleepy.  Osteoporosis screening. Osteoporosis is a condition that causes the bones to get weaker. This can make the bones weak and cause them to break more easily.  Blood pressure screening. Blood pressure changes and medicines to control blood pressure can make you feel dizzy.  Strength and balance checks.  Your health care provider may recommend certain tests to check your strength and balance while standing, walking, or changing positions.  Foot health exam. Foot pain and numbness, as well as not wearing proper footwear, can make you more likely to have a fall.  Depression screening. You may be more likely to have a fall if you have a fear of falling, feel emotionally low, or feel unable to do activities that you used to do.  Alcohol use screening. Using too much alcohol can affect your balance and may make you more likely to have a fall. What actions can I  take to lower my risk of falls? General instructions  Talk with your health care provider about your risks for falling. Tell your health care provider if: ? You fall. Be sure to tell your health care provider about all falls, even ones that seem minor. ? You feel dizzy, sleepy, or off-balance.  Take over-the-counter and prescription medicines only as told by your health care provider. These include any supplements.  Eat a healthy diet and maintain a healthy weight. A healthy diet includes low-fat dairy products, low-fat (lean) meats, and fiber from whole grains, beans, and lots of fruits and vegetables. Home safety  Remove any tripping hazards, such as rugs, cords, and clutter.  Install safety equipment such as grab bars in bathrooms and safety rails on stairs.  Keep rooms and walkways well-lit. Activity   Follow a regular exercise program to stay fit. This will help you maintain your balance. Ask your health care provider what types of exercise are appropriate for you.  If you need a cane or walker, use it as recommended by your health care provider.  Wear supportive shoes that have nonskid soles. Lifestyle  Do not drink alcohol if your health care provider tells you not to drink.  If you drink alcohol, limit how much you have: ? 0-1 drink a day for women. ? 0-2 drinks a day for men.  Be aware of how much alcohol is in your drink. In the [place] one drink equals one typical bottle of beer (12 oz), one-half glass of wine (5 oz), or one shot of hard liquor (1 oz).  Do not use any products that contain nicotine or tobacco, such as cigarettes and e-cigarettes. If you need help quitting, ask your health care provider. Summary  Having a healthy lifestyle and getting preventive care can help to protect your health and wellness after age 20.  Screening and testing are the best way to find a health problem early and help you avoid having a fall. Early diagnosis and treatment give you  the best chance for managing medical conditions that are more common for people who are older than age 39.  Falls are a major cause of broken bones and head injuries in people who are older than age 8. Take precautions to prevent a fall at home.  Work with your health care provider to learn what changes you can make to improve your health and wellness and to prevent falls. This information is not intended to replace advice given to you by your health care provider. Make sure you discuss any questions you have with your health care provider. Document Released: 04/04/2017 Document Revised: 04/04/2017 Document Reviewed: 04/04/2017 Elsevier Interactive Patient Education  2019 [employer].

## 2018-10-23 ENCOUNTER — Ambulatory Visit: Payer: PPO

## 2018-11-19 ENCOUNTER — Encounter: Payer: PPO | Admitting: Family Medicine

## 2018-11-20 ENCOUNTER — Encounter: Payer: PPO | Admitting: Family Medicine

## 2018-11-26 ENCOUNTER — Telehealth: Payer: Self-pay

## 2018-11-26 ENCOUNTER — Other Ambulatory Visit: Payer: Self-pay | Admitting: Family Medicine

## 2018-11-26 DIAGNOSIS — I10 Essential (primary) hypertension: Secondary | ICD-10-CM

## 2018-11-26 NOTE — Telephone Encounter (Signed)
Yes we are very cautious and I would feel safe brining her in.

## 2018-11-26 NOTE — Telephone Encounter (Signed)
Dr. Deborra Medina please advise okay to cancel appointment?  Copied from [place] [id number]. Topic: Appointment Scheduling - Scheduling Inquiry for Clinic >> Nov 26, 2018  8:55 AM Christy Freeman A wrote: Reason for CRM: Pt called and is requesting a call back regarding her physical. Pt is concerned that it may still be dangerous for her to come into office due to trouble with her lungs. Pt is requesting advice from PCP.

## 2018-11-27 NOTE — Telephone Encounter (Signed)
AD-Would you mind calling this patient and advising her that it is safe for her to get her physical here in the office but if she wants to wait a while it is ok  If she wants to schedule can you please put her somewhere that is fitting? Plz advise/thx dmf

## 2018-11-28 NOTE — Telephone Encounter (Signed)
Pt wants to wait and with Dr Deborra Medina only in office 2 days the next availble with CPE restrictions is in late August, I scheduled her and she also wanted to do a virtual visit sooner about other things so I set her up for Tuesday

## 2018-12-03 ENCOUNTER — Telehealth: Payer: Self-pay

## 2018-12-03 ENCOUNTER — Encounter: Payer: Self-pay | Admitting: Family Medicine

## 2018-12-03 ENCOUNTER — Telehealth (INDEPENDENT_AMBULATORY_CARE_PROVIDER_SITE_OTHER): Payer: PPO | Admitting: Family Medicine

## 2018-12-03 VITALS — Wt 186.0 lb

## 2018-12-03 DIAGNOSIS — N3000 Acute cystitis without hematuria: Secondary | ICD-10-CM

## 2018-12-03 MED ORDER — LEVOFLOXACIN 250 MG PO TABS: 250.0000 mg | ORAL_TABLET | Freq: Every day | ORAL | 0 refills | Status: AC

## 2018-12-03 NOTE — Progress Notes (Signed)
Virtual Visit via Video   Due to the COVID-19 pandemic, this visit was completed with telemedicine (audio/video) technology to reduce patient and provider exposure as well as to preserve personal protective equipment.   I connected with Christy Freeman by a video enabled telemedicine application and verified that I am speaking with the correct person using two identifiers. Location patient: Home Location provider: [facility], Office Persons participating in the virtual visit: Christy Freeman, Arnette Norris, MD   I discussed the limitations of evaluation and management by telemedicine and the availability of in person appointments. The patient expressed understanding and agreed to proceed.  Care Team   Patient Care Team: Christy Passy, MD as PCP - General (Family Medicine) Minna Merritts, MD as Consulting Physician (Cardiology) Ladene Artist, MD as Consulting Physician (Gastroenterology)  Subjective:   HPI:   Dysuria- increased urgency, dysuria, increased frequency.  Symptoms started 4 days ago. No back pain, fever, nausea or vomiting.  Feels similar to last UTI.  levaquin works best for her UTIs.  She is very nervous about coming to the office for a UA or any labs, appointments.    Actually plans on rescheduling her mammogram that is currently scheduled 2 days from now.  Review of Systems  Constitutional: Negative.   HENT: Negative.   Respiratory: Negative.   Cardiovascular: Negative.   Gastrointestinal: Negative.   Genitourinary: Positive for dysuria, frequency and urgency. Negative for flank pain and hematuria.  Musculoskeletal: Negative.   Skin: Negative.   Neurological: Negative.   Endo/Heme/Allergies: Negative.   Psychiatric/Behavioral: Negative.   All other systems reviewed and are negative.    Patient Active Problem List   Diagnosis Date Noted  . UTI (urinary tract infection) 09/05/2018  . Recurrent pneumonia 09/05/2018  . Influenza A with pneumonia  06/12/2018  . Vitamin D deficiency 09/25/2017  . Aortic atherosclerosis ([place]) 02/10/2015  . Endometrial thickening on ultra sound 09/16/2014  . Postmenopausal HRT (hormone replacement therapy) 05/12/2014  . Solitary pulmonary nodule 10/13/2013  . Obesity 09/19/2013  . Steatohepatitis 09/19/2013  . Hypertension 02/05/2013  . GERD (gastroesophageal reflux disease) 02/05/2013  . Hyperlipidemia 02/05/2013  . Depression 02/05/2013    Social History   Tobacco Use  . Smoking status: Never Smoker  . Smokeless tobacco: Never Used  Substance Use Topics  . Alcohol use: Yes    Alcohol/week: 14.0 standard drinks    Types: 6 Glasses of wine, 8 Standard drinks or equivalent per week    Comment: 2 glasses of wine per night    Current Outpatient Medications:  .  amLODipine (NORVASC) 10 MG tablet, Take 1 tablet (10 mg total) by mouth daily., Disp: 90 tablet, Rfl: 2 .  cetirizine (ZYRTEC) 10 MG tablet, Take 10 mg by mouth daily., Disp: , Rfl:  .  lisinopril (ZESTRIL) 40 MG tablet, TAKE 1 TABLET BY MOUTH EVERY DAY, Disp: 90 tablet, Rfl: 1 .  metoprolol succinate (TOPROL-XL) 100 MG 24 hr tablet, TAKE 1 TABLET BY MOUTH ONCE DAILY TAKE WITH OR IMMEADIATELY FOLLOWING A MEAL, Disp: 90 tablet, Rfl: 2 .  omeprazole (PRILOSEC) 10 MG capsule, Take 1 capsule (10 mg total) by mouth daily., Disp: 90 capsule, Rfl: 2 .  Probiotic Product (PROBIOTIC DAILY PO), Take 1 capsule by mouth as needed. , Disp: , Rfl:  .  rosuvastatin (CRESTOR) 5 MG tablet, Take 1 tablet (5 mg total) by mouth at bedtime., Disp: 90 tablet, Rfl: 2 .  venlafaxine XR (EFFEXOR-XR) 150 MG 24 hr capsule, Take  1 capsule (150 mg total) by mouth daily., Disp: 90 capsule, Rfl: 2 .  levofloxacin (LEVAQUIN) 250 MG tablet, Take 1 tablet (250 mg total) by mouth daily for 3 days., Disp: 3 tablet, Rfl: 0  No Known Allergies  Objective:  Wt 186 lb (84.4 kg)   BMI 31.93 kg/m   VITALS: Per patient if applicable, see vitals. GENERAL: Alert, appears well  and in no acute distress. HEENT: Atraumatic, conjunctiva clear, no obvious abnormalities on inspection of external nose and ears. NECK: Normal movements of the head and neck. CARDIOPULMONARY: No increased WOB. Speaking in clear sentences. I:E ratio WNL.  MS: Moves all visible extremities without noticeable abnormality. PSYCH: Pleasant and cooperative, well-groomed. Speech normal rate and rhythm. Affect is appropriate. Insight and judgement are appropriate. Attention is focused, linear, and appropriate.  NEURO: CN grossly intact. Oriented as arrived to appointment on time with no prompting. Moves both UE equally.  SKIN: No obvious lesions, wounds, erythema, or cyanosis noted on face or hands.  Depression screen [facility] 2/9 10/09/2018 09/05/2018 09/25/2017  Decreased Interest 0 0 0  Down, Depressed, Hopeless 0 2 0  PHQ - 2 Score 0 2 0  Altered sleeping - 0 -  Tired, decreased energy - 0 -  Change in appetite - 0 -  Feeling bad or failure about yourself  - 0 -  Trouble concentrating - 0 -  Moving slowly or fidgety/restless - 0 -  Suicidal thoughts - 0 -  PHQ-9 Score - 2 -  Difficult doing work/chores - Not difficult at all -    Assessment and Plan:   [name] was seen today for dysuria.  Diagnoses and all orders for this visit:  Acute cystitis without hematuria  Other orders -     levofloxacin (LEVAQUIN) 250 MG tablet; Take 1 tablet (250 mg total) by mouth daily for 3 days.    Marland Kitchen COVID-19 Education: The signs and symptoms of COVID-19 were discussed with the patient and how to seek care for testing if needed. The importance of social distancing was discussed today. . Reviewed expectations re: course of current medical issues. . Discussed self-management of symptoms. . Outlined signs and symptoms indicating need for more acute intervention. . Patient verbalized understanding and all questions were answered. Marland Kitchen Health Maintenance issues including appropriate healthy diet, exercise, and smoking  avoidance were discussed with patient. . See orders for this visit as documented in the electronic medical record.  Arnette Norris, MD  Records requested if needed. Time spent: 15 minutes, of which >50% was spent in obtaining information about her symptoms, reviewing her previous labs, evaluations, and treatments, counseling her about her condition (please see the discussed topics above), and developing a plan to further investigate it; she had a number of questions which I addressed.

## 2018-12-03 NOTE — Assessment & Plan Note (Signed)
New- symptoms consistent with acute cystitis and specifically with her typical symptoms with cystitis. She is afraid, given her medical history, to come for UA. Will treat with levaquin 250 mg daily x 3 days.    If symptoms do not improve or worsen, she agrees to come into the office for a UA.  The patient indicates understanding of these issues and agrees with the plan.

## 2018-12-03 NOTE — Telephone Encounter (Signed)
LMOVM for pt to RTC to go through check-in process/thx dmf

## 2018-12-04 ENCOUNTER — Ambulatory Visit: Payer: PPO

## 2019-01-14 ENCOUNTER — Ambulatory Visit: Payer: PPO

## 2019-01-27 ENCOUNTER — Encounter: Payer: PPO | Admitting: Family Medicine

## 2019-02-25 ENCOUNTER — Ambulatory Visit: Payer: PPO

## 2019-03-04 ENCOUNTER — Encounter: Payer: PPO | Admitting: Family Medicine

## 2019-03-12 ENCOUNTER — Encounter: Payer: PPO | Admitting: Family Medicine

## 2019-03-24 ENCOUNTER — Telehealth: Payer: Self-pay | Admitting: Family Medicine

## 2019-03-24 NOTE — Telephone Encounter (Signed)
Patient called in to ask Dr Deborra Medina does seh still want her to come in for appt [phone] due to her being high risk .  Please advise   Call back [phone]

## 2019-03-25 NOTE — Telephone Encounter (Signed)
We absolutely can due  Virtual visit on Thursday since we are seeing a spike in the community

## 2019-03-25 NOTE — Telephone Encounter (Signed)
TA-Do you want pt to come in for appt tomorrow as she is high risk? If she has MyChart set up then just shoot her a message/thx dmf

## 2019-03-26 ENCOUNTER — Ambulatory Visit: Payer: PPO

## 2019-03-26 ENCOUNTER — Other Ambulatory Visit: Payer: Self-pay

## 2019-03-26 ENCOUNTER — Ambulatory Visit (INDEPENDENT_AMBULATORY_CARE_PROVIDER_SITE_OTHER): Payer: PPO | Admitting: Family Medicine

## 2019-03-26 ENCOUNTER — Ambulatory Visit (INDEPENDENT_AMBULATORY_CARE_PROVIDER_SITE_OTHER): Payer: PPO

## 2019-03-26 ENCOUNTER — Encounter: Payer: Self-pay | Admitting: Family Medicine

## 2019-03-26 VITALS — BP 160/98 | HR 74 | Temp 98.8°F | Ht 64.5 in | Wt 193.8 lb

## 2019-03-26 DIAGNOSIS — E785 Hyperlipidemia, unspecified: Secondary | ICD-10-CM | POA: Diagnosis not present

## 2019-03-26 DIAGNOSIS — R21 Rash and other nonspecific skin eruption: Secondary | ICD-10-CM | POA: Insufficient documentation

## 2019-03-26 DIAGNOSIS — I700 Atherosclerosis of aorta: Secondary | ICD-10-CM

## 2019-03-26 DIAGNOSIS — M8580 Other specified disorders of bone density and structure, unspecified site: Secondary | ICD-10-CM

## 2019-03-26 DIAGNOSIS — I10 Essential (primary) hypertension: Secondary | ICD-10-CM | POA: Diagnosis not present

## 2019-03-26 DIAGNOSIS — R2241 Localized swelling, mass and lump, right lower limb: Secondary | ICD-10-CM | POA: Diagnosis not present

## 2019-03-26 DIAGNOSIS — Z23 Encounter for immunization: Secondary | ICD-10-CM | POA: Diagnosis not present

## 2019-03-26 DIAGNOSIS — F320 Major depressive disorder, single episode, mild: Secondary | ICD-10-CM | POA: Diagnosis not present

## 2019-03-26 DIAGNOSIS — M19039 Primary osteoarthritis, unspecified wrist: Secondary | ICD-10-CM | POA: Insufficient documentation

## 2019-03-26 DIAGNOSIS — E559 Vitamin D deficiency, unspecified: Secondary | ICD-10-CM | POA: Diagnosis not present

## 2019-03-26 DIAGNOSIS — R2242 Localized swelling, mass and lump, left lower limb: Secondary | ICD-10-CM | POA: Insufficient documentation

## 2019-03-26 DIAGNOSIS — M19071 Primary osteoarthritis, right ankle and foot: Secondary | ICD-10-CM | POA: Diagnosis not present

## 2019-03-26 DIAGNOSIS — Z0000 Encounter for general adult medical examination without abnormal findings: Secondary | ICD-10-CM | POA: Insufficient documentation

## 2019-03-26 DIAGNOSIS — Z780 Asymptomatic menopausal state: Secondary | ICD-10-CM | POA: Insufficient documentation

## 2019-03-26 LAB — LIPID PANEL
Cholesterol: 224 mg/dL — ABNORMAL HIGH (ref 0–200)
HDL: 73.3 mg/dL (ref >39.00)
LDL Cholesterol: 127 mg/dL — ABNORMAL HIGH (ref 0–99)
NonHDL: 150.37
Total CHOL/HDL Ratio: 3
Triglycerides: 115 mg/dL (ref 0.0–149.0)
VLDL: 23 mg/dL (ref 0.0–40.0)

## 2019-03-26 LAB — COMPREHENSIVE METABOLIC PANEL
ALT: 61 U/L — ABNORMAL HIGH (ref 0–35)
AST: 69 U/L — ABNORMAL HIGH (ref 0–37)
Albumin: 4.8 g/dL (ref 3.5–5.2)
Alkaline Phosphatase: 134 U/L — ABNORMAL HIGH (ref 39–117)
BUN: 13 mg/dL (ref 6–23)
CO2: 25 mEq/L (ref 19–32)
Calcium: 9.9 mg/dL (ref 8.4–10.5)
Chloride: 96 mEq/L (ref 96–112)
Creatinine, Ser: 0.55 mg/dL (ref 0.40–1.20)
GFR: 109.68 mL/min (ref >60.00)
Glucose, Bld: 94 mg/dL (ref 70–99)
Potassium: 4.5 mEq/L (ref 3.5–5.1)
Sodium: 133 mEq/L — ABNORMAL LOW (ref 135–145)
Total Bilirubin: 0.6 mg/dL (ref 0.2–1.2)
Total Protein: 8.2 g/dL (ref 6.0–8.3)

## 2019-03-26 LAB — CBC WITH DIFFERENTIAL/PLATELET
Basophils Absolute: 0 10*3/uL (ref 0.0–0.1)
Basophils Relative: 0.3 % (ref 0.0–3.0)
Eosinophils Absolute: 0.2 10*3/uL (ref 0.0–0.7)
Eosinophils Relative: 1.8 % (ref 0.0–5.0)
HCT: 41.7 % (ref 36.0–46.0)
Hemoglobin: 14.1 g/dL (ref 12.0–15.0)
Lymphocytes Relative: 25.8 % (ref 12.0–46.0)
Lymphs Abs: 2.7 10*3/uL (ref 0.7–4.0)
MCHC: 33.7 g/dL (ref 30.0–36.0)
MCV: 91.8 fl (ref 78.0–100.0)
Monocytes Absolute: 0.9 10*3/uL (ref 0.1–1.0)
Monocytes Relative: 8.7 % (ref 3.0–12.0)
Neutro Abs: 6.7 10*3/uL (ref 1.4–7.7)
Neutrophils Relative %: 63.4 % (ref 43.0–77.0)
Platelets: 327 10*3/uL (ref 150.0–400.0)
RBC: 4.54 Mil/uL (ref 3.87–5.11)
RDW: 13.7 % (ref 11.5–15.5)
WBC: 10.5 10*3/uL (ref 4.0–10.5)

## 2019-03-26 LAB — TSH: TSH: 2.74 u[IU]/mL (ref 0.35–4.50)

## 2019-03-26 LAB — VITAMIN D 25 HYDROXY (VIT D DEFICIENCY, FRACTURES): VITD: 41.78 ng/mL (ref 30.00–100.00)

## 2019-03-26 MED ORDER — NYSTATIN-TRIAMCINOLONE 100000-0.1 UNIT/GM-% EX OINT: 1.0000 "application " | TOPICAL_OINTMENT | Freq: Two times a day (BID) | CUTANEOUS | 0 refills | Status: DC

## 2019-03-26 NOTE — Patient Instructions (Addendum)
Great to see you! I will call you with your lab and xray results from today and you can view them online.    Keep a log of your blood pressures and let me know how you're doing- send them through my chart.  Try mycolog twice daily (wear a glove at night) for two weeks and let me know how this works.

## 2019-03-26 NOTE — Addendum Note (Signed)
Addended by: Darral Dash on: 03/26/2019 10:16 AM   Modules accepted: Orders

## 2019-03-26 NOTE — Assessment & Plan Note (Addendum)
On crestor 5 mg daily. Continue this dose and check labs today.  Orders Placed This Encounter  Procedures  . DG Foot Complete Left  . CBC with Differential/Platelet  . Comprehensive metabolic panel  . Lipid panel  . TSH  . Vitamin D (25 hydroxy)

## 2019-03-26 NOTE — Addendum Note (Signed)
Addended by: Lucille Passy on: 03/26/2019 12:33 PM   Modules accepted: Orders

## 2019-03-26 NOTE — Progress Notes (Addendum)
Subjective:   Patient ID: Christy Freeman, female    DOB: 06-Jan-1951, 68 y.o.   MRN: [id number]  Christy Freeman is a pleasant 68 y.o. year old female who presents to clinic today with Annual Exam (Pt has questions about the PNV-23.  Pt also wanted to ask about pap smear,  does she still get them.)  on 03/26/2019  HPI:  Health Maintenance  Topic Date Due  . PNA vac Low Risk Adult (2 of 2 - PPSV23) 06/05/2019 (Originally 03/31/2018)  . MAMMOGRAM  05/26/2019  . COLONOSCOPY  12/03/2019  . TETANUS/TDAP  08/09/2022  . INFLUENZA VACCINE  Completed  . DEXA SCAN  Completed  . Hepatitis C Screening  Completed   Has had no postmenopausal bleeding.  Osteopenia- DEXA 12/10/17.  Mammogram scheduled for 04/08/19.-  HTN- taking Metoprolol XL 100 mg daily, Norvasc 10 mg daily, and lisinopril 40 mg daily. She has not taken her blood pressure medications today which is why her blood pressure I elevated- normotensive at home.  BP Readings from Last 3 Encounters:  03/26/19 (!) 160/98  06/12/18 138/86  06/04/18 138/72  HLD- on crestor 5 mg daily.  Lab Results  Component Value Date   CHOL 250 (H) 08/24/2017   HDL 72.70 08/24/2017   LDLCALC 152 (H) 08/24/2017   LDLDIRECT 191.0 07/30/2014   TRIG 127.0 08/24/2017   CHOLHDL 3 08/24/2017   The 10-year ASCVD risk score Mikey Bussing DC Jr., et al., 2013) is: 15.7%   Values used to calculate the score:     Age: 68 years     Sex: Female     Is Non-Hispanic African American: No     Diabetic: No     Tobacco smoker: No     Systolic Blood Pressure: 191 mmHg     Is BP treated: Yes     HDL Cholesterol: 72.7 mg/dL     Total Cholesterol: 250 mg/dL  Right foot mass- on top of foot, growing in size over past 9 months.  Never red or warmth Hurts when she puts her shoes on but not when she is barefoot.  Depression- Effexor 150 XR daily.  Feels she is staying positive and doing well.  Depression screen [facility] 2/9 03/26/2019 10/09/2018 09/05/2018 09/25/2017 08/15/2017   Decreased Interest 0 0 0 0 0  Down, Depressed, Hopeless 1 0 2 0 0  PHQ - 2 Score 1 0 2 0 0  Altered sleeping 0 - 0 - -  Tired, decreased energy 1 - 0 - -  Change in appetite 1 - 0 - -  Feeling bad or failure about yourself  0 - 0 - -  Trouble concentrating 0 - 0 - -  Moving slowly or fidgety/restless 0 - 0 - -  Suicidal thoughts 0 - 0 - -  PHQ-9 Score 3 - 2 - -  Difficult doing work/chores Not difficult at all - Not difficult at all - -   GAD 7 : Generalized Anxiety Score 03/26/2019  Nervous, Anxious, on Edge 1  Control/stop worrying 1  Worry too much - different things 0  Trouble relaxing 0  Restless 0  Easily annoyed or irritable 0  Afraid - awful might happen 0  Total GAD 7 Score 2  Anxiety Difficulty Not difficult at all    Current Outpatient Medications on File Prior to Visit  Medication Sig Dispense Refill  . amLODipine (NORVASC) 10 MG tablet Take 1 tablet (10 mg total) by mouth daily. 90 tablet 2  . cetirizine (  ZYRTEC) 10 MG tablet Take 10 mg by mouth daily.    Marland Kitchen lisinopril (ZESTRIL) 40 MG tablet TAKE 1 TABLET BY MOUTH EVERY DAY 90 tablet 1  . metoprolol succinate (TOPROL-XL) 100 MG 24 hr tablet TAKE 1 TABLET BY MOUTH ONCE DAILY TAKE WITH OR IMMEADIATELY FOLLOWING A MEAL 90 tablet 2  . omeprazole (PRILOSEC) 10 MG capsule Take 1 capsule (10 mg total) by mouth daily. 90 capsule 2  . Probiotic Product (PROBIOTIC DAILY PO) Take 1 capsule by mouth as needed.     . rosuvastatin (CRESTOR) 5 MG tablet Take 1 tablet (5 mg total) by mouth at bedtime. 90 tablet 2  . venlafaxine XR (EFFEXOR-XR) 150 MG 24 hr capsule Take 1 capsule (150 mg total) by mouth daily. 90 capsule 2   No current facility-administered medications on file prior to visit.     No Known Allergies  Past Medical History:  Diagnosis Date  . Alcohol abuse   . Blood transfusion without reported diagnosis   . Cancer (HCC)    basil cell on face  . Colon polyps   . GERD (gastroesophageal reflux disease)   .  H/O: depression   . Hypercholesterolemia    diet controlled  . Hypertension   . Lung nodule    left lung   . Thyroid disease     Past Surgical History:  Procedure Laterality Date  . APPENDECTOMY  06/14/2017  . COLONOSCOPY    . CYST REMOVAL HAND Left   . DILATION AND CURETTAGE OF UTERUS    . LAPAROSCOPIC APPENDECTOMY N/A 06/14/2017   Procedure: APPENDECTOMY LAPAROSCOPIC;  Surgeon: Clayburn Pert, MD;  Location: [facility] ORS;  Service: General;  Laterality: N/A;    Family History  Problem Relation Age of Onset  . Diabetes Mother   . Heart failure Mother   . Hypertension Mother   . Stroke Father   . Hypertension Father   . Heart disease Brother   . Stroke Maternal Grandmother   . Colon cancer Neg Hx     Social History   Socioeconomic History  . Marital status: Married    Spouse name: Not on file  . Number of children: Not on file  . Years of education: Not on file  . Highest education level: Not on file  Occupational History  . Not on file  Social Needs  . Financial resource strain: Not on file  . Food insecurity    Worry: Not on file    Inability: Not on file  . Transportation needs    Medical: Not on file    Non-medical: Not on file  Tobacco Use  . Smoking status: Never Smoker  . Smokeless tobacco: Never Used  Substance and Sexual Activity  . Alcohol use: Yes    Alcohol/week: 14.0 standard drinks    Types: 6 Glasses of wine, 8 Standard drinks or equivalent per week    Comment: 2 glasses of wine per night  . Drug use: No  . Sexual activity: Yes  Lifestyle  . Physical activity    Days per week: Not on file    Minutes per session: Not on file  . Stress: Not on file  Relationships  . Social Herbalist on phone: Not on file    Gets together: Not on file    Attends religious service: Not on file    Active member of club or organization: Not on file    Attends meetings of clubs or organizations: Not on file  Relationship status: Not on file  .  Intimate partner violence    Fear of current or ex partner: Not on file    Emotionally abused: Not on file    Physically abused: Not on file    Forced sexual activity: Not on file  Other Topics Concern  . Not on file  Social History Narrative   She would desire CPR.   Does have a living will.   The PMH, PSH, Social History, Family History, Medications, and allergies have been reviewed in [facility], and have been updated if relevant.   Review of Systems  Constitutional: Negative.   HENT: Negative.   Eyes: Negative.   Respiratory: Negative.   Cardiovascular: Negative.   Gastrointestinal: Negative.   Endocrine: Negative.   Genitourinary: Negative.   Musculoskeletal: Positive for arthralgias.  Skin: Positive for rash.  Allergic/Immunologic: Negative.   Neurological: Negative.   Hematological: Negative.   Psychiatric/Behavioral: Negative.   All other systems reviewed and are negative.      Objective:    BP (!) 160/98 (BP Location: Left Arm, Patient Position: Sitting, Cuff Size: Normal)   Pulse 74   Temp 98.8 F (37.1 C) (Oral)   Ht 5' 4.5" (1.638 m)   Wt 193 lb 12.8 oz (87.9 kg)   SpO2 98%   BMI 32.75 kg/m    Physical Exam   General:  Well-developed,well-nourished,in no acute distress; alert,appropriate and cooperative throughout examination Head:  normocephalic and atraumatic.   Eyes:  vision grossly intact, PERRL Ears:  R ear normal and L ear normal externally, TMs clear bilaterally Nose:  no external deformity.   Mouth:  good dentition.   Neck:  No deformities, masses, or tenderness noted. Breasts:  No mass, nodules, thickening, tenderness, bulging, retraction, inflamation, nipple discharge or skin changes noted.   Lungs:  Normal respiratory effort, chest expands symmetrically. Lungs are clear to auscultation, no crackles or wheezes. Heart:  Normal rate and regular rhythm. S1 and S2 normal without gallop, murmur, click, rub or other extra sounds. Abdomen:  Bowel  sounds positive,abdomen soft and non-tender without masses, organomegaly or hernias noted. Msk:  No deformity or scoliosis noted of thoracic or lumbar spine.  Extremities:  No clubbing, cyanosis, edema, or deformity noted with normal full range of motion of all joints.  Firm, bony like mass on top of right foot, NTTP Neurologic:  alert & oriented X3 and gait normal.   Skin:  Dry path on base of left thumb, palmar surface. Cervical Nodes:  No lymphadenopathy noted Axillary Nodes:  No palpable lymphadenopathy Psych:  Cognition and judgment appear intact. Alert and cooperative with normal attention span and concentration. No apparent delusions, illusions, hallucinations       Assessment & Plan:   Hyperlipidemia, unspecified hyperlipidemia type  Aortic atherosclerosis ([place]), Chronic  Osteopenia after menopause  Current mild episode of major depressive disorder, unspecified whether recurrent ([place])  Essential hypertension  Vitamin D deficiency  Well woman exam without gynecological exam No follow-ups on file.

## 2019-03-26 NOTE — Assessment & Plan Note (Signed)
With high ascvd risk score. The 10-year ASCVD risk score Mikey Bussing DC Brooke Bonito., et al., 2013) is: 15.7%   Values used to calculate the score:     Age: 68 years     Sex: Female     Is Non-Hispanic African American: No     Diabetic: No     Tobacco smoker: No     Systolic Blood Pressure: 561 mmHg     Is BP treated: Yes     HDL Cholesterol: 72.7 mg/dL     Total Cholesterol: 250 mg/dL On crestor.  No changes made.

## 2019-03-26 NOTE — Assessment & Plan Note (Addendum)
Looks better today but it flares intermittently for years. Went to derm who gave her an antifungal which does not see seem to always help. eRx sent for mycolog- advised to use twice daily - see avs, for no longer than 2 weeks without an update. The patient indicates understanding of these issues and agrees with the plan.

## 2019-03-26 NOTE — Addendum Note (Signed)
Addended by: Lynnea Ferrier on: 03/26/2019 01:41 PM   Modules accepted: Orders

## 2019-03-26 NOTE — Assessment & Plan Note (Signed)
Has been well controlled- managed by Dr. Rockey Situ.  Has not had her blood pressure medication today. She will keep a log of her Bp at home and update me.

## 2019-03-26 NOTE — Assessment & Plan Note (Signed)
Given how hard it is on exam, will get foot xray today. ? Osteoarthritis.

## 2019-03-26 NOTE — Assessment & Plan Note (Signed)
Well controlled on current dose of Effexor. No changes made.

## 2019-03-26 NOTE — Addendum Note (Signed)
Addended by: Lynnea Ferrier on: 03/26/2019 10:19 AM   Modules accepted: Orders

## 2019-03-26 NOTE — Telephone Encounter (Signed)
Pt came into office today.

## 2019-03-26 NOTE — Assessment & Plan Note (Signed)
Due for DEXA next year. Continue good calcium and vitamin d intake in diet and weight bearing exercise.  Follow up bone density in 2 years.

## 2019-03-26 NOTE — Assessment & Plan Note (Signed)
Reviewed preventive care protocols, scheduled due services, and updated immunizations Discussed nutrition, exercise, diet, and healthy lifestyle.  prevnar 13 given to pt today. She is already scheduled for a mammogram next month.

## 2019-03-27 ENCOUNTER — Other Ambulatory Visit: Payer: Self-pay | Admitting: Family Medicine

## 2019-03-27 DIAGNOSIS — K760 Fatty (change of) liver, not elsewhere classified: Secondary | ICD-10-CM

## 2019-03-28 ENCOUNTER — Other Ambulatory Visit: Payer: Self-pay | Admitting: Family Medicine

## 2019-03-28 DIAGNOSIS — I10 Essential (primary) hypertension: Secondary | ICD-10-CM

## 2019-03-28 DIAGNOSIS — K2100 Gastro-esophageal reflux disease with esophagitis, without bleeding: Secondary | ICD-10-CM

## 2019-03-28 NOTE — Telephone Encounter (Signed)
Last fill 08/19/18  #90/2 Last OV 03/26/19

## 2019-04-02 ENCOUNTER — Other Ambulatory Visit: Payer: Self-pay

## 2019-04-02 ENCOUNTER — Ambulatory Visit
Admission: RE | Admit: 2019-04-02 | Discharge: 2019-04-02 | Disposition: A | Discharge status: Home / Self Care | Payer: PPO | Source: Ambulatory Visit | Attending: Family Medicine | Admitting: Family Medicine

## 2019-04-02 ENCOUNTER — Ambulatory Visit: Payer: PPO

## 2019-04-02 DIAGNOSIS — K760 Fatty (change of) liver, not elsewhere classified: Secondary | ICD-10-CM | POA: Diagnosis not present

## 2019-04-02 DIAGNOSIS — K8020 Calculus of gallbladder without cholecystitis without obstruction: Secondary | ICD-10-CM | POA: Diagnosis not present

## 2019-04-02 MED ORDER — IOHEXOL 300 MG/ML  SOLN
100.0000 mL | Freq: Once | INTRAMUSCULAR | Status: AC | PRN
  Administered 2019-04-02: 100 mL via INTRAVENOUS

## 2019-04-03 ENCOUNTER — Ambulatory Visit
Admission: RE | Admit: 2019-04-03 | Discharge: 2019-04-03 | Disposition: A | Discharge status: Home / Self Care | Payer: PPO | Source: Ambulatory Visit | Attending: Family Medicine | Admitting: Family Medicine

## 2019-04-03 DIAGNOSIS — Z1231 Encounter for screening mammogram for malignant neoplasm of breast: Secondary | ICD-10-CM | POA: Diagnosis not present

## 2019-04-08 ENCOUNTER — Ambulatory Visit: Payer: PPO

## 2019-05-07 DIAGNOSIS — L578 Other skin changes due to chronic exposure to nonionizing radiation: Secondary | ICD-10-CM | POA: Diagnosis not present

## 2019-05-07 DIAGNOSIS — Z872 Personal history of diseases of the skin and subcutaneous tissue: Secondary | ICD-10-CM | POA: Diagnosis not present

## 2019-05-07 DIAGNOSIS — L821 Other seborrheic keratosis: Secondary | ICD-10-CM | POA: Diagnosis not present

## 2019-05-19 ENCOUNTER — Ambulatory Visit: Payer: PPO

## 2019-05-19 ENCOUNTER — Other Ambulatory Visit: Payer: Self-pay

## 2019-05-19 DIAGNOSIS — I10 Essential (primary) hypertension: Secondary | ICD-10-CM

## 2019-05-19 MED ORDER — LISINOPRIL 40 MG PO TABS: 40.0000 mg | ORAL_TABLET | Freq: Every day | ORAL | 1 refills | Status: DC

## 2019-05-19 NOTE — Telephone Encounter (Signed)
Last OV 03/26/2019 Last fill 11/26/18  #90/1

## 2019-05-27 ENCOUNTER — Telehealth: Payer: Self-pay | Admitting: Family Medicine

## 2019-05-27 DIAGNOSIS — I10 Essential (primary) hypertension: Secondary | ICD-10-CM

## 2019-05-27 MED ORDER — OMEPRAZOLE 10 MG PO CPDR: 10.0000 mg | DELAYED_RELEASE_CAPSULE | Freq: Every day | ORAL | 2 refills | Status: DC

## 2019-05-27 MED ORDER — LISINOPRIL 40 MG PO TABS: 40.0000 mg | ORAL_TABLET | Freq: Every day | ORAL | 1 refills | Status: DC

## 2019-05-27 NOTE — Telephone Encounter (Signed)
Copied from [place]. Topic: General - Other >> May 27, 2019  2:16 PM Antonieta Iba C wrote: Reason for CRM: pt's called in for assistance. Pt was advised that pharmacy has not received Rx for lisinopril (ZESTRIL) 40 MG tablet, showing in chart that Rx was sent, please assist.

## 2019-05-27 NOTE — Telephone Encounter (Signed)
rx refill omeprazole (PRILOSEC) 10 MG capsule  Patient has no medication, patient is requesting a call when sent to [place], [place] Phone:  [phone]  Fax:  [phone]

## 2019-05-27 NOTE — Telephone Encounter (Signed)
Please see other encounter.

## 2019-05-27 NOTE — Telephone Encounter (Signed)
Medication sent to wrong pharmacy on 05/19/19 Last OV  03/26/19 Last fill 08/19/18  #90/2 for Omeprazole Last fill 05/19/19  #90/0  For Lisinopril

## 2019-06-02 ENCOUNTER — Encounter: Payer: Self-pay | Admitting: Family Medicine

## 2019-06-02 ENCOUNTER — Other Ambulatory Visit: Payer: Self-pay

## 2019-06-02 DIAGNOSIS — K2100 Gastro-esophageal reflux disease with esophagitis, without bleeding: Secondary | ICD-10-CM

## 2019-06-02 MED ORDER — OMEPRAZOLE 10 MG PO CPDR: 10.0000 mg | DELAYED_RELEASE_CAPSULE | Freq: Every day | ORAL | 2 refills | Status: DC

## 2019-06-23 ENCOUNTER — Other Ambulatory Visit: Payer: Self-pay

## 2019-06-23 NOTE — Telephone Encounter (Signed)
Last OV 03/26/19 Last fill 08/19/18  #90/2

## 2019-06-26 ENCOUNTER — Other Ambulatory Visit: Payer: Self-pay

## 2019-06-26 DIAGNOSIS — E785 Hyperlipidemia, unspecified: Secondary | ICD-10-CM

## 2019-06-26 MED ORDER — ROSUVASTATIN CALCIUM 5 MG PO TABS: 5.0000 mg | ORAL_TABLET | Freq: Every day | ORAL | 2 refills | Status: DC

## 2019-07-03 ENCOUNTER — Other Ambulatory Visit: Payer: Self-pay

## 2019-07-03 ENCOUNTER — Ambulatory Visit: Payer: PPO

## 2019-07-03 ENCOUNTER — Telehealth: Payer: Self-pay | Admitting: Family Medicine

## 2019-07-03 DIAGNOSIS — I10 Essential (primary) hypertension: Secondary | ICD-10-CM

## 2019-07-03 DIAGNOSIS — K2100 Gastro-esophageal reflux disease with esophagitis, without bleeding: Secondary | ICD-10-CM

## 2019-07-03 MED ORDER — AMLODIPINE BESYLATE 10 MG PO TABS: ORAL_TABLET | ORAL | 2 refills | Status: DC

## 2019-07-03 NOTE — Telephone Encounter (Signed)
Patient is calling and is requesting a refill for Amlodipine Besylate sent to [facility] on [address] in [place]. CB is [phone]

## 2019-07-03 NOTE — Progress Notes (Signed)
Rx sent to pharmacy/thx dmf

## 2019-07-08 ENCOUNTER — Other Ambulatory Visit: Payer: Self-pay

## 2019-07-08 MED ORDER — VENLAFAXINE HCL ER 150 MG PO CP24: 150.0000 mg | ORAL_CAPSULE | Freq: Every day | ORAL | 2 refills | Status: DC

## 2019-07-10 ENCOUNTER — Ambulatory Visit: Payer: PPO | Attending: Internal Medicine

## 2019-07-10 DIAGNOSIS — Z23 Encounter for immunization: Secondary | ICD-10-CM | POA: Insufficient documentation

## 2019-07-10 NOTE — Progress Notes (Signed)
   Covid-19 Vaccination Clinic  Name:  Christy Freeman    MRN: [id number] DOB: 10-24-1950  07/10/2019  [name] was observed post Covid-19 immunization for 15 minutes without incidence. She was provided with Vaccine Information Sheet and instruction to access the V-Safe system.   [name] was instructed to call 911 with any severe reactions post vaccine: Marland Kitchen Difficulty breathing  . Swelling of your face and throat  . A fast heartbeat  . A bad rash all over your body  . Dizziness and weakness    Immunizations Administered    Name Date Dose VIS Date Route   Pfizer COVID-19 Vaccine 07/10/2019  1:50 PM 0.3 mL 05/16/2019 Intramuscular   Manufacturer: [place]   Lot: [id number]   [place]: [id number]

## 2019-07-12 ENCOUNTER — Encounter: Payer: Self-pay | Admitting: Family Medicine

## 2019-07-24 ENCOUNTER — Ambulatory Visit: Payer: PPO

## 2019-07-25 ENCOUNTER — Telehealth: Payer: Self-pay | Admitting: Family Medicine

## 2019-07-25 NOTE — Telephone Encounter (Signed)
Yes

## 2019-07-25 NOTE — Telephone Encounter (Signed)
Pt wanted to Transfer of Care to you from dr Deborra Medina  Is this ok??

## 2019-07-28 ENCOUNTER — Telehealth: Payer: Self-pay | Admitting: General Practice

## 2019-07-28 NOTE — Telephone Encounter (Signed)
Patient is calling and requesting a call back from [place]. Pls advise. CB is [phone]

## 2019-07-29 NOTE — Telephone Encounter (Signed)
Spoke to pt/thx dmf

## 2019-07-29 NOTE — Telephone Encounter (Signed)
Appointment 3/12

## 2019-08-04 ENCOUNTER — Ambulatory Visit: Payer: PPO

## 2019-08-13 ENCOUNTER — Telehealth: Payer: Self-pay | Admitting: Family Medicine

## 2019-08-13 NOTE — Telephone Encounter (Signed)
Patient called about her appointment on Friday for TOC from [place].  Patient wanted to change her appointment to a virtual visit.  Patient said she had her second covid vaccine, but the two weeks won't be until after her appointment.  Patient would be more comfortable doing a virtual visit and she does have a blood pressure monitor to check her blood pressure.Can patient do a virtual visit?

## 2019-08-13 NOTE — Telephone Encounter (Signed)
Patient notified and she said that's fine.

## 2019-08-13 NOTE — Telephone Encounter (Signed)
Yes, okay to change to virtual but make sure pt all issues may not be able to be addressed.. may need later follow up appt in person if need determined.

## 2019-08-15 ENCOUNTER — Encounter: Payer: Self-pay | Admitting: Family Medicine

## 2019-08-15 ENCOUNTER — Ambulatory Visit (INDEPENDENT_AMBULATORY_CARE_PROVIDER_SITE_OTHER): Payer: PPO | Admitting: Family Medicine

## 2019-08-15 VITALS — BP 142/72 | HR 72 | Temp 97.9°F | Wt 194.0 lb

## 2019-08-15 DIAGNOSIS — E785 Hyperlipidemia, unspecified: Secondary | ICD-10-CM | POA: Diagnosis not present

## 2019-08-15 DIAGNOSIS — E559 Vitamin D deficiency, unspecified: Secondary | ICD-10-CM

## 2019-08-15 DIAGNOSIS — K2100 Gastro-esophageal reflux disease with esophagitis, without bleeding: Secondary | ICD-10-CM

## 2019-08-15 DIAGNOSIS — R911 Solitary pulmonary nodule: Secondary | ICD-10-CM | POA: Diagnosis not present

## 2019-08-15 DIAGNOSIS — F321 Major depressive disorder, single episode, moderate: Secondary | ICD-10-CM | POA: Diagnosis not present

## 2019-08-15 DIAGNOSIS — I700 Atherosclerosis of aorta: Secondary | ICD-10-CM

## 2019-08-15 DIAGNOSIS — I10 Essential (primary) hypertension: Secondary | ICD-10-CM

## 2019-08-15 DIAGNOSIS — K7581 Nonalcoholic steatohepatitis (NASH): Secondary | ICD-10-CM

## 2019-08-15 NOTE — Assessment & Plan Note (Signed)
Well controlled on OTC supplement.

## 2019-08-15 NOTE — Assessment & Plan Note (Signed)
Well controlled. Continue current medication.  

## 2019-08-15 NOTE — Assessment & Plan Note (Signed)
Using omeprazole 10 mg daily with no symptoms.

## 2019-08-15 NOTE — Assessment & Plan Note (Signed)
Stable control on crestor Lab Results  Component Value Date   CHOL 224 (H) 03/26/2019   HDL 73.30 03/26/2019   LDLCALC 127 (H) 03/26/2019   LDLDIRECT 191.0 07/30/2014   TRIG 115.0 03/26/2019   CHOLHDL 3 03/26/2019

## 2019-08-15 NOTE — Progress Notes (Signed)
Virtual Visit via Video Note  I connected with Christy Freeman on 08/15/19 at  9:40 AM EST by a video enabled telemedicine application and verified that I am speaking with the correct person using two identifiers.  Location: Patient: home Provider: office   I discussed the limitations of evaluation and management by telemedicine and the availability of in person appointments. The patient expressed understanding and agreed to proceed.     Chief Complaint  Patient presents with  . Establish Care    xfer from Dr. Deborra Medina  . Medication Refill    History of Present Illness: HPI  70 year old female presents for transfer of care from Dr. Deborra Medina.   Last CPX: 03/2019 Reviewed in detail.  In process of getting COVID vaccines.  Steatohepatitis: Dx 10 years ago. Now improving with improved diet, only occ exercise. CT liver 04/02/2019  Mild enlargement and fatty liver.   09/11/2013 CT chest (cardiac calcium score protocol) . Limited views of the lungs are available. The lung tissue is normal the exception of a 9 mm nodule in the left upper lobe. This is subpleural, round, and homogenous in texture 01/2015 CT chest > stable nodule, rec f/u in 6-12 months for final image 2019: IMPRESSION: Stable irregular left subpleural nodule is noted; given the lack of change since 2016, this can be considered benign at this point with no further follow-up required.  Hx of frequent pneumonia over past several years.  Elevated Cholesterol: On crestor 5 mg daily.  10 year CVD risk 15.7% in 03/2019 Using medications without problems:none Muscle aches: none Diet compliance: healthy Exercise: good Other complaints: aortic aterosclerosis  GERD: stable on low dose omepraole.  Solitary pulm nodule: 09/11/2013 CT chest (cardiac calcium score protocol) . Limited views of the lungs are available. The lung tissue is normal the exception of a 9 mm nodule in the left upper lobe. This is subpleural, round, and  homogenous in texture 01/2015 CT chest > stable nodule, rec f/u in 6-12 months for final image 2019: IMPRESSION: Stable irregular left subpleural nodule is noted; given the lack of change since 2016, this can be considered benign at this point with no further follow-up required.   Hypertension: Taking Metoprolol XL 100 mg daily, Norvasc 10 mg daily, and lisinopril 40 mg daily. BP Readings from Last 3 Encounters:  08/15/19 (!) 142/72  03/26/19 (!) 160/98  06/12/18 138/86  Using medication without problems or lightheadedness: none Chest pain with exertion:none Edema:none Short of breath:none Average home BPs: Other issues: Followed by Dr. Rockey Situ.  MDD on Effexor 150 mg XR daily.     COVID 19 screen:  No recent travel or known exposure to COVID19 The patient denies respiratory symptoms of COVID 19 at this time. The importance of social distancing was discussed today.     Review of Systems  Constitutional: Negative for chills and fever.  HENT: Negative for congestion and ear pain.   Eyes: Negative for pain and redness.  Respiratory: Negative for cough and shortness of breath.   Cardiovascular: Negative for chest pain, palpitations and leg swelling.  Gastrointestinal: Negative for abdominal pain, blood in stool, constipation, diarrhea, nausea and vomiting.  Genitourinary: Negative for dysuria.  Musculoskeletal: Negative for falls and myalgias.  Skin: Negative for rash.  Neurological: Negative for dizziness.  Psychiatric/Behavioral: Negative for depression. The patient is not nervous/anxious.       Past Medical History:  Diagnosis Date  . Alcohol abuse   . Blood transfusion without reported diagnosis   .  Cancer (HCC)    basil cell on face  . Colon polyps   . GERD (gastroesophageal reflux disease)   . H/O: depression   . Hypercholesterolemia    diet controlled  . Hypertension   . Lung nodule    left lung   . Thyroid disease     reports that she has never smoked.  She has never used smokeless tobacco. She reports current alcohol use of about 14.0 standard drinks of alcohol per week. She reports that she does not use drugs.   Current Outpatient Medications:  .  amLODipine (NORVASC) 10 MG tablet, TAKE 1 TABLET(10 MG) BY MOUTH DAILY, Disp: 90 tablet, Rfl: 2 .  cetirizine (ZYRTEC) 10 MG tablet, Take 10 mg by mouth daily., Disp: , Rfl:  .  lisinopril (ZESTRIL) 40 MG tablet, Take 1 tablet (40 mg total) by mouth daily., Disp: 90 tablet, Rfl: 1 .  metoprolol succinate (TOPROL-XL) 100 MG 24 hr tablet, TAKE 1 TABLET BY MOUTH ONCE DAILY TAKE WITH OR IMMEADIATELY FOLLOWING A MEAL, Disp: 90 tablet, Rfl: 2 .  omeprazole (PRILOSEC) 10 MG capsule, Take 1 capsule (10 mg total) by mouth daily., Disp: 90 capsule, Rfl: 2 .  Probiotic Product (PROBIOTIC DAILY PO), Take 1 capsule by mouth daily. , Disp: , Rfl:  .  rosuvastatin (CRESTOR) 5 MG tablet, Take 1 tablet (5 mg total) by mouth at bedtime., Disp: 90 tablet, Rfl: 2 .  venlafaxine XR (EFFEXOR-XR) 150 MG 24 hr capsule, Take 1 capsule (150 mg total) by mouth daily., Disp: 90 capsule, Rfl: 2   Observations/Objective: Blood pressure (!) 142/72, pulse 72, temperature 97.9 F (36.6 C), weight 194 lb (88 kg), SpO2 97 %.  Physical Exam  Physical Exam Constitutional:      General: The patient is not in acute distress. Pulmonary:     Effort: Pulmonary effort is normal. No respiratory distress.  Neurological:     Mental Status: The patient is alert and oriented to person, place, and time.  Psychiatric:        Mood and Affect: Mood normal.        Behavior: Behavior normal.   Assessment and Plan   Solitary pulmonary nodule Stable over 2015-12019 Chest CTs no further eval needed.  Vitamin D deficiency  Well controlled on OTC supplement.  Steatohepatitis  Working on low fat diet, decrease  ETOH... working on exercise.  Liver CT 2020.. showed fatty liver and mild enlargement.  MDD (major depressive disorder), single  episode, moderate (HCC)  Good control on Effexor. Start after mother passed away. Considering weaning off.  Hypertension Well controlled. Continue current medication.   Hyperlipidemia  Stable control on crestor Lab Results  Component Value Date   CHOL 224 (H) 03/26/2019   HDL 73.30 03/26/2019   LDLCALC 127 (H) 03/26/2019   LDLDIRECT 191.0 07/30/2014   TRIG 115.0 03/26/2019   CHOLHDL 3 03/26/2019     GERD (gastroesophageal reflux disease) Using omeprazole 10 mg daily with no symptoms.  Aortic atherosclerosis Work on low cholesterol diet, on statin. May need tighter cholesterol control for risk factor reduction.   PT WILL CALL TO MAKE FOLLOW UP FOR CPX/AMW IN 03/2020.   Eliezer Lofts, MD

## 2019-08-15 NOTE — Assessment & Plan Note (Signed)
Working on low fat diet, decrease  ETOH... working on exercise.  Liver CT 2020.. showed fatty liver and mild enlargement.

## 2019-08-15 NOTE — Assessment & Plan Note (Signed)
Good control on Effexor. Start after mother passed away. Considering weaning off.

## 2019-08-15 NOTE — Assessment & Plan Note (Signed)
Stable over 2015-12019 Chest CTs no further eval needed.

## 2019-08-15 NOTE — Assessment & Plan Note (Addendum)
Work on low cholesterol diet, on statin. May need tighter cholesterol control for risk factor reduction.

## 2019-08-26 DIAGNOSIS — L309 Dermatitis, unspecified: Secondary | ICD-10-CM | POA: Diagnosis not present

## 2019-09-29 ENCOUNTER — Other Ambulatory Visit: Payer: Self-pay | Admitting: *Deleted

## 2019-09-29 DIAGNOSIS — I10 Essential (primary) hypertension: Secondary | ICD-10-CM

## 2019-09-29 MED ORDER — METOPROLOL SUCCINATE ER 100 MG PO TB24: ORAL_TABLET | ORAL | 3 refills | Status: DC

## 2019-10-13 NOTE — Progress Notes (Deleted)
Date:  10/13/2019 home ID:  Christy Freeman, Christy Freeman Apr 01, 1951, MRN [id number]  Patient Location:  [place] [address]   Provider location:   [facility], [place] office  PCP:  Christy Sanders, MD  Cardiologist:  [facility]   No chief complaint on file.   History of Present Illness:    Christy Freeman is a 69 y.o. female past medical history of fatty liver  LDL 190-200 mg/dL,  Previous evaluation in the ER following chest pains, which were ultimately felt to be due to noncardiac chest pain, poorly controlled blood pressure,  Stress test negative.  Minimal aortic atherosclerosis on prior CT scan She presents for evaluation of hyperlipidemia, aortic atherosclerosis  Reports blood pressure has been relatively well controlled, Compliant with her blood pressure pills 143/82, before pills this AM 135/78 yesterday  Has appt with PMD this month for lab work Reports she has been compliant with her Crestor  Feels well in general, not doing yoga anymore but is active in the backyard and with family  Recent UTI Had a fever Better with medication  Denies any chest pain concerning for angina No shortness of breath on exertion Denies any side effects from her medications  Other past medical history reviewed  CT scan chest showed no coronary calcifications, minimal descending aorta calcification, none in the proximal carotid arteries.  Family history: Mother (MI at 56 y.o. ; Diabetes at age 11 y.o.). Father (TIA at 36 y.o.); Brother (MI at 86 y.o.) Social History: Drinks wine 4-6 glasses per week Previously drank 4 nights per week. She stopped 2009-2011 after seeing fatty liver results, but restarted in 2011 after stress levels increased. No tobacco use.   Liver history: Patient diagnosed with fatty liver on U/S 06/2007 by Dr. [facility], [place]). Her LFTs went all the way to normal she tells me once she stopped drinking  alcohol in 2009 - then went up after restating alcohol. He checks LFTs annually. ALT/AST were in the 90's in 03/06/13, then improved to 40's late 03/24/13 after she reduced her alcohol consumption from 4 days per week down to 2 nights per week (2-3 glasses per day).    Prior CV studies:   The following studies were reviewed today:    Past Medical History:  Diagnosis Date  . Blood transfusion without reported diagnosis   . Cancer ([place])    basal cell on face  . Colon polyps   . GERD (gastroesophageal reflux disease)   . H/O: depression   . Hypercholesterolemia    diet controlled  . Hypertension   . Lung nodule    left lung    Past Surgical History:  Procedure Laterality Date  . APPENDECTOMY  06/14/2017  . COLONOSCOPY    . CYST REMOVAL HAND Left   . DILATION AND CURETTAGE OF UTERUS    . LAPAROSCOPIC APPENDECTOMY N/A 06/14/2017   Procedure: APPENDECTOMY LAPAROSCOPIC;  Surgeon: Christy Pert, MD;  Location: [facility] ORS;  Service: General;  Laterality: N/A;     No outpatient medications have been marked as taking for the 10/14/19 encounter (Appointment) with Christy Merritts, MD.     Allergies:   Patient has no known allergies.   Social History   Tobacco Use  . Smoking status: Never Smoker  . Smokeless tobacco: Never Used  Substance Use Topics  . Alcohol use: Yes    Alcohol/week: 14.0 standard drinks    Types: 6 Glasses of wine,  8 Standard drinks or equivalent per week    Comment: 2 glasses of wine per night  . Drug use: No     Current Outpatient Medications on File Prior to Visit  Medication Sig Dispense Refill  . amLODipine (NORVASC) 10 MG tablet TAKE 1 TABLET(10 MG) BY MOUTH DAILY 90 tablet 2  . cetirizine (ZYRTEC) 10 MG tablet Take 10 mg by mouth daily.    . clobetasol ointment (TEMOVATE) 0.05 %     . lisinopril (ZESTRIL) 40 MG tablet Take 1 tablet (40 mg total) by mouth daily. 90 tablet 1  . metoprolol succinate (TOPROL-XL) 100 MG 24 hr tablet TAKE 1 TABLET BY  MOUTH ONCE DAILY TAKE WITH OR IMMEADIATELY FOLLOWING A MEAL 90 tablet 3  . omeprazole (PRILOSEC) 10 MG capsule Take 1 capsule (10 mg total) by mouth daily. 90 capsule 2  . Probiotic Product (PROBIOTIC DAILY PO) Take 1 capsule by mouth daily.     . rosuvastatin (CRESTOR) 5 MG tablet Take 1 tablet (5 mg total) by mouth at bedtime. 90 tablet 2  . venlafaxine XR (EFFEXOR-XR) 150 MG 24 hr capsule Take 1 capsule (150 mg total) by mouth daily. 90 capsule 2   No current facility-administered medications on file prior to visit.     Family Hx: The patient's family history includes Diabetes in her mother; Heart disease in her brother; Heart failure in her mother; Hypertension in her father and mother; Stroke in her father and maternal grandmother. There is no history of Colon cancer.  ROS:   Please see the history of present illness.    Review of Systems  Constitutional: Negative.   HENT: Negative.   Respiratory: Negative.   Cardiovascular: Negative.   Gastrointestinal: Negative.   Musculoskeletal: Negative.   Neurological: Negative.   Psychiatric/Behavioral: Negative.   All other systems reviewed and are negative.     Labs/Other Tests and Data Reviewed:    Recent Labs: 03/26/2019: ALT 61; BUN 13; Creatinine, Ser 0.55; Hemoglobin 14.1; Platelets 327.0; Potassium 4.5; Sodium 133; TSH 2.74   Recent Lipid Panel Lab Results  Component Value Date/Time   CHOL 224 (H) 03/26/2019 10:18 AM   CHOL 258 (H) 01/28/2015 08:52 AM   TRIG 115.0 03/26/2019 10:18 AM   HDL 73.30 03/26/2019 10:18 AM   HDL 68 01/28/2015 08:52 AM   CHOLHDL 3 03/26/2019 10:18 AM   LDLCALC 127 (H) 03/26/2019 10:18 AM   LDLCALC 154 (H) 01/28/2015 08:52 AM   LDLDIRECT 191.0 07/30/2014 02:01 PM    Wt Readings from Last 3 Encounters:  08/15/19 194 lb (88 kg)  03/26/19 193 lb 12.8 oz (87.9 kg)  12/03/18 186 lb (84.4 kg)     Exam:    Vital Signs: Vital signs may also be detailed in the HPI There were no vitals taken for  this visit.  Wt Readings from Last 3 Encounters:  08/15/19 194 lb (88 kg)  03/26/19 193 lb 12.8 oz (87.9 kg)  12/03/18 186 lb (84.4 kg)   Temp Readings from Last 3 Encounters:  08/15/19 97.9 F (36.6 C)  03/26/19 98.8 F (37.1 C) (Oral)  09/05/18 98.2 F (36.8 C) (Oral)   BP Readings from Last 3 Encounters:  08/15/19 (!) 142/72  03/26/19 (!) 160/98  06/12/18 138/86   Pulse Readings from Last 3 Encounters:  08/15/19 72  03/26/19 74  06/12/18 72    130s over 80, pulse 70s, respirations 16  Well nourished, well developed female in no acute distress. Constitutional:  oriented to  person, place, and time. No distress.  Head: Normocephalic and atraumatic.  Eyes:  no discharge. No scleral icterus.  Neck: Normal range of motion. Neck supple.  Pulmonary/Chest: No audible wheezing, no distress, appears comfortable Musculoskeletal: Normal range of motion.  no  tenderness or deformity.  Neurological:   Coordination normal. Full exam not performed Skin:  No rash Psychiatric:  normal mood and affect. behavior is normal. Thought content normal.    ASSESSMENT & PLAN:    Aortic atherosclerosis (HCC) Mild disease, treating her cholesterol Tolerating Crestor Was previously on Zetia, may need to add this back  Essential hypertension Blood pressure is well controlled on today's visit. No changes made to the medications.  Gastroesophageal reflux disease with esophagitis Stable symptoms  Hyperlipidemia, unspecified hyperlipidemia type On Crestor 5 daily Has lab work with primary care If numbers still elevated may need to add Zetia  Obesity We have encouraged continued exercise, careful diet management in an effort to lose weight.    COVID-19 Education: The signs and symptoms of COVID-19 were discussed with the patient and how to seek care for testing (follow up with PCP or arrange E-visit).  The importance of social distancing was discussed today.  Patient Risk:   After full  review of this patients clinical status, I feel that they are at least moderate risk at this time.  Time:   Today, I have spent 25 minutes with the patient with telehealth technology discussing the cardiac and medical problems/diagnoses detailed above   10 min spent reviewing the chart prior to patient visit today   Medication Adjustments/Labs and Tests Ordered: Current medicines are reviewed at length with the patient today.  Concerns regarding medicines are outlined above.   Tests Ordered: No tests ordered   Medication Changes: No changes made   Disposition: Follow-up in 12 months   Signed, Ida Rogue, MD  10/13/2019 9:17 PM    [facility] [address], [place], [place] [address]

## 2019-10-14 ENCOUNTER — Ambulatory Visit: Payer: PPO | Admitting: Cardiovascular Disease

## 2019-10-15 ENCOUNTER — Ambulatory Visit: Payer: PPO | Admitting: *Deleted

## 2019-10-22 ENCOUNTER — Encounter: Payer: Self-pay | Admitting: Cardiovascular Disease

## 2019-10-22 ENCOUNTER — Ambulatory Visit: Payer: PPO | Admitting: Family

## 2019-10-22 ENCOUNTER — Ambulatory Visit (INDEPENDENT_AMBULATORY_CARE_PROVIDER_SITE_OTHER): Payer: PPO | Admitting: Cardiovascular Disease

## 2019-10-22 ENCOUNTER — Other Ambulatory Visit: Payer: Self-pay

## 2019-10-22 VITALS — BP 160/82 | HR 88 | Ht 63.0 in | Wt 193.4 lb

## 2019-10-22 DIAGNOSIS — I10 Essential (primary) hypertension: Secondary | ICD-10-CM | POA: Diagnosis not present

## 2019-10-22 DIAGNOSIS — I700 Atherosclerosis of aorta: Secondary | ICD-10-CM | POA: Diagnosis not present

## 2019-10-22 DIAGNOSIS — I2510 Atherosclerotic heart disease of native coronary artery without angina pectoris: Secondary | ICD-10-CM | POA: Diagnosis not present

## 2019-10-22 DIAGNOSIS — K760 Fatty (change of) liver, not elsewhere classified: Secondary | ICD-10-CM | POA: Diagnosis not present

## 2019-10-22 DIAGNOSIS — I872 Venous insufficiency (chronic) (peripheral): Secondary | ICD-10-CM

## 2019-10-22 DIAGNOSIS — E785 Hyperlipidemia, unspecified: Secondary | ICD-10-CM | POA: Diagnosis not present

## 2019-10-22 MED ORDER — ROSUVASTATIN CALCIUM 10 MG PO TABS: 10.0000 mg | ORAL_TABLET | Freq: Every day | ORAL | 3 refills | Status: DC

## 2019-10-22 MED ORDER — EZETIMIBE 10 MG PO TABS: 10.0000 mg | ORAL_TABLET | Freq: Every day | ORAL | 3 refills | Status: DC

## 2019-10-22 MED ORDER — HYDROCHLOROTHIAZIDE 25 MG PO TABS: 25.0000 mg | ORAL_TABLET | Freq: Every day | ORAL | 3 refills | Status: DC | PRN

## 2019-10-22 NOTE — Progress Notes (Signed)
Date:  10/22/2019 home ID:  Christy, Freeman 1951/02/25, MRN [id number]  Patient Location:  [place] [id number]   Provider location:   [facility], [place] office  PCP:  Jinny Sanders, MD  Cardiologist:  Patsy [place]   Chief Complaint  Patient presents with  . OTHER    12 month f/u discuss lower extremity circulation. Meds reviewed verbally with pt.    History of Present Illness:    Christy Freeman is a 70 y.o. female past medical history of fatty liver  LDL 190-200 mg/dL,  Previous evaluation in the ER following chest pains, which were ultimately felt to be due to noncardiac chest pain, poorly controlled blood pressure,  Stress test negative.   aortic atherosclerosis , mild coronary calcification on prior CT scan She presents for evaluation of hyperlipidemia, aortic atherosclerosis  In general reports feeling well No regular exercise program   Lab work reviewed in detail Sodium 133 alk phos 134, AST 69 ALT 61 Total cholesterol 224 LDL 127 Total cholesterol down from 250 in March 2019  CT ABD 03/2019 1. Mildly enlarged and steatotic liver. 2. Cholelithiasis. 3.  Aortic atherosclerosis   Reports blood pressure has been relatively well controlled, Compliant with her blood pressure pills  Having issues with lower extremity swelling Has been on her feet planning and performing wedding Concerned it might progress Would like evaluation by vascular surgery for venous incompetence  No significant chest pain or shortness of breath, no PND orthopnea Weight elevated but stable  EKG personally reviewed by myself on todays visit Shows normal sinus rhythm rate 88 bpm no significant ST-T wave changes  Other past medical history reviewed  CT scan chest showed no coronary calcifications, minimal descending aorta calcification, none in the proximal carotid arteries.  Family history: Mother (MI at 48 y.o. ; Diabetes at  age 45 y.o.). Father (TIA at 55 y.o.); Brother (MI at 37 y.o.) Social History: Drinks wine 4-6 glasses per week Previously drank 4 nights per week. She stopped 2009-2011 after seeing fatty liver results, but restarted in 2011 after stress levels increased. No tobacco use.   Liver history: Patient diagnosed with fatty liver on U/S 06/2007 by Dr. Gaye Pollack [facility], [place]). Her LFTs went all the way to normal she tells me once she stopped drinking alcohol in 2009 - then went up after restating alcohol. He checks LFTs annually. ALT/AST were in the 90's in 03/06/13, then improved to 40's late 03/24/13 after she reduced her alcohol consumption from 4 days per week down to 2 nights per week (2-3 glasses per day).    Prior CV studies:   The following studies were reviewed today:    Past Medical History:  Diagnosis Date  . Blood transfusion without reported diagnosis   . Cancer ([place])    basal cell on face  . Colon polyps   . GERD (gastroesophageal reflux disease)   . H/O: depression   . Hypercholesterolemia    diet controlled  . Hypertension   . Lung nodule    left lung    Past Surgical History:  Procedure Laterality Date  . APPENDECTOMY  06/14/2017  . COLONOSCOPY    . CYST REMOVAL HAND Left   . DILATION AND CURETTAGE OF UTERUS    . LAPAROSCOPIC APPENDECTOMY N/A 06/14/2017   Procedure: APPENDECTOMY LAPAROSCOPIC;  Surgeon: Clayburn Pert, MD;  Location: [facility] ORS;  Service: General;  Laterality: N/A;     Current  Meds  Medication Sig  . amLODipine (NORVASC) 10 MG tablet TAKE 1 TABLET(10 MG) BY MOUTH DAILY  . cetirizine (ZYRTEC) 10 MG tablet Take 10 mg by mouth daily.  . clobetasol ointment (TEMOVATE) 0.05 %   . lisinopril (ZESTRIL) 40 MG tablet Take 1 tablet (40 mg total) by mouth daily.  . metoprolol succinate (TOPROL-XL) 100 MG 24 hr tablet TAKE 1 TABLET BY MOUTH ONCE DAILY TAKE WITH OR IMMEADIATELY FOLLOWING A MEAL  . omeprazole (PRILOSEC) 10 MG capsule Take 1 capsule (10 mg  total) by mouth daily.  . Probiotic Product (PROBIOTIC DAILY PO) Take 1 capsule by mouth daily.   Marland Kitchen venlafaxine XR (EFFEXOR-XR) 150 MG 24 hr capsule Take 1 capsule (150 mg total) by mouth daily.  . [DISCONTINUED] rosuvastatin (CRESTOR) 5 MG tablet Take 1 tablet (5 mg total) by mouth at bedtime.     Allergies:   Patient has no known allergies.   Social History   Tobacco Use  . Smoking status: Never Smoker  . Smokeless tobacco: Never Used  Substance Use Topics  . Alcohol use: Yes    Alcohol/week: 14.0 standard drinks    Types: 6 Glasses of wine, 8 Standard drinks or equivalent per week    Comment: 2 glasses of wine per night  . Drug use: No     Current Outpatient Medications on File Prior to Visit  Medication Sig Dispense Refill  . amLODipine (NORVASC) 10 MG tablet TAKE 1 TABLET(10 MG) BY MOUTH DAILY 90 tablet 2  . cetirizine (ZYRTEC) 10 MG tablet Take 10 mg by mouth daily.    . clobetasol ointment (TEMOVATE) 0.05 %     . lisinopril (ZESTRIL) 40 MG tablet Take 1 tablet (40 mg total) by mouth daily. 90 tablet 1  . metoprolol succinate (TOPROL-XL) 100 MG 24 hr tablet TAKE 1 TABLET BY MOUTH ONCE DAILY TAKE WITH OR IMMEADIATELY FOLLOWING A MEAL 90 tablet 3  . omeprazole (PRILOSEC) 10 MG capsule Take 1 capsule (10 mg total) by mouth daily. 90 capsule 2  . Probiotic Product (PROBIOTIC DAILY PO) Take 1 capsule by mouth daily.     Marland Kitchen venlafaxine XR (EFFEXOR-XR) 150 MG 24 hr capsule Take 1 capsule (150 mg total) by mouth daily. 90 capsule 2   No current facility-administered medications on file prior to visit.     Family Hx: The patient's family history includes Diabetes in her mother; Heart disease in her brother; Heart failure in her mother; Hypertension in her father and mother; Stroke in her father and maternal grandmother. There is no history of Colon cancer.  ROS:   Please see the history of present illness.    Review of Systems  Constitutional: Negative.   HENT: Negative.    Respiratory: Negative.   Cardiovascular: Negative.   Gastrointestinal: Negative.   Musculoskeletal: Negative.   Neurological: Negative.   Psychiatric/Behavioral: Negative.   All other systems reviewed and are negative.    Labs/Other Tests and Data Reviewed:    Recent Labs: 03/26/2019: ALT 61; BUN 13; Creatinine, Ser 0.55; Hemoglobin 14.1; Platelets 327.0; Potassium 4.5; Sodium 133; TSH 2.74   Recent Lipid Panel Lab Results  Component Value Date/Time   CHOL 224 (H) 03/26/2019 10:18 AM   CHOL 258 (H) 01/28/2015 08:52 AM   TRIG 115.0 03/26/2019 10:18 AM   HDL 73.30 03/26/2019 10:18 AM   HDL 68 01/28/2015 08:52 AM   CHOLHDL 3 03/26/2019 10:18 AM   LDLCALC 127 (H) 03/26/2019 10:18 AM   LDLCALC 154 (H)  01/28/2015 08:52 AM   LDLDIRECT 191.0 07/30/2014 02:01 PM    Wt Readings from Last 3 Encounters:  10/22/19 193 lb 6 oz (87.7 kg)  08/15/19 194 lb (88 kg)  03/26/19 193 lb 12.8 oz (87.9 kg)     Exam:    Vital Signs: Vital signs may also be detailed in the HPI BP (!) 160/82 (BP Location: Left Arm, Patient Position: Sitting, Cuff Size: Normal)   Pulse 88   Ht 5' 3"  (1.6 m)   Wt 193 lb 6 oz (87.7 kg)   SpO2 98%   BMI 34.25 kg/m    Constitutional:  oriented to person, place, and time. No distress.  HENT:  Head: Grossly normal Eyes:  no discharge. No scleral icterus.  Neck: No JVD, no carotid bruits  Cardiovascular: Regular rate and rhythm, no murmurs appreciated Pulmonary/Chest: Clear to auscultation bilaterally, no wheezes or rails Abdominal: Soft.  no distension.  no tenderness.  Musculoskeletal: Normal range of motion Neurological:  normal muscle tone. Coordination normal. No atrophy Skin: Skin warm and dry Psychiatric: normal affect, pleasant   ASSESSMENT & PLAN:    Aortic atherosclerosis (HCC) Mild to moderate distal aortic atherosclerosis  Images pulled up and reviewed Tolerating Crestor Well increase 5 mg up to 10 mg add Zetia 10 mg daily Goal LDL less  than 70  Essential hypertension Blood pressure running high Rushing into the office, recommended she start monitoring closely at home We will use HCTZ very sparingly for leg edema and high pressure  Hyponatremia Chronically borderline low, we will not use HCTZ on a regular basis Avoid excessive free water Numbers discussed with her  Gastroesophageal reflux disease with esophagitis Stable symptoms  Hyperlipidemia, unspecified hyperlipidemia type Crestor up to 20, add Zetia  Obesity Recommend regular walking program  Fatty liver/NASH Discussed, recommended walking program, low carbohydrates  Leg swelling Likely dependent edema in the setting of heat, being on her feet Recommend compression hose Very sparingly could take HCTZ 12.5 up to 25 mg once or twice a week if needed Would need to monitor sodium closely given chronic hyponatremia   Disposition: Follow-up in 12 months   Total encounter time more than 45 minutes  Greater than 50% was spent in counseling and coordination of care with the patient    Signed, Ida Rogue, MD  10/22/2019 1:12 PM    [place] [facility] [id number], [place], [place] [address]

## 2019-10-22 NOTE — Patient Instructions (Addendum)
Referral to vein and vascular for venous insufficiency [facility]  [phone]  Low carbs, less carbs   Medication Instructions:  Your physician has recommended you make the following change in your medication:  1. INCREASE Rosuvastatin (Crestor) up to 10 mg daily 2. START Ezetimibe (Zetia) 10 mg daily in 3-4 weeks.  3. AS NEEDED Hydrochlorothiazide (HCTZ) 25 mg daily as needed for swelling or elevated blood pressures.   Make sure to check Blood Pressures 2 hours after your medications. Keep a log of your readings so that we can see how they trend.    Avoid these things for 30 minutes before checking your blood pressure:  Drinking caffeine.  Drinking alcohol.  Eating.  Smoking.  Exercising. Five minutes before checking your blood pressure:  Pee.  Sit in a dining chair. Avoid sitting in a soft couch or armchair.  Be quiet. Do not talk.  If you need a refill on your cardiac medications before your next appointment, please call your pharmacy.    Lab work: No new labs needed   If you have labs (blood work) drawn today and your tests are completely normal, you will receive your results only by: Marland Kitchen MyChart Message (if you have MyChart) OR . A paper copy in the mail If you have any lab test that is abnormal or we need to change your treatment, we will call you to review the results.   Testing/Procedures: No new testing needed   Follow-Up: At [facility], you and your health needs are our priority.  As part of our continuing mission to provide you with exceptional heart care, we have created designated Provider Care Teams.  These Care Teams include your primary Cardiologist (physician) and Advanced Practice Providers (APPs -  Physician Assistants and Nurse Practitioners) who all work together to provide you with the care you need, when you need it.  . You will need a follow up appointment in 12 months .   Please call our office 2 months in advance to  schedule this appointment.    . Providers on your designated Care Team:   . Murray Hodgkins, NP . Christell Faith, PA-C . Marrianne Mood, PA-C  Any Other Special Instructions Will Be Listed Below (If Applicable).  For educational health videos Log in to : [email or web address] Or : [email or web address], password : [email or web address]

## 2019-11-06 ENCOUNTER — Encounter: Payer: Self-pay | Admitting: Gastroenterology

## 2019-11-13 ENCOUNTER — Other Ambulatory Visit: Payer: Self-pay | Admitting: *Deleted

## 2019-11-13 DIAGNOSIS — I10 Essential (primary) hypertension: Secondary | ICD-10-CM

## 2019-11-13 MED ORDER — LISINOPRIL 40 MG PO TABS: 40.0000 mg | ORAL_TABLET | Freq: Every day | ORAL | 1 refills | Status: DC

## 2019-11-18 ENCOUNTER — Other Ambulatory Visit: Payer: Self-pay

## 2019-11-18 ENCOUNTER — Encounter (INDEPENDENT_AMBULATORY_CARE_PROVIDER_SITE_OTHER): Payer: Self-pay | Admitting: Vascular Surgery

## 2019-11-18 ENCOUNTER — Ambulatory Visit (INDEPENDENT_AMBULATORY_CARE_PROVIDER_SITE_OTHER): Payer: PPO | Admitting: Vascular Surgery

## 2019-11-18 VITALS — BP 161/78 | HR 90 | Resp 16 | Ht 63.5 in | Wt 196.0 lb

## 2019-11-18 DIAGNOSIS — M7989 Other specified soft tissue disorders: Secondary | ICD-10-CM | POA: Diagnosis not present

## 2019-11-18 DIAGNOSIS — I872 Venous insufficiency (chronic) (peripheral): Secondary | ICD-10-CM

## 2019-11-18 DIAGNOSIS — I10 Essential (primary) hypertension: Secondary | ICD-10-CM

## 2019-11-18 DIAGNOSIS — E785 Hyperlipidemia, unspecified: Secondary | ICD-10-CM

## 2019-11-18 NOTE — Progress Notes (Signed)
Patient ID: Christy Freeman, female   DOB: [birth year], 69 y.o.   MRN: [id number]  Chief Complaint  Patient presents with  . New Patient (Initial Visit)    ref [place] venous insufficiency    HPI Christy Freeman is a 69 y.o. female.  I am asked to see the patient by Dr. Rockey Situ for evaluation of venous stasis changes with swelling.  Over the past few months, the patient has noticed this significantly worsening.  This goes up to the mid to lower calf bilaterally.  This involves tightness of the tissue, red and purplish discoloration of the tissue, and some brown staining of the skin.  It may be a little worse on the right than the left.  No previous history of DVT or superficial thrombophlebitis to their knowledge.  No chest pain or shortness of breath.  The legs are tired and heavy.  The swelling and pain progressed throughout the day and is worse at night.  Elevation has helped this a little but it is still present.  The patient is done a fair bit of research online and has learned about venous disease and says her symptoms are very consistent with what she has read online which sounds like the case.     Past Medical History:  Diagnosis Date  . Blood transfusion without reported diagnosis   . Cancer ([place])    basal cell on face  . Colon polyps   . GERD (gastroesophageal reflux disease)   . H/O: depression   . Hypercholesterolemia    diet controlled  . Hypertension   . Lung nodule    left lung     Past Surgical History:  Procedure Laterality Date  . APPENDECTOMY  06/14/2017  . COLONOSCOPY    . CYST REMOVAL HAND Left   . DILATION AND CURETTAGE OF UTERUS    . LAPAROSCOPIC APPENDECTOMY N/A 06/14/2017   Procedure: APPENDECTOMY LAPAROSCOPIC;  Surgeon: Clayburn Pert, MD;  Location: [facility] ORS;  Service: General;  Laterality: N/A;     Family History  Problem Relation Age of Onset  . Diabetes Mother   . Heart failure Mother   . Hypertension Mother   . Stroke Father   .  Hypertension Father   . Heart disease Brother   . Stroke Maternal Grandmother   . Colon cancer Neg Hx      Social History   Tobacco Use  . Smoking status: Never Smoker  . Smokeless tobacco: Never Used  Substance Use Topics  . Alcohol use: Yes    Alcohol/week: 14.0 standard drinks    Types: 6 Glasses of wine, 8 Standard drinks or equivalent per week    Comment: 2 glasses of wine per night  . Drug use: No     No Known Allergies  Current Outpatient Medications  Medication Sig Dispense Refill  . amLODipine (NORVASC) 10 MG tablet TAKE 1 TABLET(10 MG) BY MOUTH DAILY 90 tablet 2  . aspirin EC 81 MG tablet Take 81 mg by mouth daily. Swallow whole.    . cetirizine (ZYRTEC) 10 MG tablet Take 10 mg by mouth daily.    . clobetasol ointment (TEMOVATE) 0.05 %     . ezetimibe (ZETIA) 10 MG tablet Take 1 tablet (10 mg total) by mouth daily. 90 tablet 3  . hydrochlorothiazide (HYDRODIURIL) 25 MG tablet Take 1 tablet (25 mg total) by mouth daily as needed (As needed for leg swelling or elevated blood pressure). 90 tablet 3  . lisinopril (ZESTRIL) 40  MG tablet Take 1 tablet (40 mg total) by mouth daily. 90 tablet 1  . metoprolol succinate (TOPROL-XL) 100 MG 24 hr tablet TAKE 1 TABLET BY MOUTH ONCE DAILY TAKE WITH OR IMMEADIATELY FOLLOWING A MEAL 90 tablet 3  . omeprazole (PRILOSEC) 10 MG capsule Take 1 capsule (10 mg total) by mouth daily. 90 capsule 2  . Probiotic Product (PROBIOTIC DAILY PO) Take 1 capsule by mouth daily.     . rosuvastatin (CRESTOR) 10 MG tablet Take 1 tablet (10 mg total) by mouth daily. 90 tablet 3  . venlafaxine XR (EFFEXOR-XR) 150 MG 24 hr capsule Take 1 capsule (150 mg total) by mouth daily. 90 capsule 2   No current facility-administered medications for this visit.      REVIEW OF SYSTEMS (Negative unless checked)  Constitutional: [] Weight loss  [] Fever  [] Chills Cardiac: [] Chest pain   [] Chest pressure   [] Palpitations   [] Shortness of breath when laying flat    [] Shortness of breath at rest   [] Shortness of breath with exertion. Vascular:  [] Pain in legs with walking   [] Pain in legs at rest   [] Pain in legs when laying flat   [] Claudication   [] Pain in feet when walking  [] Pain in feet at rest  [] Pain in feet when laying flat   [] History of DVT   [] Phlebitis   [] Swelling in legs   [] Varicose veins   [] Non-healing ulcers Pulmonary:   [] Uses home oxygen   [] Productive cough   [] Hemoptysis   [] Wheeze  [] COPD   [] Asthma Neurologic:  [] Dizziness  [] Blackouts   [] Seizures   [] History of stroke   [] History of TIA  [] Aphasia   [] Temporary blindness   [] Dysphagia   [] Weakness or numbness in arms   [] Weakness or numbness in legs Musculoskeletal:  [x] Arthritis   [] Joint swelling   [x] Joint pain   [] Low back pain Hematologic:  [] Easy bruising  [] Easy bleeding   [] Hypercoagulable state   [x] Anemic  [] Hepatitis Gastrointestinal:  [] Blood in stool   [] Vomiting blood  [x] Gastroesophageal reflux/heartburn   [] Abdominal pain Genitourinary:  [] Chronic kidney disease   [] Difficult urination  [] Frequent urination  [] Burning with urination   [] Hematuria Skin:  [] Rashes   [] Ulcers   [] Wounds Psychological:  [] History of anxiety   []  History of major depression.    Physical Exam BP (!) 161/78 (BP Location: Left Arm)   Pulse 90   Resp 16   Ht 5' 3.5" (1.613 m)   Wt 196 lb (88.9 kg)   BMI 34.18 kg/m  Gen:  WD/WN, NAD Head: [place]/AT, No temporalis wasting. Ear/Nose/Throat: Hearing grossly intact, nares w/o erythema or drainage, oropharynx w/o Erythema/Exudate Eyes: Conjunctiva clear, sclera non-icteric  Neck: trachea midline.  No JVD.  Pulmonary:  Good air movement, respirations not labored, no use of accessory muscles  Cardiac: RRR, no JVD Vascular:  Vessel Right Left  Radial Palpable Palpable                          DP  2+  2+  PT  1+  1+   Gastrointestinal:. No masses, surgical incisions, or scars. Musculoskeletal: M/S 5/5 throughout.  Extremities without  ischemic changes.  No deformity or atrophy.  Stasis changes are present bilaterally and would be mild to moderate.  Mild bilateral lower extremity edema. Neurologic: Sensation grossly intact in extremities.  Symmetrical.  Speech is fluent. Motor exam as listed above. Psychiatric: Judgment intact, Mood & affect appropriate for pt's clinical  situation. Dermatologic: No rashes or ulcers noted.  No cellulitis or open wounds.    Radiology No results found.  Labs No results found for this or any previous visit (from the past 2160 hour(s)).  Assessment/Plan:  Hypertension blood pressure control important in reducing the progression of atherosclerotic disease. On appropriate oral medications.   Hyperlipidemia lipid control important in reducing the progression of atherosclerotic disease.  Continue statin therapy.   Venous stasis dermatitis of both lower extremities  Recommend:  The patient has worsening venous stasis changes with pain and swelling in her legs.  I have had a long discussion with the patient regarding  varicose veins and why they cause symptoms.  Patient will begin wearing graduated compression stockings class 1 on a daily basis, beginning first thing in the morning and removing them in the evening. The patient is instructed specifically not to sleep in the stockings.    The patient  will also begin using over-the-counter analgesics such as Motrin 600 mg po TID to help control the symptoms.    In addition, behavioral modification including elevation during the day will be initiated.    Pending the results of these changes the  patient will be reevaluated in three months.   An  ultrasound of the venous system will be obtained.   Further plans will be based on the ultrasound results and whether conservative therapies are successful at eliminating the pain and swelling.   Swelling of limb I have had a long discussion with the patient regarding swelling and why it  causes  symptoms.  Patient will begin wearing graduated compression stockings class 1 (20-30 mmHg) on a daily basis a prescription was given. The patient will  beginning wearing the stockings first thing in the morning and removing them in the evening. The patient is instructed specifically not to sleep in the stockings.   In addition, behavioral modification will be initiated.  This will include frequent elevation, use of over the counter pain medications and exercise such as walking.  I have reviewed systemic causes for chronic edema such as liver, kidney and cardiac etiologies.  The patient denies problems with these organ systems.    Consideration for a lymph pump will also be made based upon the effectiveness of conservative therapy.  This would help to improve the edema control and prevent sequela such as ulcers and infections   Patient should undergo duplex ultrasound of the venous system to ensure that DVT or reflux is not present.  The patient will follow-up with me after the ultrasound.        Leotis Pain 11/19/2019, 9:34 AM   This note was created with Dragon medical transcription system.  Any errors from dictation are unintentional.

## 2019-11-19 DIAGNOSIS — I872 Venous insufficiency (chronic) (peripheral): Secondary | ICD-10-CM | POA: Insufficient documentation

## 2019-11-19 DIAGNOSIS — M7989 Other specified soft tissue disorders: Secondary | ICD-10-CM | POA: Insufficient documentation

## 2019-11-19 NOTE — Assessment & Plan Note (Signed)
blood pressure control important in reducing the progression of atherosclerotic disease. On appropriate oral medications.  

## 2019-11-19 NOTE — Assessment & Plan Note (Signed)
I have had a long discussion with the patient regarding swelling and why it  causes symptoms.  Patient will begin wearing graduated compression stockings class 1 (20-30 mmHg) on a daily basis a prescription was given. The patient will  beginning wearing the stockings first thing in the morning and removing them in the evening. The patient is instructed specifically not to sleep in the stockings.   In addition, behavioral modification will be initiated.  This will include frequent elevation, use of over the counter pain medications and exercise such as walking.  I have reviewed systemic causes for chronic edema such as liver, kidney and cardiac etiologies.  The patient denies problems with these organ systems.    Consideration for a lymph pump will also be made based upon the effectiveness of conservative therapy.  This would help to improve the edema control and prevent sequela such as ulcers and infections   Patient should undergo duplex ultrasound of the venous system to ensure that DVT or reflux is not present.  The patient will follow-up with me after the ultrasound.

## 2019-11-19 NOTE — Patient Instructions (Signed)

## 2019-11-19 NOTE — Assessment & Plan Note (Addendum)
lipid control important in reducing the progression of atherosclerotic disease. Continue statin therapy  

## 2019-11-19 NOTE — Assessment & Plan Note (Signed)
Recommend:  The patient has worsening venous stasis changes with pain and swelling in her legs.  I have had a long discussion with the patient regarding  varicose veins and why they cause symptoms.  Patient will begin wearing graduated compression stockings class 1 on a daily basis, beginning first thing in the morning and removing them in the evening. The patient is instructed specifically not to sleep in the stockings.    The patient  will also begin using over-the-counter analgesics such as Motrin 600 mg po TID to help control the symptoms.    In addition, behavioral modification including elevation during the day will be initiated.    Pending the results of these changes the  patient will be reevaluated in three months.   An  ultrasound of the venous system will be obtained.   Further plans will be based on the ultrasound results and whether conservative therapies are successful at eliminating the pain and swelling.

## 2019-12-19 ENCOUNTER — Ambulatory Visit (INDEPENDENT_AMBULATORY_CARE_PROVIDER_SITE_OTHER): Payer: PPO | Admitting: Vascular Surgery

## 2019-12-19 ENCOUNTER — Encounter (INDEPENDENT_AMBULATORY_CARE_PROVIDER_SITE_OTHER): Payer: PPO

## 2020-01-08 ENCOUNTER — Other Ambulatory Visit: Payer: Self-pay

## 2020-01-08 ENCOUNTER — Ambulatory Visit (AMBULATORY_SURGERY_CENTER): Payer: Self-pay

## 2020-01-08 ENCOUNTER — Encounter: Payer: Self-pay | Admitting: Gastroenterology

## 2020-01-08 VITALS — Ht 63.0 in | Wt 195.0 lb

## 2020-01-08 DIAGNOSIS — Z860100 Personal history of colon polyps, unspecified: Secondary | ICD-10-CM

## 2020-01-08 MED ORDER — SUTAB 1479-225-188 MG PO TABS: 1.0000 | ORAL_TABLET | ORAL | 0 refills | Status: DC

## 2020-01-08 NOTE — Progress Notes (Signed)
No egg or soy allergy known to patient  No issues with past sedation with any surgeries or procedures No intubation problems in the past  No FH of Malignant Hyperthermia No diet pills per patient No home 02 use per patient  No blood thinners per patient  Pt denies issues with constipation  No A fib or A flutter  EMMI video to [place] 19 guidelines implemented in PV today with Pt and RN   Coupon given to pt in PV today , Code to Pharmacy  COVID vaccines completed on 07/2019 per pt;   Due to the COVID-19 pandemic we are asking patients to follow these guidelines. Please only bring one care partner. Please be aware that your care partner may wait in the car in the parking lot or if they feel like they will be too hot to wait in the car, they may wait in the lobby on the 4th floor. All care partners are required to wear a mask the entire time (we do not have any that we can provide them), they need to practice social distancing, and we will do a Covid check for all patient's and care partners when you arrive. Also we will check their temperature and your temperature. If the care partner waits in their car they need to stay in the parking lot the entire time and we will call them on their cell phone when the patient is ready for discharge so they can bring the car to the front of the building. Also all patient's will need to wear a mask into building.

## 2020-01-13 ENCOUNTER — Encounter (INDEPENDENT_AMBULATORY_CARE_PROVIDER_SITE_OTHER): Payer: PPO

## 2020-01-13 ENCOUNTER — Ambulatory Visit (INDEPENDENT_AMBULATORY_CARE_PROVIDER_SITE_OTHER): Payer: PPO | Admitting: Vascular Surgery

## 2020-01-15 DIAGNOSIS — H2513 Age-related nuclear cataract, bilateral: Secondary | ICD-10-CM | POA: Diagnosis not present

## 2020-01-22 ENCOUNTER — Encounter: Payer: Self-pay | Admitting: Gastroenterology

## 2020-01-22 ENCOUNTER — Other Ambulatory Visit: Payer: Self-pay

## 2020-01-22 ENCOUNTER — Ambulatory Visit (AMBULATORY_SURGERY_CENTER): Payer: PPO | Admitting: Gastroenterology

## 2020-01-22 VITALS — BP 148/79 | HR 71 | Temp 97.5°F | Resp 15 | Ht 63.0 in | Wt 195.0 lb

## 2020-01-22 DIAGNOSIS — D123 Benign neoplasm of transverse colon: Secondary | ICD-10-CM | POA: Diagnosis not present

## 2020-01-22 DIAGNOSIS — Z86010 Personal history of colonic polyps: Secondary | ICD-10-CM | POA: Diagnosis not present

## 2020-01-22 DIAGNOSIS — D125 Benign neoplasm of sigmoid colon: Secondary | ICD-10-CM | POA: Diagnosis not present

## 2020-01-22 DIAGNOSIS — Z1211 Encounter for screening for malignant neoplasm of colon: Secondary | ICD-10-CM | POA: Diagnosis not present

## 2020-01-22 MED ORDER — SODIUM CHLORIDE 0.9 % IV SOLN: 500.0000 mL | Freq: Once | INTRAVENOUS | Status: DC

## 2020-01-22 NOTE — Op Note (Signed)
[facility] Patient Name: Christy Freeman Procedure Date: 01/22/2020 8:27 AM MRN: [id number] Endoscopist: Ladene Artist , MD Age: 69 Referring MD:  Date of Birth: 1950/06/12 Gender: Female Account #: [id number] Procedure:                Colonoscopy Indications:              Surveillance: Personal history of adenomatous                            polyps on last colonoscopy 5 years ago Medicines:                Monitored Anesthesia Care Procedure:                Pre-Anesthesia Assessment:                           - Prior to the procedure, a History and Physical                            was performed, and patient medications and                            allergies were reviewed. The patient's tolerance of                            previous anesthesia was also reviewed. The risks                            and benefits of the procedure and the sedation                            options and risks were discussed with the patient.                            All questions were answered, and informed consent                            was obtained. Prior Anticoagulants: The patient has                            taken no previous anticoagulant or antiplatelet                            agents. ASA Grade Assessment: III - A patient with                            severe systemic disease. After reviewing the risks                            and benefits, the patient was deemed in                            satisfactory condition to undergo the procedure.  After obtaining informed consent, the colonoscope                            was passed under direct vision. Throughout the                            procedure, the patient's blood pressure, pulse, and                            oxygen saturations were monitored continuously. The                            Colonoscope was introduced through the anus and                            advanced to the the  cecum, identified by                            appendiceal orifice and ileocecal valve. The                            ileocecal valve, appendiceal orifice, and rectum                            were photographed. The quality of the bowel                            preparation was good. The colonoscopy was performed                            without difficulty. The patient tolerated the                            procedure well. Scope In: 8:35:46 AM Scope Out: 8:54:16 AM Scope Withdrawal Time: 0 hours 13 minutes 25 seconds  Total Procedure Duration: 0 hours 18 minutes 30 seconds  Findings:                 The perianal and digital rectal examinations showed                            external hemorrhoids, otherwise normal.                           Three sessile polyps were found in the sigmoid                            colon (1) and transverse colon (3). The polyps were                            5 to 7 mm in size. These polyps were removed with a                            cold snare. Resection and retrieval were complete.  Multiple small-mouthed diverticula were found in                            the left colon. There was no evidence of                            diverticular bleeding.                           External and internal hemorrhoids were found during                            retroflexion. The hemorrhoids were small and Grade                            I (internal hemorrhoids that do not prolapse).                           The exam was otherwise without abnormality on                            direct and retroflexion views. Complications:            No immediate complications. Estimated blood loss:                            None. Estimated Blood Loss:     Estimated blood loss: none. Impression:               - Three 5 to 7 mm polyps in the sigmoid colon and                            in the transverse colon, removed with a cold snare.                             Resected and retrieved.                           - Mild diverticulosis in the left colon.                           - External and internal hemorrhoids.                           - The examination was otherwise normal on direct                            and retroflexion views. Recommendation:           - Repeat colonoscopy date to be determined after                            pending pathology results are reviewed for                            surveillance based on pathology results.                           -  Patient has a contact number available for                            emergencies. The signs and symptoms of potential                            delayed complications were discussed with the                            patient. Return to normal activities tomorrow.                            Written discharge instructions were provided to the                            patient.                           - High fiber diet.                           - Continue present medications.                           - Await pathology results. Ladene Artist, MD 01/22/2020 8:58:34 AM This report has been signed electronically.

## 2020-01-22 NOTE — Progress Notes (Signed)
PT taken to PACU. Monitors in place. VSS. Report given to RN. 

## 2020-01-22 NOTE — Progress Notes (Signed)
Called to room to assist during endoscopic procedure.  Patient ID and intended procedure confirmed with present staff. Received instructions for my participation in the procedure from the performing physician.  

## 2020-01-22 NOTE — Patient Instructions (Signed)
Handouts on polyps, diverticulosis, and high fiber diet given to you today.  Await pathology results     YOU HAD AN ENDOSCOPIC PROCEDURE TODAY AT [facility]:   Refer to the procedure report that was given to you for any specific questions about what was found during the examination.  If the procedure report does not answer your questions, please call your gastroenterologist to clarify.  If you requested that your care partner not be given the details of your procedure findings, then the procedure report has been included in a sealed envelope for you to review at your convenience later.  YOU SHOULD EXPECT: Some feelings of bloating in the abdomen. Passage of more gas than usual.  Walking can help get rid of the air that was put into your GI tract during the procedure and reduce the bloating. If you had a lower endoscopy (such as a colonoscopy or flexible sigmoidoscopy) you may notice spotting of blood in your stool or on the toilet paper. If you underwent a bowel prep for your procedure, you may not have a normal bowel movement for a few days.  Please Note:  You might notice some irritation and congestion in your nose or some drainage.  This is from the oxygen used during your procedure.  There is no need for concern and it should clear up in a day or so.  SYMPTOMS TO REPORT IMMEDIATELY:   Following lower endoscopy (colonoscopy or flexible sigmoidoscopy):  Excessive amounts of blood in the stool  Significant tenderness or worsening of abdominal pains  Swelling of the abdomen that is new, acute  Fever of 100F or higher  For urgent or emergent issues, a gastroenterologist can be reached at any hour by calling [phone]. Do not use MyChart messaging for urgent concerns.    DIET:  We do recommend a small meal at first, but then you may proceed to your regular diet.  Drink plenty of fluids but you should avoid alcoholic beverages for 24 hours.  ACTIVITY:  You should plan  to take it easy for the rest of today and you should NOT DRIVE or use heavy machinery until tomorrow (because of the sedation medicines used during the test).    FOLLOW UP: Our staff will call the number listed on your records 48-72 hours following your procedure to check on you and address any questions or concerns that you may have regarding the information given to you following your procedure. If we do not reach you, we will leave a message.  We will attempt to reach you two times.  During this call, we will ask if you have developed any symptoms of COVID 19. If you develop any symptoms (ie: fever, flu-like symptoms, shortness of breath, cough etc.) before then, please call [phone].  If you test positive for Covid 19 in the 2 weeks post procedure, please call and report this information to [place].    If any biopsies were taken you will be contacted by phone or by letter within the next 1-3 weeks.  Please call us at [phone] if you have not heard about the biopsies in 3 weeks.    SIGNATURES/CONFIDENTIALITY: You and/or your care partner have signed paperwork which will be entered into your electronic medical record.  These signatures attest to the fact that that the information above on your After Visit Summary has been reviewed and is understood.  Full responsibility of the confidentiality of this discharge information lies with you and/or your  care-partner.

## 2020-01-22 NOTE — Progress Notes (Signed)
Pt's states no medical or surgical changes since previsit or office visit.  [facility]- vitals

## 2020-01-26 ENCOUNTER — Telehealth: Payer: Self-pay

## 2020-01-26 NOTE — Telephone Encounter (Signed)
  Follow up Call-  Call back number 01/22/2020  Post procedure Call Back phone  # [phone]  Permission to leave phone message Yes  Some recent data might be hidden     Patient questions:  Do you have a fever, pain , or abdominal swelling? No. Pain Score  0 *  Have you tolerated food without any problems? Yes.    Have you been able to return to your normal activities? Yes.    Do you have any questions about your discharge instructions: Diet   No. Medications  No. Follow up visit  No.  Do you have questions or concerns about your Care? No.  Actions: * If pain score is 4 or above: No action needed, pain <4.  1. Have you developed a fever since your procedure? no  2.   Have you had an respiratory symptoms (SOB or cough) since your procedure? no  3.   Have you tested positive for COVID 19 since your procedure no  4.   Have you had any family members/close contacts diagnosed with the COVID 19 since your procedure?  no   If yes to any of these questions please route to Joylene John, RN and Joella Prince, RN

## 2020-01-29 ENCOUNTER — Other Ambulatory Visit: Payer: Self-pay | Admitting: *Deleted

## 2020-01-29 DIAGNOSIS — K2100 Gastro-esophageal reflux disease with esophagitis, without bleeding: Secondary | ICD-10-CM

## 2020-01-29 MED ORDER — OMEPRAZOLE 10 MG PO CPDR: 10.0000 mg | DELAYED_RELEASE_CAPSULE | Freq: Every day | ORAL | 1 refills | Status: DC

## 2020-01-30 ENCOUNTER — Ambulatory Visit (INDEPENDENT_AMBULATORY_CARE_PROVIDER_SITE_OTHER): Payer: PPO | Admitting: Nurse Practitioner

## 2020-01-30 ENCOUNTER — Encounter (INDEPENDENT_AMBULATORY_CARE_PROVIDER_SITE_OTHER): Payer: Self-pay | Admitting: Nurse Practitioner

## 2020-01-30 ENCOUNTER — Ambulatory Visit (INDEPENDENT_AMBULATORY_CARE_PROVIDER_SITE_OTHER): Payer: PPO

## 2020-01-30 ENCOUNTER — Other Ambulatory Visit: Payer: Self-pay

## 2020-01-30 VITALS — BP 163/81 | HR 74 | Ht 63.0 in | Wt 192.0 lb

## 2020-01-30 DIAGNOSIS — I10 Essential (primary) hypertension: Secondary | ICD-10-CM | POA: Diagnosis not present

## 2020-01-30 DIAGNOSIS — E785 Hyperlipidemia, unspecified: Secondary | ICD-10-CM

## 2020-01-30 DIAGNOSIS — I872 Venous insufficiency (chronic) (peripheral): Secondary | ICD-10-CM

## 2020-01-30 DIAGNOSIS — M7989 Other specified soft tissue disorders: Secondary | ICD-10-CM | POA: Diagnosis not present

## 2020-01-30 NOTE — Progress Notes (Signed)
Subjective:    Patient ID: Christy Freeman, female    DOB: June 09, 1950, 69 y.o.   MRN: [id number] Chief Complaint  Patient presents with  . Follow-up    U/S follow up    The patient returns to the office for followup evaluation regarding leg swelling.  The swelling has improved quite a bit and the pain associated with swelling has decreased substantially. There have not been any interval development of a ulcerations or wounds.  Upon the patient's initial presentation she had been having a.  Lots of standing and swelling at the end of the day.  The patient also notes that her legs get red after hot shower or with wine.  Since the previous visit the patient has noted that the swelling has not been a significant but that the discoloration of her lower extremities has remained.  She denies any signs symptoms of cellulitis, or fever, chills, nausea, vomiting or diarrhea.  Today noninvasive studies show evidence of deep venous insufficiency in the left common femoral vein.  There is evidence of reflux in the great saphenous vein at the saphenofemoral junction bilaterally.  There is no evidence of DVT or superficial venous thrombosis bilaterally.     Review of Systems  Cardiovascular: Positive for leg swelling.       Objective:   Physical Exam Vitals reviewed.  HENT:     Head: Normocephalic.  Cardiovascular:     Rate and Rhythm: Normal rate and regular rhythm.     Pulses: Normal pulses.  Pulmonary:     Effort: Pulmonary effort is normal.  Skin:    General: Skin is warm and dry.     Comments: Stasis dermatitis   Neurological:     Mental Status: She is alert and oriented to person, place, and time.  Psychiatric:        Mood and Affect: Mood normal.        Behavior: Behavior normal.        Thought Content: Thought content normal.        Judgment: Judgment normal.     BP (!) 163/81   Pulse 74   Ht 5' 3"  (1.6 m)   Wt 192 lb (87.1 kg)   BMI 34.01 kg/m   Past Medical  History:  Diagnosis Date  . Allergy    seasonal allergies  . Anxiety    hx- on meds  . Arthritis    Right hand  . Blood transfusion without reported diagnosis 1989  . Cancer ([place])    basal cell on face  . Cataract   . Colon polyps   . GERD (gastroesophageal reflux disease)   . H/O: depression   . Hypercholesterolemia    diet controlled  . Hypertension   . Lung nodule    left lung     Social History   Socioeconomic History  . Marital status: Married    Spouse name: Eulas Post  . Number of children: 0  . Years of education: Not on file  . Highest education level: Not on file  Occupational History    Comment:  chemist  Tobacco Use  . Smoking status: Never Smoker  . Smokeless tobacco: Never Used  Vaping Use  . Vaping Use: Never used  Substance and Sexual Activity  . Alcohol use: Yes    Alcohol/week: 14.0 standard drinks    Types: 6 Glasses of wine, 8 Standard drinks or equivalent per week    Comment: 2 glasses of wine per night  .  Drug use: No  . Sexual activity: Yes  Other Topics Concern  . Not on file  Social History Narrative   She would desire CPR.   Does have a living will.   Social Determinants of Health   Financial Resource Strain:   . Difficulty of Paying Living Expenses: Not on file  Food Insecurity:   . Worried About Charity fundraiser in the Last Year: Not on file  . Ran Out of Food in the Last Year: Not on file  Transportation Needs:   . Lack of Transportation (Medical): Not on file  . Lack of Transportation (Non-Medical): Not on file  Physical Activity:   . Days of Exercise per Week: Not on file  . Minutes of Exercise per Session: Not on file  Stress:   . Feeling of Stress : Not on file  Social Connections:   . Frequency of Communication with Friends and Family: Not on file  . Frequency of Social Gatherings with Friends and Family: Not on file  . Attends Religious Services: Not on file  . Active Member of Clubs or Organizations: Not on file    . Attends Archivist Meetings: Not on file  . Marital Status: Not on file  Intimate Partner Violence:   . Fear of Current or Ex-Partner: Not on file  . Emotionally Abused: Not on file  . Physically Abused: Not on file  . Sexually Abused: Not on file    Past Surgical History:  Procedure Laterality Date  . APPENDECTOMY  06/14/2017  . COLONOSCOPY  2016   TA x 1  . CYST REMOVAL HAND Left   . DILATION AND CURETTAGE OF UTERUS    . LAPAROSCOPIC APPENDECTOMY N/A 06/14/2017   Procedure: APPENDECTOMY LAPAROSCOPIC;  Surgeon: Clayburn Pert, MD;  Location: [facility] ORS;  Service: General;  Laterality: N/A;  . POLYPECTOMY  2016   TA    Family History  Problem Relation Age of Onset  . Diabetes Mother   . Heart failure Mother   . Hypertension Mother   . Stroke Father   . Hypertension Father   . Heart disease Brother   . Stroke Maternal Grandmother   . Colon cancer Neg Hx   . Colon polyps Neg Hx   . Esophageal cancer Neg Hx   . Stomach cancer Neg Hx   . Rectal cancer Neg Hx     No Known Allergies     Assessment & Plan:   1. Chronic venous insufficiency No surgery or intervention at this point in time.    I have had a long discussion with the patient regarding venous insufficiency and why it  causes symptoms. I have discussed with the patient the chronic skin changes that accompany venous insufficiency and the long term sequela such as infection and ulceration.  Patient will begin wearing graduated compression stockings class 1 (20-30 mmHg) or compression wraps on a daily basis a prescription was given. The patient will put the stockings on first thing in the morning and removing them in the evening. The patient is instructed specifically not to sleep in the stockings.    In addition, behavioral modification including several periods of elevation of the lower extremities during the day will be continued.   The patient should begin routine exercise, especially walking on a  daily basis  Patient should undergo duplex ultrasound of the venous system to ensure that DVT or reflux is not present.  Following the review of the ultrasound the patient will follow  up in 12 months to reassess the degree of swelling and the control that graduated compression stockings or compression wraps  is offering.     2. Essential hypertension Continue antihypertensive medications as already ordered, these medications have been reviewed and there are no changes at this time.   3. Hyperlipidemia, unspecified hyperlipidemia type Continue statin as ordered and reviewed, no changes at this time    Current Outpatient Medications on File Prior to Visit  Medication Sig Dispense Refill  . amLODipine (NORVASC) 10 MG tablet TAKE 1 TABLET(10 MG) BY MOUTH DAILY 90 tablet 2  . aspirin EC 81 MG tablet Take 81 mg by mouth daily. Swallow whole.    . cetirizine (ZYRTEC) 10 MG tablet Take 10 mg by mouth daily.    . Cholecalciferol (VITAMIN D3 PO) Take by mouth.    . clobetasol ointment (TEMOVATE) 0.05 %     . lisinopril (ZESTRIL) 40 MG tablet Take 1 tablet (40 mg total) by mouth daily. 90 tablet 1  . magnesium 30 MG tablet Take 30 mg by mouth 2 (two) times daily.    . metoprolol succinate (TOPROL-XL) 100 MG 24 hr tablet TAKE 1 TABLET BY MOUTH ONCE DAILY TAKE WITH OR IMMEADIATELY FOLLOWING A MEAL 90 tablet 3  . omeprazole (PRILOSEC) 10 MG capsule Take 1 capsule (10 mg total) by mouth daily. 90 capsule 1  . Probiotic Product (PROBIOTIC DAILY PO) Take 1 capsule by mouth daily.     Marland Kitchen venlafaxine XR (EFFEXOR-XR) 150 MG 24 hr capsule Take 1 capsule (150 mg total) by mouth daily. 90 capsule 2  . ezetimibe (ZETIA) 10 MG tablet Take 1 tablet (10 mg total) by mouth daily. (Patient not taking: Reported on 01/08/2020) 90 tablet 3  . hydrochlorothiazide (HYDRODIURIL) 25 MG tablet Take 1 tablet (25 mg total) by mouth daily as needed (As needed for leg swelling or elevated blood pressure). (Patient not taking:  Reported on 01/08/2020) 90 tablet 3  . rosuvastatin (CRESTOR) 10 MG tablet Take 1 tablet (10 mg total) by mouth daily. 90 tablet 3   Current Facility-Administered Medications on File Prior to Visit  Medication Dose Route Frequency Provider Last Rate Last Admin  . 0.9 %  sodium chloride infusion  500 mL Intravenous Once Lucio Edward T, MD      . 0.9 %  sodium chloride infusion  500 mL Intravenous Once Ladene Artist, MD        There are no Patient Instructions on file for this visit. No follow-ups on file.   Kris Hartmann, NP

## 2020-02-02 ENCOUNTER — Encounter: Payer: Self-pay | Admitting: Gastroenterology

## 2020-02-13 ENCOUNTER — Other Ambulatory Visit: Payer: Self-pay | Admitting: Family Medicine

## 2020-02-13 DIAGNOSIS — Z0000 Encounter for general adult medical examination without abnormal findings: Secondary | ICD-10-CM

## 2020-03-26 ENCOUNTER — Other Ambulatory Visit: Payer: Self-pay

## 2020-03-26 ENCOUNTER — Other Ambulatory Visit (INDEPENDENT_AMBULATORY_CARE_PROVIDER_SITE_OTHER): Payer: PPO

## 2020-03-26 ENCOUNTER — Telehealth: Payer: Self-pay | Admitting: Family Medicine

## 2020-03-26 DIAGNOSIS — E785 Hyperlipidemia, unspecified: Secondary | ICD-10-CM

## 2020-03-26 DIAGNOSIS — E559 Vitamin D deficiency, unspecified: Secondary | ICD-10-CM | POA: Diagnosis not present

## 2020-03-26 LAB — COMPREHENSIVE METABOLIC PANEL
ALT: 35 U/L (ref 0–35)
AST: 39 U/L — ABNORMAL HIGH (ref 0–37)
Albumin: 4.5 g/dL (ref 3.5–5.2)
Alkaline Phosphatase: 139 U/L — ABNORMAL HIGH (ref 39–117)
BUN: 11 mg/dL (ref 6–23)
CO2: 30 mEq/L (ref 19–32)
Calcium: 9.8 mg/dL (ref 8.4–10.5)
Chloride: 98 mEq/L (ref 96–112)
Creatinine, Ser: 0.63 mg/dL (ref 0.40–1.20)
GFR: 90.32 mL/min (ref >60.00)
Glucose, Bld: 100 mg/dL — ABNORMAL HIGH (ref 70–99)
Potassium: 4.7 mEq/L (ref 3.5–5.1)
Sodium: 136 mEq/L (ref 135–145)
Total Bilirubin: 0.6 mg/dL (ref 0.2–1.2)
Total Protein: 7.9 g/dL (ref 6.0–8.3)

## 2020-03-26 LAB — VITAMIN D 25 HYDROXY (VIT D DEFICIENCY, FRACTURES): VITD: 39.41 ng/mL (ref 30.00–100.00)

## 2020-03-26 LAB — LIPID PANEL
Cholesterol: 214 mg/dL — ABNORMAL HIGH (ref 0–200)
HDL: 74.1 mg/dL (ref >39.00)
LDL Cholesterol: 115 mg/dL — ABNORMAL HIGH (ref 0–99)
NonHDL: 139.87
Total CHOL/HDL Ratio: 3
Triglycerides: 126 mg/dL (ref 0.0–149.0)
VLDL: 25.2 mg/dL (ref 0.0–40.0)

## 2020-03-26 NOTE — Telephone Encounter (Signed)
-----   Message from Ellamae Sia sent at 03/10/2020 12:25 PM EDT ----- Regarding: Lab orders for Friday, 10.22.21 Patient is scheduled for CPX labs, please order future labs, Thanks , Karna Christmas

## 2020-03-26 NOTE — Progress Notes (Signed)
No critical labs need to be addressed urgently. We will discuss labs in detail at upcoming office visit.   

## 2020-03-29 ENCOUNTER — Ambulatory Visit: Payer: PPO

## 2020-04-02 ENCOUNTER — Ambulatory Visit (INDEPENDENT_AMBULATORY_CARE_PROVIDER_SITE_OTHER): Payer: PPO | Admitting: Family Medicine

## 2020-04-02 ENCOUNTER — Other Ambulatory Visit: Payer: Self-pay

## 2020-04-02 ENCOUNTER — Encounter: Payer: Self-pay | Admitting: Family Medicine

## 2020-04-02 VITALS — BP 150/72 | HR 82 | Temp 98.4°F | Ht 64.0 in | Wt 196.5 lb

## 2020-04-02 DIAGNOSIS — I10 Essential (primary) hypertension: Secondary | ICD-10-CM

## 2020-04-02 DIAGNOSIS — K7581 Nonalcoholic steatohepatitis (NASH): Secondary | ICD-10-CM | POA: Diagnosis not present

## 2020-04-02 DIAGNOSIS — E785 Hyperlipidemia, unspecified: Secondary | ICD-10-CM

## 2020-04-02 DIAGNOSIS — Z0000 Encounter for general adult medical examination without abnormal findings: Secondary | ICD-10-CM | POA: Diagnosis not present

## 2020-04-02 DIAGNOSIS — E559 Vitamin D deficiency, unspecified: Secondary | ICD-10-CM

## 2020-04-02 DIAGNOSIS — I700 Atherosclerosis of aorta: Secondary | ICD-10-CM

## 2020-04-02 DIAGNOSIS — Z23 Encounter for immunization: Secondary | ICD-10-CM

## 2020-04-02 DIAGNOSIS — F321 Major depressive disorder, single episode, moderate: Secondary | ICD-10-CM

## 2020-04-02 NOTE — Patient Instructions (Addendum)
Follow BP at home daily for next 1-2 weeks.. call of Mychart with the results, also to Dr. Rockey Situ. Add back zetia to crestor 10 mg.. work on low cholesterol diet, increase exercise and weight loss.    Preventive Care 49 Years and Older, Female Preventive care refers to lifestyle choices and visits with your health care provider that can promote health and wellness. This includes:  A yearly physical exam. This is also called an annual well check.  Regular dental and eye exams.  Immunizations.  Screening for certain conditions.  Healthy lifestyle choices, such as diet and exercise. What can I expect for my preventive care visit? Physical exam Your health care provider will check:  Height and weight. These may be used to calculate body mass index (BMI), which is a measurement that tells if you are at a healthy weight.  Heart rate and blood pressure.  Your skin for abnormal spots. Counseling Your health care provider may ask you questions about:  Alcohol, tobacco, and drug use.  Emotional well-being.  Home and relationship well-being.  Sexual activity.  Eating habits.  History of falls.  Memory and ability to understand (cognition).  Work and work Statistician.  Pregnancy and menstrual history. What immunizations do I need?  Influenza (flu) vaccine  This is recommended every year. Tetanus, diphtheria, and pertussis (Tdap) vaccine  You may need a Td booster every 10 years. Varicella (chickenpox) vaccine  You may need this vaccine if you have not already been vaccinated. Zoster (shingles) vaccine  You may need this after age 46. Pneumococcal conjugate (PCV13) vaccine  One dose is recommended after age 45. Pneumococcal polysaccharide (PPSV23) vaccine  One dose is recommended after age 71. Measles, mumps, and rubella (MMR) vaccine  You may need at least one dose of MMR if you were born in 1957 or later. You may also need a second dose. Meningococcal  conjugate (MenACWY) vaccine  You may need this if you have certain conditions. Hepatitis A vaccine  You may need this if you have certain conditions or if you travel or work in places where you may be exposed to hepatitis A. Hepatitis B vaccine  You may need this if you have certain conditions or if you travel or work in places where you may be exposed to hepatitis B. Haemophilus influenzae type b (Hib) vaccine  You may need this if you have certain conditions. You may receive vaccines as individual doses or as more than one vaccine together in one shot (combination vaccines). Talk with your health care provider about the risks and benefits of combination vaccines. What tests do I need? Blood tests  Lipid and cholesterol levels. These may be checked every 5 years, or more frequently depending on your overall health.  Hepatitis C test.  Hepatitis B test. Screening  Lung cancer screening. You may have this screening every year starting at age 51 if you have a 30-pack-year history of smoking and currently smoke or have quit within the past 15 years.  Colorectal cancer screening. All adults should have this screening starting at age 27 and continuing until age 27. Your health care provider may recommend screening at age 46 if you are at increased risk. You will have tests every 1-10 years, depending on your results and the type of screening test.  Diabetes screening. This is done by checking your blood sugar (glucose) after you have not eaten for a while (fasting). You may have this done every 1-3 years.  Mammogram. This may be  done every 1-2 years. Talk with your health care provider about how often you should have regular mammograms.  BRCA-related cancer screening. This may be done if you have a family history of breast, ovarian, tubal, or peritoneal cancers. Other tests  Sexually transmitted disease (STD) testing.  Bone density scan. This is done to screen for osteoporosis. You may  have this done starting at age 65. Follow these instructions at home: Eating and drinking  Eat a diet that includes fresh fruits and vegetables, whole grains, lean protein, and low-fat dairy products. Limit your intake of foods with high amounts of sugar, saturated fats, and salt.  Take vitamin and mineral supplements as recommended by your health care provider.  Do not drink alcohol if your health care provider tells you not to drink.  If you drink alcohol: ? Limit how much you have to 0-1 drink a day. ? Be aware of how much alcohol is in your drink. In the [place] one drink equals one 12 oz bottle of beer (355 mL), one 5 oz glass of wine (148 mL), or one 1 oz glass of hard liquor (44 mL). Lifestyle  Take daily care of your teeth and gums.  Stay active. Exercise for at least 30 minutes on 5 or more days each week.  Do not use any products that contain nicotine or tobacco, such as cigarettes, e-cigarettes, and chewing tobacco. If you need help quitting, ask your health care provider.  If you are sexually active, practice safe sex. Use a condom or other form of protection in order to prevent STIs (sexually transmitted infections).  Talk with your health care provider about taking a low-dose aspirin or statin. What's next?  Go to your health care provider once a year for a well check visit.  Ask your health care provider how often you should have your eyes and teeth checked.  Stay up to date on all vaccines. This information is not intended to replace advice given to you by your health care provider. Make sure you discuss any questions you have with your health care provider. Document Revised: 05/16/2018 Document Reviewed: 05/16/2018 Elsevier Patient Education  2020 [employer]  

## 2020-04-02 NOTE — Assessment & Plan Note (Signed)
Elevated  BP  In office.. follow at home and call with measurements.

## 2020-04-02 NOTE — Assessment & Plan Note (Signed)
Stable control on effexor daily

## 2020-04-02 NOTE — Assessment & Plan Note (Signed)
Improving from last year. On statin.

## 2020-04-02 NOTE — Progress Notes (Signed)
Chief Complaint  Patient presents with  . Medicare Wellness    History of Present Illness: HPI   The patient presents for annual medicare wellness, complete physical and review of chronic health problems. He/She also has the following acute concerns today:  I have personally reviewed the Medicare Annual Wellness questionnaire and have noted 1. The patient's medical and social history 2. Their use of alcohol, tobacco or illicit drugs 3. Their current medications and supplements 4. The patient's functional ability including ADL's, fall risks, home safety risks and hearing or visual             impairment. 5. Diet and physical activities 6. Evidence for depression or mood disorders 7.         Updated provider list Cognitive evaluation was performed and recorded on pt medicare questionnaire form. The patients weight, height, BMI and visual acuity have been recorded in the chart  I have made referrals, counseling and provided education to the patient based review of the above and I have provided the pt with a written personalized care plan for preventive services.   Documentation of this information was scanned into the electronic record under the media tab.   Advance directives and end of life planning reviewed in detail with patient and documented in EMR. Patient given handout on advance care directives if needed. HCPOA and living will updated if needed.   Hearing Screening   Method: Audiometry   125Hz  250Hz  500Hz  1000Hz  2000Hz  3000Hz  4000Hz  6000Hz  8000Hz   Right ear:           Left ear:           Comments: Hearing Aide for Right Ear  Vision Screening Comments: Wears Glasses-Eye Exam with Dr. Edison Pace at [facility] 2021   Has been under a lot of stress.   Office Visit from 03/26/2019 in [place]  PHQ-2 Total Score 1      Fall Risk  04/02/2020 10/09/2018 09/05/2018 09/25/2017 08/15/2017  Falls in the past year? 0 1 0 No No  Number falls in past yr: - 0 - - -   Injury with Fall? - 1 - - -  Follow up - - Falls evaluation completed - -    Hypertension: Above goal in office today on amlodipine, lisinopril, BBlocker BP Readings from Last 3 Encounters:  04/02/20 (!) 150/72  01/30/20 (!) 163/81  01/22/20 (!) 148/79  Using medication without problems or lightheadedness: none Chest pain with exertion:none Edema:occ Short of breath:none Average home BPs: not checking at home Other issues: Seeing Dr. Lucky Cowboy for venous insufficiency.. recommended compression  Elevated Cholesterol: LDL not at goal < 70, but improved on Crestor 10, but not currently  zetia  ( hx of aortic atherosclerosis) Lab Results  Component Value Date   CHOL 214 (H) 03/26/2020   HDL 74.10 03/26/2020   LDLCALC 115 (H) 03/26/2020   LDLDIRECT 191.0 07/30/2014   TRIG 126.0 03/26/2020   CHOLHDL 3 03/26/2020  Using medications without problems:none Muscle aches: none Diet compliance: heart healthy Exercise: walking a lot Other complaints: Aortic atherosclerosis.Marland Kitchen risk factor reduction  MDD: stable control on effexor daily  Vit D LXB:WIOMBTDH on supplementation  Steatohepatitis: Dx 10 years ago. Now improving with improved diet, only occ exercise. CT liver 04/02/2019  Mild enlargement and fatty liver.   This visit occurred during the SARS-CoV-2 public health emergency.  Safety protocols were in place, including screening questions prior to the visit, additional usage of staff PPE, and extensive cleaning  of exam room while observing appropriate contact time as indicated for disinfecting solutions.   COVID 19 screen:  No recent travel or known exposure to COVID19 The patient denies respiratory symptoms of COVID 19 at this time. The importance of social distancing was discussed today.     Review of Systems  Constitutional: Negative for chills and fever.  HENT: Negative for congestion and ear pain.   Eyes: Negative for pain and redness.  Respiratory: Negative for cough and  shortness of breath.   Cardiovascular: Negative for chest pain, palpitations and leg swelling.  Gastrointestinal: Negative for abdominal pain, blood in stool, constipation, diarrhea, nausea and vomiting.  Genitourinary: Negative for dysuria.  Musculoskeletal: Negative for falls and myalgias.  Skin: Negative for rash.  Neurological: Negative for dizziness.  Psychiatric/Behavioral: Negative for depression. The patient is not nervous/anxious.       Past Medical History:  Diagnosis Date  . Allergy    seasonal allergies  . Anxiety    hx- on meds  . Arthritis    Right hand  . Blood transfusion without reported diagnosis 1989  . Cancer ([place])    basal cell on face  . Cataract   . Colon polyps   . GERD (gastroesophageal reflux disease)   . H/O: depression   . Hypercholesterolemia    diet controlled  . Hypertension   . Lung nodule    left lung     reports that she has never smoked. She has never used smokeless tobacco. She reports current alcohol use of about 14.0 standard drinks of alcohol per week. She reports that she does not use drugs.   Current Outpatient Medications:  .  amLODipine (NORVASC) 10 MG tablet, TAKE 1 TABLET(10 MG) BY MOUTH DAILY, Disp: 90 tablet, Rfl: 2 .  aspirin EC 81 MG tablet, Take 81 mg by mouth daily. Swallow whole., Disp: , Rfl:  .  cetirizine (ZYRTEC) 10 MG tablet, Take 10 mg by mouth daily., Disp: , Rfl:  .  Cholecalciferol (VITAMIN D3 PO), Take by mouth., Disp: , Rfl:  .  clobetasol ointment (TEMOVATE) 0.05 %, , Disp: , Rfl:  .  lisinopril (ZESTRIL) 40 MG tablet, Take 1 tablet (40 mg total) by mouth daily., Disp: 90 tablet, Rfl: 1 .  magnesium 30 MG tablet, Take 30 mg by mouth 2 (two) times daily., Disp: , Rfl:  .  metoprolol succinate (TOPROL-XL) 100 MG 24 hr tablet, TAKE 1 TABLET BY MOUTH ONCE DAILY TAKE WITH OR IMMEADIATELY FOLLOWING A MEAL, Disp: 90 tablet, Rfl: 3 .  omeprazole (PRILOSEC) 10 MG capsule, Take 1 capsule (10 mg total) by mouth daily.,  Disp: 90 capsule, Rfl: 1 .  Probiotic Product (PROBIOTIC DAILY PO), Take 1 capsule by mouth daily. , Disp: , Rfl:  .  rosuvastatin (CRESTOR) 10 MG tablet, Take 1 tablet (10 mg total) by mouth daily., Disp: 90 tablet, Rfl: 3 .  venlafaxine XR (EFFEXOR-XR) 150 MG 24 hr capsule, Take 1 capsule (150 mg total) by mouth daily., Disp: 90 capsule, Rfl: 2 .  ezetimibe (ZETIA) 10 MG tablet, Take 1 tablet (10 mg total) by mouth daily. (Patient not taking: Reported on 01/08/2020), Disp: 90 tablet, Rfl: 3  Current Facility-Administered Medications:  .  0.9 %  sodium chloride infusion, 500 mL, Intravenous, Once, Lucio Edward T, MD .  0.9 %  sodium chloride infusion, 500 mL, Intravenous, Once, Lucio Edward T, MD   Observations/Objective: Blood pressure (!) 150/72, pulse 82, temperature 98.4 F (36.9 C), temperature source Temporal,  height 5' 4"  (1.626 m), weight 196 lb 8 oz (89.1 kg), SpO2 96 %.  Physical Exam Constitutional:      General: She is not in acute distress.    Appearance: Normal appearance. She is well-developed. She is not ill-appearing or toxic-appearing.  HENT:     Head: Normocephalic.     Right Ear: Hearing, tympanic membrane, ear canal and external ear normal.     Left Ear: Hearing, tympanic membrane, ear canal and external ear normal.     Nose: Nose normal.  Eyes:     General: Lids are normal. Lids are everted, no foreign bodies appreciated.     Conjunctiva/sclera: Conjunctivae normal.     Pupils: Pupils are equal, round, and reactive to light.  Neck:     Thyroid: No thyroid mass or thyromegaly.     Vascular: No carotid bruit.     Trachea: Trachea normal.  Cardiovascular:     Rate and Rhythm: Normal rate and regular rhythm.     Heart sounds: Normal heart sounds, S1 normal and S2 normal. No murmur heard.  No gallop.   Pulmonary:     Effort: Pulmonary effort is normal. No respiratory distress.     Breath sounds: Normal breath sounds. No wheezing, rhonchi or rales.  Chest:      Breasts: Breasts are symmetrical.        Right: Normal. No mass.        Left: Normal. No mass.  Abdominal:     General: Bowel sounds are normal. There is no distension or abdominal bruit.     Palpations: Abdomen is soft. There is no fluid wave or mass.     Tenderness: There is no abdominal tenderness. There is no guarding or rebound.     Hernia: No hernia is present.  Musculoskeletal:     Cervical back: Normal range of motion and neck supple.  Lymphadenopathy:     Cervical: No cervical adenopathy.  Skin:    General: Skin is warm and dry.     Findings: No rash.  Neurological:     Mental Status: She is alert.     Cranial Nerves: No cranial nerve deficit.     Sensory: No sensory deficit.  Psychiatric:        Mood and Affect: Mood is not anxious or depressed.        Speech: Speech normal.        Behavior: Behavior normal. Behavior is cooperative.        Judgment: Judgment normal.      Assessment and Plan The patient's preventative maintenance and recommended screening tests for an annual wellness exam were reviewed in full today. Brought up to date unless services declined.  Counselled on the importance of diet, exercise, and its role in overall health and mortality. The patient's FH and SH was reviewed, including their home life, tobacco status, and drug and alcohol status.   Vaccines:S/P 3 COVID vaccines, td,PNA and  Given flu HD today Pap/DVE: not indicated Mammo: 03/2019.. repeat scheduled Bone Density:osteopenia 2019, repeat in 5 years Colon:01/22/2020, repeat in 3 years, dr. Fuller Plan  Smoking Status:none ETOH/ drug use:1-2/n a day/none  Hep C: done  MDD (major depressive disorder), single episode, moderate (HCC) Stable control on effexor daily  Vitamin D deficiency Resolved on supplementation.  Aortic atherosclerosis  Risk factor reduction.  Hyperlipidemia  Add back zetia to crestor 10 mg.. work on low cholesterol diet, increase exercise and weight  loss.   Steatohepatitis Improving from last  year. On statin.  Hypertension  Elevated  BP  In office.. follow at home and call with measurements.       Eliezer Lofts, MD

## 2020-04-02 NOTE — Assessment & Plan Note (Signed)
Risk factor reduction.

## 2020-04-02 NOTE — Assessment & Plan Note (Signed)
Resolved on supplementation.

## 2020-04-02 NOTE — Assessment & Plan Note (Signed)
Add back zetia to crestor 10 mg.. work on low cholesterol diet, increase exercise and weight loss.

## 2020-04-02 NOTE — Addendum Note (Signed)
Addended by: Carter Kitten on: 04/02/2020 11:09 AM   Modules accepted: Orders

## 2020-04-06 ENCOUNTER — Other Ambulatory Visit: Payer: Self-pay

## 2020-04-06 ENCOUNTER — Ambulatory Visit
Admission: RE | Admit: 2020-04-06 | Discharge: 2020-04-06 | Disposition: A | Discharge status: Home / Self Care | Payer: PPO | Source: Ambulatory Visit | Attending: Family Medicine | Admitting: Family Medicine

## 2020-04-06 DIAGNOSIS — Z1231 Encounter for screening mammogram for malignant neoplasm of breast: Secondary | ICD-10-CM | POA: Diagnosis not present

## 2020-04-06 DIAGNOSIS — Z0000 Encounter for general adult medical examination without abnormal findings: Secondary | ICD-10-CM

## 2020-04-07 ENCOUNTER — Ambulatory Visit: Payer: PPO

## 2020-04-20 ENCOUNTER — Other Ambulatory Visit: Payer: Self-pay | Admitting: Family Medicine

## 2020-04-20 DIAGNOSIS — L570 Actinic keratosis: Secondary | ICD-10-CM | POA: Diagnosis not present

## 2020-04-20 DIAGNOSIS — X32XXXA Exposure to sunlight, initial encounter: Secondary | ICD-10-CM | POA: Diagnosis not present

## 2020-04-20 DIAGNOSIS — I10 Essential (primary) hypertension: Secondary | ICD-10-CM

## 2020-04-20 DIAGNOSIS — L538 Other specified erythematous conditions: Secondary | ICD-10-CM | POA: Diagnosis not present

## 2020-04-20 DIAGNOSIS — L820 Inflamed seborrheic keratosis: Secondary | ICD-10-CM | POA: Diagnosis not present

## 2020-04-20 DIAGNOSIS — L400 Psoriasis vulgaris: Secondary | ICD-10-CM | POA: Diagnosis not present

## 2020-04-22 ENCOUNTER — Other Ambulatory Visit: Payer: Self-pay | Admitting: *Deleted

## 2020-04-22 DIAGNOSIS — K2100 Gastro-esophageal reflux disease with esophagitis, without bleeding: Secondary | ICD-10-CM

## 2020-04-22 DIAGNOSIS — I10 Essential (primary) hypertension: Secondary | ICD-10-CM

## 2020-04-22 MED ORDER — AMLODIPINE BESYLATE 10 MG PO TABS: ORAL_TABLET | ORAL | 3 refills | Status: DC

## 2020-05-25 ENCOUNTER — Telehealth: Payer: Self-pay

## 2020-05-25 DIAGNOSIS — Z1152 Encounter for screening for COVID-19: Secondary | ICD-10-CM | POA: Diagnosis not present

## 2020-05-25 DIAGNOSIS — Z03818 Encounter for observation for suspected exposure to other biological agents ruled out: Secondary | ICD-10-CM | POA: Diagnosis not present

## 2020-05-25 NOTE — Telephone Encounter (Signed)
[facility] - Client TELEPHONE ADVICE RECORD [facility] Patient Name: Christy Freeman Gender: Female DOB: 10/15/1950 Age: 69 Y 10 M 19 D Return Phone Number: [phone] (Primary), [phone] (Secondary) Address[place] [address] Client [facility] - Client Client Site [place] - Day Physician Eliezer Lofts - MD Contact Type Call Who Is Calling Patient / Member / Family / Caregiver Call Type Triage / Clinical Relationship To Patient Self Return Phone Number [phone] (Primary) Chief Complaint Flu Symptom Reason for Call Symptomatic / Request for [place] states, pt is not feeling well, in line for covid 19/ flu test. What does pt do if she has flu, medicines and ect Translation No Nurse Assessment Nurse: Sumner Boast, RN, [place] D[place] Date/Time (Eastern Time): 05/25/2020 11:04:14 AM Confirm and document reason for call. If symptomatic, describe symptoms. ---Caller states she is not feeling well. States she is in line for COVID-19 test. She is having back pain, fever of 101.3, coughing, nasal congestion and body aches. Symptoms started today. Also has headache. Took Ibuprofen. Does the patient have any new or worsening symptoms? ---Yes Will a triage be completed? ---Yes Related visit to physician within the last 2 weeks? ---No Does the PT have any chronic conditions? (i.e. diabetes, asthma, this includes High risk factors for pregnancy, etc.) ---Yes List chronic conditions. ---Hypertension Is this a behavioral health or substance abuse call? ---No Guidelines Guideline Title Affirmed Question Affirmed Notes Nurse Date/Time (Eastern Time) Influenza - Seasonal [1] Fever > 101 F (38.3 C) AND [2] age > 28 years Sumner Boast, Valentino Nose 05/25/2020 11:11:24 AM Disp. Time Eilene Ghazi Time) Disposition Final User 05/25/2020 11:14:36 AM See HCP within 4 Hours (or  PCP triage) Yes Sumner Boast, RN, [place] Derry PLEASE NOTE: All timestamps contained within this report are represented as [place] Standard Time. CONFIDENTIALTY NOTICE: This fax transmission is intended only for the addressee. It contains information that is legally privileged, confidential or otherwise protected from use or disclosure. If you are not the intended recipient, you are strictly prohibited from reviewing, disclosing, copying using or disseminating any of this information or taking any action in reliance on or regarding this information. If you have received this fax in error, please notify us immediately by telephone so that we can arrange for its return to [place]. Phone: [phone], Toll-Free: [phone], Fax: [phone] Page: 2 of 2 Call Id: [id number] [place] Disagree/Comply Comply Caller Understands Yes PreDisposition Call Doctor Care Advice Given Per Guideline SEE HCP (OR PCP TRIAGE) WITHIN 4 HOURS: * IBUPROFEN (E.G., MOTRIN, ADVIL): Take 400 mg (two 200 mg pills) by mouth every 6 hours. The most you should take each day is 1,200 mg (six 200 mg pills), unless your doctor has told you to take more. CALL BACK IF: * You become worse CARE ADVICE given per Influenza - Seasonal (Adult) guideline. Referrals REFERRED TO PCP OFFICE

## 2020-05-25 NOTE — Telephone Encounter (Signed)
Per appt notes pt already has video appt scheduled with Dr Lorelei Pont on 05/26/20 at 8 AM.

## 2020-05-26 ENCOUNTER — Other Ambulatory Visit (INDEPENDENT_AMBULATORY_CARE_PROVIDER_SITE_OTHER): Payer: PPO | Admitting: Family Medicine

## 2020-05-26 ENCOUNTER — Other Ambulatory Visit: Payer: PPO

## 2020-05-26 ENCOUNTER — Telehealth (INDEPENDENT_AMBULATORY_CARE_PROVIDER_SITE_OTHER): Payer: PPO | Admitting: Family Medicine

## 2020-05-26 ENCOUNTER — Encounter: Payer: Self-pay | Admitting: Family Medicine

## 2020-05-26 ENCOUNTER — Other Ambulatory Visit: Payer: Self-pay

## 2020-05-26 VITALS — BP 148/79 | HR 80 | Temp 98.5°F | Ht 64.0 in

## 2020-05-26 DIAGNOSIS — M2550 Pain in unspecified joint: Secondary | ICD-10-CM

## 2020-05-26 DIAGNOSIS — R0981 Nasal congestion: Secondary | ICD-10-CM | POA: Diagnosis not present

## 2020-05-26 DIAGNOSIS — R509 Fever, unspecified: Secondary | ICD-10-CM

## 2020-05-26 LAB — POC INFLUENZA A&B (BINAX/QUICKVUE)
Influenza A, POC: NEGATIVE
Influenza B, POC: NEGATIVE

## 2020-05-26 NOTE — Progress Notes (Signed)
Christy Freeman T. Christy Feeser, MD [facility] [place] [facility] [place], [address] Phone: [phone]  FAX: [place] - 69 y.o. female  MRN [id number]  Date of Birth: Mar 03, 1951  Visit Date: 05/26/2020  PCP: Christy Sanders, MD  Referred by: Christy Sanders, MD Chief Complaint  Patient presents with  . Fever    Negative Covid home test yesterday-PCR at [facility] also negative yesterday  . Headache  . Generalized Body Aches  . Cough    Nasal Congestion     Virtual Visit via Video Note:  I connected with  Christy Freeman on 05/26/2020  8:00 AM EST by a video enabled telemedicine application and verified that I am speaking with the correct person using two identifiers.   Location patient: home computer, tablet, or smartphone Location provider: work or home office Consent: Verbal consent directly obtained from [employer]. Persons participating in the virtual visit: patient, provider  I discussed the limitations of evaluation and management by telemedicine and the availability of in person appointments. The patient expressed understanding and agreed to proceed.  History of Present Illness:  Yesterday morning at 4 AM onset of symptoms.  She had acute onset of fever, headache, polyarthralgia, and nasal congestion.  101.3, yesterday.  She has been immunized against COVID-19.  She denies GI symptoms, she denies loss of taste or smell.  She did due to over-the-counter Covid test, but these were less than 12 hours after onset of symptoms.  HA, congestion. OTC covid test, and PCR test was negative.   Immunization History  Administered Date(s) Administered  . Fluad Quad(high Dose 65+) 04/02/2020  . Influenza Split 04/15/2013  . Influenza, Seasonal, Injecte, Preservative Fre 07/03/2016  . Influenza,inj,Quad PF,6+ Mos 05/12/2014  . Influenza-Unspecified 04/06/2017, 05/23/2018, 02/21/2019   . PFIZER SARS-COV-2 Vaccination 07/10/2019, 07/31/2019, 02/29/2020  . Pneumococcal Conjugate-13 08/03/2015  . Pneumococcal Polysaccharide-23 03/31/2013, 03/26/2019  . Td 08/08/2012  . Zoster 05/12/2012  . Zoster Recombinat (Shingrix) 03/15/2017, 06/27/2018     Review of Systems as above: See pertinent positives and pertinent negatives per HPI No acute distress verbally   Observations/Objective/Exam:  An attempt was made to discern vital signs over the phone and per patient if applicable and possible.   General:    Alert, Oriented, appears well and in no acute distress  Pulmonary:     On inspection no signs of respiratory distress.  Psych / Neurological:     Pleasant and cooperative.  Assessment and Plan:    ICD-10-CM   1. Fever and chills  R50.9   2. Arthralgia, unspecified joint  M25.50   3. Nasal congestion  R09.81    Multiple symptoms, most consistent with influenza versus COVID-19.  False-negative rate is high in this timeframe.  Recommend COVID-19 testing 3 to 5 days from onset of symptoms.  [facility] get her to do influenza testing today.  I discussed the assessment and treatment plan with the patient. The patient was provided an opportunity to ask questions and all were answered. The patient agreed with the plan and demonstrated an understanding of the instructions.   The patient was advised to call back or seek an in-person evaluation if the symptoms worsen or if the condition fails to improve as anticipated.  Follow-up: prn unless noted otherwise below No follow-ups on file.  No orders of the defined types were placed in this encounter.  No orders of the  defined types were placed in this encounter.   Signed,  Maud Deed. Kellis Mcadam, MD

## 2020-07-05 ENCOUNTER — Telehealth: Payer: Self-pay | Admitting: Family Medicine

## 2020-07-05 DIAGNOSIS — L400 Psoriasis vulgaris: Secondary | ICD-10-CM | POA: Diagnosis not present

## 2020-07-05 DIAGNOSIS — Z711 Person with feared health complaint in whom no diagnosis is made: Secondary | ICD-10-CM | POA: Diagnosis not present

## 2020-07-05 NOTE — Telephone Encounter (Signed)
Refill was sent today at 10:01 am.

## 2020-07-05 NOTE — Telephone Encounter (Signed)
Pt called in wanted to know about getting a refill venlafaxine

## 2020-07-05 NOTE — Telephone Encounter (Signed)
Is there suppose to be message with this??

## 2020-07-21 ENCOUNTER — Other Ambulatory Visit: Payer: Self-pay | Admitting: Family Medicine

## 2020-07-21 DIAGNOSIS — K2100 Gastro-esophageal reflux disease with esophagitis, without bleeding: Secondary | ICD-10-CM

## 2020-07-27 DIAGNOSIS — D225 Melanocytic nevi of trunk: Secondary | ICD-10-CM | POA: Diagnosis not present

## 2020-07-27 DIAGNOSIS — D2261 Melanocytic nevi of right upper limb, including shoulder: Secondary | ICD-10-CM | POA: Diagnosis not present

## 2020-07-27 DIAGNOSIS — L570 Actinic keratosis: Secondary | ICD-10-CM | POA: Diagnosis not present

## 2020-07-27 DIAGNOSIS — D2272 Melanocytic nevi of left lower limb, including hip: Secondary | ICD-10-CM | POA: Diagnosis not present

## 2020-07-27 DIAGNOSIS — L400 Psoriasis vulgaris: Secondary | ICD-10-CM | POA: Diagnosis not present

## 2020-07-27 DIAGNOSIS — D2262 Melanocytic nevi of left upper limb, including shoulder: Secondary | ICD-10-CM | POA: Diagnosis not present

## 2020-08-03 ENCOUNTER — Telehealth: Payer: Self-pay | Admitting: *Deleted

## 2020-08-03 MED ORDER — PROMETHAZINE HCL 25 MG RE SUPP: 25.0000 mg | Freq: Four times a day (QID) | RECTAL | 0 refills | Status: DC | PRN

## 2020-08-03 NOTE — Telephone Encounter (Signed)
Rx for phenergan suppositories sent to pharmacy listed.

## 2020-08-03 NOTE — Telephone Encounter (Signed)
Patient notified as instructed by telephone and she verbalized understanding. Patient stated that she had tolerated ice chips and took a few sips of ginger ale and threw that up. Patient wanted to know if you should go ahead and send some suppositories in just in case she needs them later today. Patient stated that she did a home covid test and it was negative. Patient was given ER precautions and she verbalized understanding.  Pharmacy [facility].

## 2020-08-03 NOTE — Telephone Encounter (Signed)
Patient notified as instructed by telephone and verbalized understanding. Patient stated that she has been able to keep ice chips down for hours.  Patient stated that she sent Dr. Diona Browner a mychart message with additional information in it. Advised patient that we will make sure that she gets the my chart message.

## 2020-08-03 NOTE — Telephone Encounter (Signed)
Patient notified that suppositories have been sent in.  Patient stated that she had taken a nap and when she first got up her temperature was 102.0 but has gradually come down to 99.4. Patient stated that she was all snuggled up in blankets and thinks that may be why her temperature was high. Patient stated that she just took her blood pressure and it was 161/86 and heart rate 103.

## 2020-08-03 NOTE — Telephone Encounter (Signed)
Noted.  If not keeping down any fluids in next 1-2 hours, persistent fever, severe abdominal pain.Christy Freeman go to ER.  If keeping down fluids for several hours but  BP staying up > 150/90  .. take remainder of BP meds ( second 1/2 tab of amlodipine and toprol Xl)

## 2020-08-03 NOTE — Telephone Encounter (Signed)
Patient called stating that she started throwing up around 7:30 this morning. Patient stated when she woke up this morning she felt a little nauseated. Patient stated that she ate some tomatos with salt and a piece of bread then took her medications around 6:30 am. Patient stated that she started throwing up around 7:30 and threw up at 8:30 and then at 9:30. Patient stated that she has had chills and loose stools. Patient denies abdominal pain but stated that she feels that she is going to have diarrhea. Patient denies cough, sore throat, body aches or congestion. Patient stated that she is not sure how much of her blood pressure medication got into her system before throwing up. Patient stated her blood pressure at 10:00 am was 161/87 and while on the phone she rechecked it and got 163/98. Patient wants to know if she should take her blood pressure medication again. Patient stated that she has been able to tolerate some ice chips now for about an hour. Patient stated that she has some home coivd test and plans on doing a covid test after she hangs up. Advised patient to continue to try small amounts of ice chips if she is able to tolerate them.

## 2020-08-03 NOTE — Telephone Encounter (Signed)
Only retake 1/2 dose of amlodipine now.  Follow BP closely today. Titrate fluids up as tolerated.   I can send in nausea suppositories if emesis not stopping though the day.. let me know.

## 2020-08-10 DIAGNOSIS — L570 Actinic keratosis: Secondary | ICD-10-CM | POA: Diagnosis not present

## 2020-08-24 ENCOUNTER — Other Ambulatory Visit: Payer: Self-pay | Admitting: Family Medicine

## 2020-08-24 DIAGNOSIS — I10 Essential (primary) hypertension: Secondary | ICD-10-CM

## 2020-09-15 DIAGNOSIS — L570 Actinic keratosis: Secondary | ICD-10-CM | POA: Diagnosis not present

## 2020-09-15 DIAGNOSIS — L578 Other skin changes due to chronic exposure to nonionizing radiation: Secondary | ICD-10-CM | POA: Diagnosis not present

## 2020-09-15 DIAGNOSIS — Z872 Personal history of diseases of the skin and subcutaneous tissue: Secondary | ICD-10-CM | POA: Diagnosis not present

## 2020-09-15 DIAGNOSIS — L400 Psoriasis vulgaris: Secondary | ICD-10-CM | POA: Diagnosis not present

## 2020-09-15 DIAGNOSIS — D1801 Hemangioma of skin and subcutaneous tissue: Secondary | ICD-10-CM | POA: Diagnosis not present

## 2020-09-15 DIAGNOSIS — L738 Other specified follicular disorders: Secondary | ICD-10-CM | POA: Diagnosis not present

## 2020-09-16 ENCOUNTER — Other Ambulatory Visit: Payer: Self-pay

## 2020-09-16 ENCOUNTER — Ambulatory Visit (INDEPENDENT_AMBULATORY_CARE_PROVIDER_SITE_OTHER): Payer: PPO | Admitting: Primary Care

## 2020-09-16 DIAGNOSIS — N63 Unspecified lump in unspecified breast: Secondary | ICD-10-CM | POA: Diagnosis not present

## 2020-09-16 NOTE — Addendum Note (Signed)
Addended by: Francella Solian on: 09/16/2020 10:24 AM   Modules accepted: Orders

## 2020-09-16 NOTE — Progress Notes (Signed)
Subjective:    Patient ID: Christy Freeman, female    DOB: 04-21-1951, 70 y.o.   MRN: [id number]  HPI  Vale Peraza is a very pleasant 70 y.o. female patient of Dr. Diona Browner with a history of recurrent pneumonia, pulmonary nodule, obesity who presents today with a chief complaint of breast mass.  Her mass is located to the left breast at the 12 o'clock position for which she first noticed one week ago. She was playing with her dogs on the floor before she noticed the mass. She did notice some tenderness and a bruise to the site. Over the last week she believes that the size of the mass has diminished.  She underwent screening mammogram in November 2021, BI-RADS 1. She denies a family history of breast cancer. She does have a personal history of cystic and dense breast tissue.    Review of Systems  Genitourinary:       Breast mass  Skin: Negative for color change.         Past Medical History:  Diagnosis Date  . Allergy    seasonal allergies  . Anxiety    hx- on meds  . Arthritis    Right hand  . Blood transfusion without reported diagnosis 1989  . Cancer ([place])    basal cell on face  . Cataract   . Colon polyps   . GERD (gastroesophageal reflux disease)   . H/O: depression   . Hypercholesterolemia    diet controlled  . Hypertension   . Lung nodule    left lung     Social History   Socioeconomic History  . Marital status: Married    Spouse name: Eulas Post  . Number of children: 0  . Years of education: Not on file  . Highest education level: Not on file  Occupational History    Comment:  chemist  Tobacco Use  . Smoking status: Never Smoker  . Smokeless tobacco: Never Used  Vaping Use  . Vaping Use: Never used  Substance and Sexual Activity  . Alcohol use: Yes    Alcohol/week: 14.0 standard drinks    Types: 6 Glasses of wine, 8 Standard drinks or equivalent per week    Comment: 2 glasses of wine per night  . Drug use: No  . Sexual activity: Yes   Other Topics Concern  . Not on file  Social History Narrative   She would desire CPR.   Does have a living will.   Social Determinants of Health   Financial Resource Strain: Not on file  Food Insecurity: Not on file  Transportation Needs: Not on file  Physical Activity: Not on file  Stress: Not on file  Social Connections: Not on file  Intimate Partner Violence: Not on file    Past Surgical History:  Procedure Laterality Date  . APPENDECTOMY  06/14/2017  . COLONOSCOPY  2016   TA x 1  . CYST REMOVAL HAND Left   . DILATION AND CURETTAGE OF UTERUS    . LAPAROSCOPIC APPENDECTOMY N/A 06/14/2017   Procedure: APPENDECTOMY LAPAROSCOPIC;  Surgeon: Clayburn Pert, MD;  Location: [facility] ORS;  Service: General;  Laterality: N/A;  . POLYPECTOMY  2016   TA    Family History  Problem Relation Age of Onset  . Diabetes Mother   . Heart failure Mother   . Hypertension Mother   . Stroke Father   . Hypertension Father   . Heart disease Brother   . Stroke Maternal Grandmother   .  Colon cancer Neg Hx   . Colon polyps Neg Hx   . Esophageal cancer Neg Hx   . Stomach cancer Neg Hx   . Rectal cancer Neg Hx     No Known Allergies  Current Outpatient Medications on File Prior to Visit  Medication Sig Dispense Refill  . amLODipine (NORVASC) 10 MG tablet TAKE 1 TABLET(10 MG) BY MOUTH DAILY 90 tablet 3  . aspirin EC 81 MG tablet Take 81 mg by mouth daily. Swallow whole.    . cetirizine (ZYRTEC) 10 MG tablet Take 10 mg by mouth daily.    . Cholecalciferol (VITAMIN D3 PO) Take by mouth.    . clobetasol ointment (TEMOVATE) 0.05 %     . ketoconazole (NIZORAL) 2 % shampoo Apply topically.    Marland Kitchen lisinopril (ZESTRIL) 40 MG tablet TAKE 1 TABLET(40 MG) BY MOUTH DAILY 90 tablet 1  . magnesium 30 MG tablet Take 30 mg by mouth 2 (two) times daily.    . metoprolol succinate (TOPROL-XL) 100 MG 24 hr tablet TAKE 1 TABLET BY MOUTH ONCE DAILY EITHER WITH OR IMMEDIATELY FOLLOWING A MEAL 90 tablet 1  .  omeprazole (PRILOSEC) 10 MG capsule TAKE 1 CAPSULE(10 MG) BY MOUTH DAILY 90 capsule 1  . Probiotic Product (PROBIOTIC DAILY PO) Take 1 capsule by mouth daily.     . rosuvastatin (CRESTOR) 10 MG tablet Take 1 tablet (10 mg total) by mouth daily. 90 tablet 3  . venlafaxine XR (EFFEXOR-XR) 150 MG 24 hr capsule TAKE 1 CAPSULE(150 MG) BY MOUTH DAILY 90 capsule 1  . ezetimibe (ZETIA) 10 MG tablet Take 1 tablet (10 mg total) by mouth daily. (Patient not taking: Reported on 01/08/2020) 90 tablet 3  . promethazine (PHENERGAN) 25 MG suppository Place 1 suppository (25 mg total) rectally every 6 (six) hours as needed for nausea or vomiting. (Patient not taking: Reported on 09/16/2020) 12 each 0   Current Facility-Administered Medications on File Prior to Visit  Medication Dose Route Frequency Provider Last Rate Last Admin  . 0.9 %  sodium chloride infusion  500 mL Intravenous Once Lucio Edward T, MD      . 0.9 %  sodium chloride infusion  500 mL Intravenous Once Ladene Artist, MD        BP (!) 180/90   Pulse 94   Temp 98.1 F (36.7 C) (Temporal)   Ht 5' 4"  (1.626 m)   Wt 197 lb 12 oz (89.7 kg)   SpO2 97%   BMI 33.94 kg/m  Objective:   Physical Exam Pulmonary:     Effort: Pulmonary effort is normal.  Chest:  Breasts:     Left: Mass present.        Comments: 0.25 cm, soft, immobile, non tender mass to left breast at 11- 12 o'clock position. Skin:    General: Skin is warm and dry.     Findings: No erythema.     Comments: Healing bruising noted to the abdomen, no bruising or skin texture changes noted to the breasts.   Neurological:     Mental Status: She is alert.           Assessment & Plan:      This visit occurred during the SARS-CoV-2 public health emergency.  Safety protocols were in place, including screening questions prior to the visit, additional usage of staff PPE, and extensive cleaning of exam room while observing appropriate contact time as indicated for  disinfecting solutions.

## 2020-09-16 NOTE — Assessment & Plan Note (Signed)
0.25 cm, soft, immobile, non tender mass to left breast at 11- 12 o'clock position.  Normal mammogram in November 2021 but given findings on exam today we will proceed with diagnostic mammogram with ultrasound.   Orders placed.

## 2020-09-16 NOTE — Patient Instructions (Signed)
You will be contacted regarding your mammogram and ultrasound.  Please let us know if you have not been contacted within a few days.  It was a pleasure meeting you!

## 2020-09-23 ENCOUNTER — Ambulatory Visit
Admission: RE | Admit: 2020-09-23 | Discharge: 2020-09-23 | Disposition: A | Discharge status: Home / Self Care | Payer: PPO | Source: Ambulatory Visit | Attending: Primary Care | Admitting: Primary Care

## 2020-09-23 ENCOUNTER — Other Ambulatory Visit: Payer: Self-pay

## 2020-09-23 DIAGNOSIS — N6322 Unspecified lump in the left breast, upper inner quadrant: Secondary | ICD-10-CM | POA: Diagnosis not present

## 2020-09-23 DIAGNOSIS — N6321 Unspecified lump in the left breast, upper outer quadrant: Secondary | ICD-10-CM | POA: Diagnosis not present

## 2020-09-23 DIAGNOSIS — R922 Inconclusive mammogram: Secondary | ICD-10-CM | POA: Diagnosis not present

## 2020-09-23 DIAGNOSIS — N63 Unspecified lump in unspecified breast: Secondary | ICD-10-CM

## 2020-09-28 ENCOUNTER — Other Ambulatory Visit: Payer: Self-pay | Admitting: Primary Care

## 2020-09-28 DIAGNOSIS — N6320 Unspecified lump in the left breast, unspecified quadrant: Secondary | ICD-10-CM

## 2020-10-19 ENCOUNTER — Other Ambulatory Visit: Payer: Self-pay | Admitting: Family Medicine

## 2020-10-19 DIAGNOSIS — I10 Essential (primary) hypertension: Secondary | ICD-10-CM

## 2020-10-20 NOTE — Telephone Encounter (Signed)
Pharmacy requests refill on: Lisinopril 40 mg   LAST REFILL: 04/20/2020 (Q-90, R-1) LAST OV: 04/02/2020 NEXT OV: Not Scheduled  PHARMACY: [facility] [place], [place]

## 2020-10-22 ENCOUNTER — Other Ambulatory Visit: Payer: PPO

## 2020-10-26 ENCOUNTER — Other Ambulatory Visit: Payer: Self-pay

## 2020-10-26 ENCOUNTER — Encounter: Payer: Self-pay | Admitting: Cardiovascular Disease

## 2020-10-26 ENCOUNTER — Ambulatory Visit: Payer: PPO | Admitting: Cardiovascular Disease

## 2020-10-26 VITALS — BP 142/82 | HR 83 | Ht 64.0 in | Wt 196.1 lb

## 2020-10-26 DIAGNOSIS — E785 Hyperlipidemia, unspecified: Secondary | ICD-10-CM

## 2020-10-26 DIAGNOSIS — I10 Essential (primary) hypertension: Secondary | ICD-10-CM | POA: Diagnosis not present

## 2020-10-26 DIAGNOSIS — I2510 Atherosclerotic heart disease of native coronary artery without angina pectoris: Secondary | ICD-10-CM

## 2020-10-26 DIAGNOSIS — I700 Atherosclerosis of aorta: Secondary | ICD-10-CM | POA: Diagnosis not present

## 2020-10-26 NOTE — Progress Notes (Signed)
Date:  10/26/2020 home ID:  Jax, Abdelrahman [birth year], MRN [id number]  Patient Location:  [place]  [id number]   Provider location:   [facility], [place] office  PCP:  Jinny Sanders, MD  Cardiologist:  Patsy Baltimore   Chief Complaint  Patient presents with  . 12 month follow up     "doing well." Medications reviewed by the patient verbally.     History of Present Illness:    Christy Freeman is a 70 y.o. female past medical history of fatty liver  LDL 190-200 mg/dL,  Previous evaluation in the ER following chest pains, which were ultimately felt to be due to noncardiac chest pain, poorly controlled blood pressure,  Stress test negative.   aortic atherosclerosis , mild coronary calcification on prior CT scan She presents for evaluation of hyperlipidemia, aortic atherosclerosis  Last clinic visit with myself May 2021 [place] today,  Reports she has not been monitoring blood pressure at home Has been compliant with her medications Tolerating Crestor and Zetia No regular exercise Overall no complaints  Leg swelling stable, does not wear compression hose  Lab work reviewed  total cholesterol 214 LDL 115 Normal renal function Scheduled for new lab work with her on [place] extremity edema work-up reviewed Seen by vascular June 2021, blood pressure elevated on that visit 650 systolic It was recommended she wear compression hose with plan for reevaluation in several months time  Ultrasound ordered lower extremity venous No DVT, there was evidence of venous reflux bilaterally   Right:  - Venous reflux is noted in the right sapheno-femoral junction.  Left:  - Venous reflux is noted in the left common femoral vein.  - Venous reflux is noted in the left sapheno-femoral junction.   CT ABD 03/2019 reviewed 1. Mildly enlarged and steatotic liver. 2. Cholelithiasis. 3.  Aortic atherosclerosis  EKG personally  reviewed by myself on todays visit Shows normal sinus rhythm rate 88 bpm no significant ST-T wave changes    Other past medical history reviewed  CT scan chest showed no coronary calcifications, minimal descending aorta calcification, none in the proximal carotid arteries.  Family history: Mother (MI at 42 y.o. ; Diabetes at age 16 y.o.). Father (TIA at 46 y.o.); Brother (MI at 26 y.o.) Social History: Drinks wine 4-6 glasses per week Previously drank 4 nights per week. She stopped 2009-2011 after seeing fatty liver results, but restarted in 2011 after stress levels increased. No tobacco use.   Liver history: Patient diagnosed with fatty liver on U/S 06/2007 by Dr. Gaye Pollack [facility], [place]). Her LFTs went all the way to normal she tells me once she stopped drinking alcohol in 2009 - then went up after restating alcohol. He checks LFTs annually. ALT/AST were in the 90's in 03/06/13, then improved to 40's late 03/24/13 after she reduced her alcohol consumption from 4 days per week down to 2 nights per week (2-3 glasses per day).    Prior CV studies:   The following studies were reviewed today:    Past Medical History:  Diagnosis Date  . Allergy    seasonal allergies  . Anxiety    hx- on meds  . Arthritis    Right hand  . Blood transfusion without reported diagnosis 1989  . Cancer ([place])    basal cell on face  . Cataract   . Colon polyps   . GERD (gastroesophageal reflux disease)   . H/O:  depression   . Hypercholesterolemia    diet controlled  . Hypertension   . Lung nodule    left lung   . Psoriasis    Past Surgical History:  Procedure Laterality Date  . APPENDECTOMY  06/14/2017  . COLONOSCOPY  2016   TA x 1  . CYST REMOVAL HAND Left   . DILATION AND CURETTAGE OF UTERUS    . LAPAROSCOPIC APPENDECTOMY N/A 06/14/2017   Procedure: APPENDECTOMY LAPAROSCOPIC;  Surgeon: Clayburn Pert, MD;  Location: [facility] ORS;  Service: General;  Laterality: N/A;  . POLYPECTOMY  2016    TA     Current Meds  Medication Sig  . amLODipine (NORVASC) 10 MG tablet TAKE 1 TABLET(10 MG) BY MOUTH DAILY  . aspirin EC 81 MG tablet Take 81 mg by mouth daily. Swallow whole.  . cetirizine (ZYRTEC) 10 MG tablet Take 10 mg by mouth daily.  . Cholecalciferol (VITAMIN D3 PO) Take by mouth.  . clobetasol ointment (TEMOVATE) 0.05 %   . ezetimibe (ZETIA) 10 MG tablet Take 1 tablet (10 mg total) by mouth daily.  Marland Kitchen ketoconazole (NIZORAL) 2 % shampoo Apply topically.  Marland Kitchen lisinopril (ZESTRIL) 40 MG tablet TAKE 1 TABLET(40 MG) BY MOUTH DAILY  . magnesium 30 MG tablet Take 30 mg by mouth 2 (two) times daily.  . metoprolol succinate (TOPROL-XL) 100 MG 24 hr tablet TAKE 1 TABLET BY MOUTH ONCE DAILY EITHER WITH OR IMMEDIATELY FOLLOWING A MEAL  . omeprazole (PRILOSEC) 10 MG capsule TAKE 1 CAPSULE(10 MG) BY MOUTH DAILY  . Probiotic Product (PROBIOTIC DAILY PO) Take 1 capsule by mouth daily.   . rosuvastatin (CRESTOR) 10 MG tablet Take 1 tablet (10 mg total) by mouth daily.  Marland Kitchen venlafaxine XR (EFFEXOR-XR) 150 MG 24 hr capsule TAKE 1 CAPSULE(150 MG) BY MOUTH DAILY   Current Facility-Administered Medications for the 10/26/20 encounter (Office Visit) with Minna Merritts, MD  Medication  . 0.9 %  sodium chloride infusion  . 0.9 %  sodium chloride infusion     Allergies:   Patient has no known allergies.   Social History   Tobacco Use  . Smoking status: Never Smoker  . Smokeless tobacco: Never Used  Vaping Use  . Vaping Use: Never used  Substance Use Topics  . Alcohol use: Yes    Alcohol/week: 14.0 standard drinks    Types: 6 Glasses of wine, 8 Standard drinks or equivalent per week    Comment: 2 glasses of wine per night  . Drug use: No     Family Hx: The patient's family history includes Breast cancer (age of onset: 2) in an other family member; Diabetes in her mother; Heart disease in her brother; Heart failure in her mother; Hypertension in her father and mother; Stroke in her father  and maternal grandmother. There is no history of Colon cancer, Colon polyps, Esophageal cancer, Stomach cancer, or Rectal cancer.  ROS:   Please see the history of present illness.    Review of Systems  Constitutional: Negative.   HENT: Negative.   Respiratory: Negative.   Cardiovascular: Negative.   Gastrointestinal: Negative.   Musculoskeletal: Negative.   Neurological: Negative.   Psychiatric/Behavioral: Negative.   All other systems reviewed and are negative.    Labs/Other Tests and Data Reviewed:    Recent Labs: 03/26/2020: ALT 35; BUN 11; Creatinine, Ser 0.63; Potassium 4.7; Sodium 136   Recent Lipid Panel Lab Results  Component Value Date/Time   CHOL 214 (H) 03/26/2020 08:06 AM  CHOL 258 (H) 01/28/2015 08:52 AM   TRIG 126.0 03/26/2020 08:06 AM   HDL 74.10 03/26/2020 08:06 AM   HDL 68 01/28/2015 08:52 AM   CHOLHDL 3 03/26/2020 08:06 AM   LDLCALC 115 (H) 03/26/2020 08:06 AM   LDLCALC 154 (H) 01/28/2015 08:52 AM   LDLDIRECT 191.0 07/30/2014 02:01 PM    Wt Readings from Last 3 Encounters:  10/26/20 196 lb 2 oz (89 kg)  09/16/20 197 lb 12 oz (89.7 kg)  04/02/20 196 lb 8 oz (89.1 kg)     Exam:    Vital Signs: Vital signs may also be detailed in the HPI BP (!) 142/82 (BP Location: Left Arm, Patient Position: Sitting, Cuff Size: Normal)   Pulse 83   Ht 5' 4"  (1.626 m)   Wt 196 lb 2 oz (89 kg)   SpO2 98%   BMI 33.66 kg/m    Constitutional:  oriented to person, place, and time. No distress.  HENT:  Head: Grossly normal Eyes:  no discharge. No scleral icterus.  Neck: No JVD, no carotid bruits  Cardiovascular: Regular rate and rhythm, no murmurs appreciated Pulmonary/Chest: Clear to auscultation bilaterally, no wheezes or rails Abdominal: Soft.  no distension.  no tenderness.  Musculoskeletal: Normal range of motion Neurological:  normal muscle tone. Coordination normal. No atrophy Skin: Skin warm and dry Psychiatric: normal affect, pleasant   ASSESSMENT  & PLAN:    Aortic atherosclerosis (H[facility]) Mild to moderate distal aortic atherosclerosis  On crestor/zetia  Essential hypertension Blood pressure is well controlled on today's visit. No changes made to the medications.  Hyponatremia Chronically borderline low, we will not use HCTZ on a regular basis Avoid excessive free water Last sodium normal  Gastroesophageal reflux disease with esophagitis Stable symptoms  Hyperlipidemia, unspecified hyperlipidemia type Lipid check later this year  Obesity Recommend regular walking program  Fatty liver/NASH Discussed, recommended walking program, low carbohydrates  Leg swelling Stable    Total encounter time more than 35 minutes  Greater than 50% was spent in counseling and coordination of care with the patient    Signed, Ida Rogue, MD  10/26/2020 8:26 AM    [place] [facility] [address], [place], [place] [address]

## 2020-10-26 NOTE — Patient Instructions (Signed)
Medication Instructions:  No changes  If you need a refill on your cardiac medications before your next appointment, please call your pharmacy.    Lab work: No new labs needed   If you have labs (blood work) drawn today and your tests are completely normal, you will receive your results only by: . MyChart Message (if you have MyChart) OR . A paper copy in the mail If you have any lab test that is abnormal or we need to change your treatment, we will call you to review the results.   Testing/Procedures: No new testing needed   Follow-Up: At [facility], you and your health needs are our priority.  As part of our continuing mission to provide you with exceptional heart care, we have created designated Provider Care Teams.  These Care Teams include your primary Cardiologist (physician) and Advanced Practice Providers (APPs -  Physician Assistants and Nurse Practitioners) who all work together to provide you with the care you need, when you need it.  . You will need a follow up appointment in 12 months  . Providers on your designated Care Team:   . Christopher Berge, NP . Ryan Dunn, PA-C . Jacquelyn Visser, PA-C  Any Other Special Instructions Will Be Listed Below (If Applicable).  COVID-19 Vaccine Information can be found at: [email or web address] For questions related to vaccine distribution or appointments, please email [email or web address] or call [phone].     

## 2020-10-28 ENCOUNTER — Other Ambulatory Visit: Payer: Self-pay | Admitting: Cardiovascular Disease

## 2020-10-28 DIAGNOSIS — L400 Psoriasis vulgaris: Secondary | ICD-10-CM | POA: Diagnosis not present

## 2020-11-27 ENCOUNTER — Other Ambulatory Visit: Payer: Self-pay

## 2020-11-27 ENCOUNTER — Ambulatory Visit
Admission: RE | Admit: 2020-11-27 | Discharge: 2020-11-27 | Disposition: A | Discharge status: Home / Self Care | Payer: PPO | Source: Ambulatory Visit | Attending: Sports Medicine | Admitting: Sports Medicine

## 2020-11-27 VITALS — BP 154/80 | HR 90 | Temp 100.2°F | Resp 16 | Ht 64.0 in | Wt 190.0 lb

## 2020-11-27 DIAGNOSIS — Z7982 Long term (current) use of aspirin: Secondary | ICD-10-CM | POA: Diagnosis not present

## 2020-11-27 DIAGNOSIS — R519 Headache, unspecified: Secondary | ICD-10-CM | POA: Diagnosis not present

## 2020-11-27 DIAGNOSIS — W57XXXA Bitten or stung by nonvenomous insect and other nonvenomous arthropods, initial encounter: Secondary | ICD-10-CM | POA: Diagnosis not present

## 2020-11-27 DIAGNOSIS — S0086XA Insect bite (nonvenomous) of other part of head, initial encounter: Secondary | ICD-10-CM | POA: Insufficient documentation

## 2020-11-27 DIAGNOSIS — R630 Anorexia: Secondary | ICD-10-CM

## 2020-11-27 DIAGNOSIS — B349 Viral infection, unspecified: Secondary | ICD-10-CM | POA: Insufficient documentation

## 2020-11-27 DIAGNOSIS — R110 Nausea: Secondary | ICD-10-CM | POA: Insufficient documentation

## 2020-11-27 DIAGNOSIS — Z20822 Contact with and (suspected) exposure to covid-19: Secondary | ICD-10-CM | POA: Insufficient documentation

## 2020-11-27 DIAGNOSIS — M791 Myalgia, unspecified site: Secondary | ICD-10-CM | POA: Diagnosis not present

## 2020-11-27 DIAGNOSIS — R509 Fever, unspecified: Secondary | ICD-10-CM | POA: Insufficient documentation

## 2020-11-27 DIAGNOSIS — R6883 Chills (without fever): Secondary | ICD-10-CM

## 2020-11-27 DIAGNOSIS — Z79899 Other long term (current) drug therapy: Secondary | ICD-10-CM | POA: Diagnosis not present

## 2020-11-27 LAB — INFLUENZA A AND B ANTIGEN (CONVERTED LAB)
INFLUENZA A ANTIGEN, POC: NEGATIVE
INFLUENZA B ANTIGEN, POC: NEGATIVE

## 2020-11-27 MED ORDER — DOXYCYCLINE HYCLATE 100 MG PO CAPS: 100.0000 mg | ORAL_CAPSULE | Freq: Two times a day (BID) | ORAL | 0 refills | Status: DC

## 2020-11-27 MED ORDER — ONDANSETRON HCL 4 MG PO TABS: 4.0000 mg | ORAL_TABLET | Freq: Four times a day (QID) | ORAL | 0 refills | Status: DC

## 2020-11-27 NOTE — ED Provider Notes (Signed)
[facility]    CSN: [id number] Arrival date & time: 11/27/20  1356      History   Chief Complaint Chief Complaint  Patient presents with   Insect Bite    HPI Christy Freeman is a 70 y.o. female.   Patient is a pleasant 70 year old female who presents for evaluation of the above issue.  Normally is seen by Maryanna Shape and [facility] for ongoing medical care.  She is retired and does not work outside the home.  She has a fairly long protracted history that is as follows:  She reports noticing a tick on her chin on November 14, 2020.  She thinks that it was a dog tick.  She is unsure how long it was attached.  She has had a persistent red area near that tick bite since she had the tick exposure.  She said that it has been red with some thinning of the skin and some pustules in that area.  She has been using Neosporin and hydrocortisone with limited success.  She said she also got stung by a tiny parasitic wasp on 17 June.  She thinks that is calm down now.  On November 24, 2020 she had a wave of nausea and then noticed a fever that was as high as 102.4.  She wonders if she had gotten food poisoning.  She had no vomiting or diarrhea.  She had intermittent fevers that responded well to Tylenol but came back shortly thereafter once the medicine wore off.  2 days ago she continued to have swinging fevers and it was now accompanied by a headache that was fairly severe.  T-max was up to 102.  She continued to take Tylenol.  Yesterday her T-max was 103.4 and again she was taking Tylenol every 4 hours.  This morning she started having shivering and felt cold at times.  She also had some sweats and soaked through the bed sheets and that necessitated her coming here for evaluation.  She did have a negative COVID test done at home that about 3 hours prior to arrival.  She continues to have very limited appetite.  She denies any chest pain or shortness of breath.  No urinary symptoms.  She has not  been seen or evaluated by a healthcare provider if she felt that her symptoms would improve but since they have not she comes into the urgent care for initial evaluation today.   Past Medical History:  Diagnosis Date   Allergy    seasonal allergies   Anxiety    hx- on meds   Arthritis    Right hand   Blood transfusion without reported diagnosis 1989   Cancer ([place])    basal cell on face   Cataract    Colon polyps    GERD (gastroesophageal reflux disease)    H/O: depression    Hypercholesterolemia    diet controlled   Hypertension    Lung nodule    left lung    Psoriasis     Patient Active Problem List   Diagnosis Date Noted   Breast mass 09/16/2020   Venous stasis dermatitis of both lower extremities 11/19/2019   Osteopenia after menopause 03/26/2019   Well woman exam without gynecological exam 03/26/2019   Recurrent pneumonia 09/05/2018   Vitamin D deficiency 09/25/2017   Aortic atherosclerosis ([place]) 02/10/2015   Solitary pulmonary nodule 10/13/2013   Obesity 09/19/2013   Steatohepatitis 09/19/2013   Hypertension 02/05/2013   GERD (gastroesophageal reflux disease)  02/05/2013   Hyperlipidemia 02/05/2013   MDD (major depressive disorder), single episode, moderate ([place]) 02/05/2013    Past Surgical History:  Procedure Laterality Date   APPENDECTOMY  06/14/2017   COLONOSCOPY  2016   TA x 1   CYST REMOVAL HAND Left    DILATION AND CURETTAGE OF UTERUS     LAPAROSCOPIC APPENDECTOMY N/A 06/14/2017   Procedure: APPENDECTOMY LAPAROSCOPIC;  Surgeon: Clayburn Pert, MD;  Location: [facility] ORS;  Service: General;  Laterality: N/A;   POLYPECTOMY  2016   TA    OB History   No obstetric history on file.      Home Medications    Prior to Admission medications   Medication Sig Start Date End Date Taking? Authorizing Provider  amLODipine (NORVASC) 10 MG tablet TAKE 1 TABLET(10 MG) BY MOUTH DAILY 04/22/20  Yes Bedsole, Amy E, MD  aspirin EC 81 MG tablet Take 81 mg by  mouth daily. Swallow whole.   Yes [provider]  cetirizine (ZYRTEC) 10 MG tablet Take 10 mg by mouth daily.   Yes [provider]  Cholecalciferol (VITAMIN D3 PO) Take by mouth.   Yes [provider]  clobetasol ointment (TEMOVATE) 0.05 %  08/26/19  Yes [provider]  doxycycline (VIBRAMYCIN) 100 MG capsule Take 1 capsule (100 mg total) by mouth 2 (two) times daily. 11/27/20  Yes Verda Cumins, MD  ezetimibe (ZETIA) 10 MG tablet TAKE 1 TABLET BY MOUTH DAILY 10/28/20  Yes Gollan, Kathlene November, MD  ketoconazole (NIZORAL) 2 % shampoo Apply topically. 04/20/20  Yes [provider]  lisinopril (ZESTRIL) 40 MG tablet TAKE 1 TABLET(40 MG) BY MOUTH DAILY 10/20/20  Yes Bedsole, Amy E, MD  magnesium 30 MG tablet Take 30 mg by mouth 2 (two) times daily.   Yes [provider]  metoprolol succinate (TOPROL-XL) 100 MG 24 hr tablet TAKE 1 TABLET BY MOUTH ONCE DAILY EITHER WITH OR IMMEDIATELY FOLLOWING A MEAL 08/24/20  Yes Bedsole, Amy E, MD  omeprazole (PRILOSEC) 10 MG capsule TAKE 1 CAPSULE(10 MG) BY MOUTH DAILY 07/21/20  Yes Bedsole, Amy E, MD  ondansetron (ZOFRAN) 4 MG tablet Take 1 tablet (4 mg total) by mouth every 6 (six) hours. 11/27/20  Yes Verda Cumins, MD  Probiotic Product (PROBIOTIC DAILY PO) Take 1 capsule by mouth daily.    Yes [provider]  rosuvastatin (CRESTOR) 10 MG tablet Take 1 tablet (10 mg total) by mouth daily. 10/22/19  Yes Minna Merritts, MD  venlafaxine XR (EFFEXOR-XR) 150 MG 24 hr capsule TAKE 1 CAPSULE(150 MG) BY MOUTH DAILY 07/05/20  Yes Bedsole, Amy E, MD    Family History Family History  Problem Relation Age of Onset   Diabetes Mother    Heart failure Mother    Hypertension Mother    Stroke Father    Hypertension Father    Heart disease Brother    Stroke Maternal Grandmother    Breast cancer Other 35       neice   Colon cancer Neg Hx    Colon polyps Neg Hx    Esophageal cancer Neg Hx    Stomach cancer  Neg Hx    Rectal cancer Neg Hx     Social History Social History   Tobacco Use   Smoking status: Never   Smokeless tobacco: Never  Vaping Use   Vaping Use: Never used  Substance Use Topics   Alcohol use: Yes    Alcohol/week: 14.0 standard drinks    Types: 6  Glasses of wine, 8 Standard drinks or equivalent per week    Comment: 2 glasses of wine per night   Drug use: No     Allergies   Patient has no known allergies.   Review of Systems Review of Systems  Constitutional:  Positive for activity change, appetite change, chills, fatigue and fever. Negative for diaphoresis.  HENT:  Negative for congestion, ear pain, postnasal drip, rhinorrhea, sinus pressure, sinus pain, sneezing and sore throat.   Eyes:  Negative for pain.  Respiratory:  Negative for cough, chest tightness and shortness of breath.   Cardiovascular:  Negative for chest pain and palpitations.  Gastrointestinal:  Positive for nausea. Negative for abdominal pain, diarrhea and vomiting.  Genitourinary:  Negative for dysuria.  Musculoskeletal:  Positive for myalgias. Negative for back pain, neck pain and neck stiffness.  Skin:  Positive for color change, rash and wound. Negative for pallor.  Allergic/Immunologic: Positive for environmental allergies.  Neurological:  Positive for headaches. Negative for dizziness, light-headedness and numbness.  All other systems reviewed and are negative.   Physical Exam Triage Vital Signs ED Triage Vitals  Enc Vitals Group     BP 11/27/20 1440 (!) 154/80     Pulse Rate 11/27/20 1440 90     Resp 11/27/20 1440 16     Temp 11/27/20 1440 100.2 F (37.9 C)     Temp Source 11/27/20 1440 Oral     SpO2 11/27/20 1440 99 %     Weight 11/27/20 1442 190 lb (86.2 kg)     Height 11/27/20 1442 5' 4"  (1.626 m)     Head Circumference --      Peak Flow --      Pain Score 11/27/20 1440 1     Pain Loc --      Pain Edu? --      Excl. in [place]? --    No data found.  Updated Vital  Signs BP (!) 154/80 (BP Location: Left Arm)   Pulse 90   Temp 100.2 F (37.9 C) (Oral)   Resp 16   Ht 5' 4"  (1.626 m)   Wt 86.2 kg   SpO2 99%   BMI 32.61 kg/m   Visual Acuity Right Eye Distance:   Left Eye Distance:   Bilateral Distance:    Right Eye Near:   Left Eye Near:    Bilateral Near:     Physical Exam Constitutional:      General: She is not in acute distress.    Appearance: Normal appearance. She is not ill-appearing, toxic-appearing or diaphoretic.  HENT:     Head: Normocephalic and atraumatic.     Right Ear: Tympanic membrane normal.     Left Ear: Tympanic membrane normal.     Nose: Nose normal. No congestion or rhinorrhea.     Mouth/Throat:     Mouth: Mucous membranes are moist.     Pharynx: No oropharyngeal exudate or posterior oropharyngeal erythema.  Eyes:     General: No scleral icterus.       Right eye: No discharge.        Left eye: No discharge.     Extraocular Movements: Extraocular movements intact.     Conjunctiva/sclera: Conjunctivae normal.     Pupils: Pupils are equal, round, and reactive to light.  Cardiovascular:     Rate and Rhythm: Normal rate and regular rhythm.     Pulses: Normal pulses.     Heart sounds: Normal heart sounds. No murmur heard.  No friction rub. No gallop.  Pulmonary:     Effort: Pulmonary effort is normal.     Breath sounds: Normal breath sounds. No stridor. No wheezing, rhonchi or rales.  Abdominal:     General: Abdomen is flat.     Tenderness: There is no abdominal tenderness. There is no right CVA tenderness, left CVA tenderness, guarding or rebound.  Musculoskeletal:     Cervical back: Normal range of motion and neck supple. No rigidity or tenderness.  Lymphadenopathy:     Cervical: No cervical adenopathy.  Skin:    General: Skin is warm and dry.     Capillary Refill: Capillary refill takes less than 2 seconds.     Coloration: Skin is not jaundiced.     Findings: Erythema, lesion and rash present. No  bruising.  Neurological:     General: No focal deficit present.     Mental Status: She is alert and oriented to person, place, and time.     GCS: GCS eye subscore is 4. GCS verbal subscore is 5. GCS motor subscore is 6.     Cranial Nerves: Cranial nerves are intact.     Sensory: Sensation is intact.     Motor: Motor function is intact.     Coordination: Coordination is intact.     Gait: Gait is intact.     Deep Tendon Reflexes: Reflexes are normal and symmetric.     Reflex Scores:      Tricep reflexes are 2+ on the right side and 2+ on the left side.      Bicep reflexes are 2+ on the right side and 2+ on the left side.      Brachioradialis reflexes are 2+ on the right side and 2+ on the left side.      Patellar reflexes are 2+ on the right side and 2+ on the left side.      Achilles reflexes are 2+ on the right side and 2+ on the left side.    UC Treatments / Results  Labs (all labs ordered are listed, but only abnormal results are displayed) Labs Reviewed  SARS CORONAVIRUS 2 (TAT 6-24 HRS)  INFLUENZA A AND B ANTIGEN (CONVERTED LAB)  POC INFLUENZA A AND B ANTIGEN (URGENT CARE ONLY)    EKG   Radiology No results found.  Procedures Procedures (including critical care time)  Medications Ordered in UC Medications - No data to display  Initial Impression / Assessment and Plan / UC Course  I have reviewed the triage vital signs and the nursing notes.  Pertinent labs & imaging results that were available during my care of the patient were reviewed by me and considered in my medical decision making (see chart for details).  Clinical impression: 1.  Febrile illness for 4 days. 2.  Tick bite exposure 13 days ago 3.  Nausea without vomiting. 4.  Fever with chills. 5.  Decreased appetite 6.  Myalgias  Treatment plan: 1.  The findings and treatment plan were discussed in detail with the patient.  Patient was in agreement. 2.  Recommended we get a influenza test here in the  office.  It was negative. 3.  Recommended we get a COVID test and was sent out to the hospital.  Someone will be in touch with her with a positive result.  She can follow along on her phone with the app MyChart. 4.  Educational handouts provided. 5.  We will go ahead and treat her with doxycycline for presumed  tickborne illness.  I did indicate to her that it was the weekend and our lab was not 100% operational so I could not draw any titers.  That said I will empirically treat her with 10 days of doxycycline. 6.  Tylenol or Motrin and continue to control the fever. 7.  Sent in some Zofran for her nausea. 8.  If her symptoms do not respond to the current treatment then I need her to follow-up with her primary care provider. 9.  If her symptoms were to worsen that I do want her to go to the ER for higher level of care. 10.  She was discharged from care in stable condition and she will follow-up here as needed.    Final Clinical Impressions(s) / UC Diagnoses   Final diagnoses:  Tick bite of other part of head, initial encounter  Febrile illness  Nausea without vomiting  Headache due to viral infection  Chills  Myalgia  Decreased appetite     Discharge Instructions      As we discussed, given your fever and constellation of symptoms we will go ahead and treat you with doxycycline.  This is for presumed tickborne illness. We will go ahead and test you for influenza and COVID given your fever. Please see educational handouts. I will also send in a prescription for your nausea should you needed. Continue to treat your fever with Tylenol or ibuprofen. If your symptoms persist and do not respond to the treatment today please see someone in your primary care office this week. If your symptoms worsen in any way please go to the emergency room. As we discussed, the lab does not have the capabilities today, on a Saturday, to draw any tickborne titers so I am treating you with antibiotics  without any cultures or titers.     ED Prescriptions     Medication Sig Dispense Auth. Provider   doxycycline (VIBRAMYCIN) 100 MG capsule Take 1 capsule (100 mg total) by mouth 2 (two) times daily. 20 capsule Verda Cumins, MD   ondansetron (ZOFRAN) 4 MG tablet Take 1 tablet (4 mg total) by mouth every 6 (six) hours. 12 tablet Verda Cumins, MD      PDMP not reviewed this encounter.   Verda Cumins, MD 11/27/20 [phone]

## 2020-11-27 NOTE — Discharge Instructions (Addendum)
As we discussed, given your fever and constellation of symptoms we will go ahead and treat you with doxycycline.  This is for presumed tickborne illness. We will go ahead and test you for influenza and COVID given your fever. Please see educational handouts. I will also send in a prescription for your nausea should you needed. Continue to treat your fever with Tylenol or ibuprofen. If your symptoms persist and do not respond to the treatment today please see someone in your primary care office this week. If your symptoms worsen in any way please go to the emergency room. As we discussed, the lab does not have the capabilities today, on a Saturday, to draw any tickborne titers so I am treating you with antibiotics without any cultures or titers.

## 2020-11-27 NOTE — ED Triage Notes (Signed)
Patient c/o tick bite on chin 11/14/2020. Per patient also wasp bite on chin 11/19/2020. Per patient got sick on 11/24/20 with nausea and fever.

## 2020-11-28 LAB — SARS CORONAVIRUS 2 (TAT 6-24 HRS): SARS Coronavirus 2: NEGATIVE

## 2020-11-29 ENCOUNTER — Telehealth (HOSPITAL_COMMUNITY): Payer: Self-pay | Admitting: Emergency Medicine

## 2020-11-29 ENCOUNTER — Telehealth: Payer: Self-pay | Admitting: Family Medicine

## 2020-11-29 MED ORDER — AMOXICILLIN 500 MG PO CAPS: 500.0000 mg | ORAL_CAPSULE | Freq: Three times a day (TID) | ORAL | 0 refills | Status: AC

## 2020-11-29 NOTE — Telephone Encounter (Signed)
Mrs. Keilly called in due to she was given doxycycline to  amoxicillin due to the doxycycline is not agreeing with her

## 2020-11-29 NOTE — Telephone Encounter (Signed)
See MyChart message

## 2020-11-29 NOTE — Telephone Encounter (Signed)
PLEASE NOTE: All timestamps contained within this report are represented as [place] Standard Time. CONFIDENTIALTY NOTICE: This fax transmission is intended only for the addressee. It contains information that is legally privileged, confidential or otherwise protected from use or disclosure. If you are not the intended recipient, you are strictly prohibited from reviewing, disclosing, copying using or disseminating any of this information or taking any action in reliance on or regarding this information. If you have received this fax in error, please notify us immediately by telephone so that we can arrange for its return to [place]. Phone: [phone], Toll-Free: [phone], Fax: [phone] Page: 1 of 2 Call Id: [id number] [place] Night - Client TELEPHONE ADVICE RECORD AccessNurse Patient Name: Christy Freeman Gender: Female DOB: [birth year] Age: 70 Y 66 M 25 D Return Phone Number: [phone] (Primary) Address: [place] [address] Client [facility] Night - Client Client Site [place] Physician Christy Freeman - MD Contact Type Call Who Is Calling Patient / Member / Family / Caregiver Call Type Triage / Clinical Relationship To Patient Self Return Phone Number [phone] (Primary) Chief Complaint Tick Bite Reason for Call Symptomatic / Request for [name] states she has a tick bite on her chin, needs help. Went to the UC on Saturday. Has the tick bite since 06/12. She got sick on wednesday with a fever, head ache, night sweats, muscle aches and low appetite. The UC gaver her medication but wants to know if she should take amoxicillin since she now has a rash. Translation No Nurse Assessment Nurse: Christy Mares, RN, Christy Freeman Date/Time (Eastern Time): 11/29/2020 7:55:11 AM Confirm and document reason for call. If symptomatic, describe symptoms. ---Caller  states she has a tick bite on her chin, needs help. Went to the UC on Saturday. Has the tick bite since 06/12. She got sick on Wednesday 6/22 with a fever, head ache, night sweats, muscle aches and low appetite. The UC (6/25) gave her medication but wants to know if she should take Doxycycline since she now has a rash. She has not been able to take the Doxycycline due to vomiting. She is wondering if she should have another abx (Amoxicillin?). Reports that her s/s have improved excepts that she has a widespread rash and continues to have the night sweats. Does the patient have any new or worsening symptoms? ---Yes Will a triage be completed? ---Yes Related visit to physician within the last 2 weeks? ---Yes Does the PT have any chronic conditions? (i.e. diabetes, asthma, this includes High risk factors for pregnancy, etc.) ---Yes List chronic conditions. ---HTN; GERD; Is this a behavioral health or substance abuse call? ---No PLEASE NOTE: All timestamps contained within this report are represented as [place] Standard Time. CONFIDENTIALTY NOTICE: This fax transmission is intended only for the addressee. It contains information that is legally privileged, confidential or otherwise protected from use or disclosure. If you are not the intended recipient, you are strictly prohibited from reviewing, disclosing, copying using or disseminating any of this information or taking any action in reliance on or regarding this information. If you have received this fax in error, please notify us immediately by telephone so that we can arrange for its return to [place]. Phone: [phone], Toll-Free: [phone], Fax: [phone] Page: 2 of 2 Call Id: [id number] Guidelines Guideline Title Affirmed Question Affirmed Notes Nurse Date/Time Christy Freeman Time) Infection on Antibiotic Follow-up Call [1] Caller has NONURGENT question AND [2]  triager unable to answer question Christy Freeman 11/29/2020  8:01:26 AM Disp. Time Christy Freeman Time) Disposition Final User 11/29/2020 8:04:24 AM Call PCP within 24 Hours Yes Christy Mares, RN, Christy Freeman Disagree/Comply Comply Caller Understands Yes PreDisposition Call Doctor Care Advice Given Per Guideline CALL PCP WITHIN 24 HOURS: * You need to discuss this with your doctor (or NP/PA) within the next 24 hours. * IF OFFICE WILL BE OPEN: Call the office when it opens tomorrow morning. CALL BACK IF: * You have more questions or concerns * You become worse CARE ADVICE given per Infection on Antibiotics Follow-Up Call (Adult) guideline. Referrals REFERRED TO PCP OFFICE

## 2020-11-29 NOTE — Telephone Encounter (Signed)
Patient left a voicemail stating she was not handling Doxycycline prescribed well and was hoping for a change in medication.   Called patient to review and she states she has had some nausea during her course of illness, but projectile vomited last night approximately 30 minutes after taking her first dose of doxycycline.  States she then took two doses of prescribed Zofran.  Today she has a rash, is unsure if tick bite related or medication related.   Reviewed with Dr. Lacinda Axon who states to change patient to Amoxicillin, d/c other meds, continue to monitor.  Reviewed with patient, verified pharmacy, prescription sent.  Return precautions reviewed

## 2020-11-29 NOTE — Telephone Encounter (Signed)
I would normally have the patient continue doxycycline with tick-borne illness.  It sounds like she has true illness, and coverage for Lyme and RMSF is appropriate.   I would like to involve Dr. B for communication with patient.

## 2020-11-29 NOTE — Telephone Encounter (Signed)
Christy Freeman notified as instructed by telephone.  She states she had also reached out to the urgent care and they went ahead and sent her in the Amoxicillin.  She will call back for follow up if no improvement.

## 2020-12-01 NOTE — Telephone Encounter (Signed)
You have no available appointments until next week.  Please advise where you would like her put on your schedule tomorrow.

## 2020-12-01 NOTE — Telephone Encounter (Signed)
Christy Freeman called in and stated she was told that if she has any more symptoms then Dr. Diona Browner would get her in quicker. She stated that she is coughing and she is going hoarse and she is taking the medication, but those symptoms  are new.

## 2020-12-02 NOTE — Telephone Encounter (Signed)
Left message and sent MyChart message for Christy Freeman that Dr Diona Browner can see her on Friday at 3:40 pm.  I have added her to the schedule and ask her to call me back if this time did not work for her.

## 2020-12-03 ENCOUNTER — Other Ambulatory Visit: Payer: Self-pay

## 2020-12-03 ENCOUNTER — Encounter: Payer: Self-pay | Admitting: Family Medicine

## 2020-12-03 ENCOUNTER — Ambulatory Visit (INDEPENDENT_AMBULATORY_CARE_PROVIDER_SITE_OTHER): Payer: PPO | Admitting: Family Medicine

## 2020-12-03 VITALS — BP 130/64 | HR 72 | Temp 98.1°F | Ht 64.0 in | Wt 190.5 lb

## 2020-12-03 DIAGNOSIS — W57XXXD Bitten or stung by nonvenomous insect and other nonvenomous arthropods, subsequent encounter: Secondary | ICD-10-CM | POA: Diagnosis not present

## 2020-12-03 DIAGNOSIS — R490 Dysphonia: Secondary | ICD-10-CM | POA: Diagnosis not present

## 2020-12-03 NOTE — Progress Notes (Signed)
Patient ID: Christy Freeman, female    DOB: 08/11/1950, 69 y.o.   MRN: [id number]  This visit was conducted in person.  BP 130/64   Pulse 72   Temp 98.1 F (36.7 C) (Temporal)   Ht 5' 4"  (1.626 m)   Wt 190 lb 8 oz (86.4 kg)   SpO2 98%   BMI 32.70 kg/m    CC:  Chief Complaint  Patient presents with   Follow-up    Tick Bite   Cough    Negative Covid and Flu test   Hoarse     HPI: Christy Freeman is a 70 y.o. female presenting on 12/03/2020 for Follow-up (Tick Bite), Cough (Negative Covid and Flu test), and Hoarse  Tick bite was on 6/12.Marland Kitchen attached < 24 hours  She reports   on 11/24/20 .. started with nausea, headache. Fever 101.0-102 F  Had sore neck, body ache, night sweats.  Using tylenol  COVID test home 6/25 neg as well as negative at urgent care   She was seen at urgent care.. treated for presumed tick borne illness with doxy.Marland Kitchen stopped as caused emesis. Unable to do blood work at that visit.   Changed to amoxicillin on  11/29/2020 TID x 14 days  Rash started on arms and trunk.. small red dots.   Since then no further fever, feeling better, no night sweats, body ahce and neck stiffness, headache better.  Now only hoarse voice and hoarseness.. rash has decreased.        Relevant past medical, surgical, family and social history reviewed and updated as indicated. Interim medical history since our last visit reviewed. Allergies and medications reviewed and updated. Outpatient Medications Prior to Visit  Medication Sig Dispense Refill   amLODipine (NORVASC) 10 MG tablet TAKE 1 TABLET(10 MG) BY MOUTH DAILY 90 tablet 3   amoxicillin (AMOXIL) 500 MG capsule Take 1 capsule (500 mg total) by mouth 3 (three) times daily for 14 days. 42 capsule 0   aspirin EC 81 MG tablet Take 81 mg by mouth daily. Swallow whole.     cetirizine (ZYRTEC) 10 MG tablet Take 10 mg by mouth daily.     Cholecalciferol (VITAMIN D3 PO) Take by mouth.     clobetasol ointment (TEMOVATE) 0.05  %      ezetimibe (ZETIA) 10 MG tablet TAKE 1 TABLET BY MOUTH DAILY 90 tablet 1   ketoconazole (NIZORAL) 2 % shampoo Apply topically.     lisinopril (ZESTRIL) 40 MG tablet TAKE 1 TABLET(40 MG) BY MOUTH DAILY 90 tablet 1   magnesium 30 MG tablet Take 30 mg by mouth 2 (two) times daily.     metoprolol succinate (TOPROL-XL) 100 MG 24 hr tablet TAKE 1 TABLET BY MOUTH ONCE DAILY EITHER WITH OR IMMEDIATELY FOLLOWING A MEAL 90 tablet 1   omeprazole (PRILOSEC) 10 MG capsule TAKE 1 CAPSULE(10 MG) BY MOUTH DAILY 90 capsule 1   ondansetron (ZOFRAN) 4 MG tablet Take 1 tablet (4 mg total) by mouth every 6 (six) hours. 12 tablet 0   Probiotic Product (PROBIOTIC DAILY PO) Take 1 capsule by mouth daily.      rosuvastatin (CRESTOR) 10 MG tablet Take 1 tablet (10 mg total) by mouth daily. 90 tablet 3   venlafaxine XR (EFFEXOR-XR) 150 MG 24 hr capsule TAKE 1 CAPSULE(150 MG) BY MOUTH DAILY 90 capsule 1   Facility-Administered Medications Prior to Visit  Medication Dose Route Frequency Provider Last Rate Last Admin   0.9 %  sodium  chloride infusion  500 mL Intravenous Once Lucio Edward T, MD       0.9 %  sodium chloride infusion  500 mL Intravenous Once Ladene Artist, MD         Per HPI unless specifically indicated in ROS section below Review of Systems  Constitutional:  Negative for fatigue and fever.  HENT:  Negative for ear pain.   Eyes:  Negative for pain.  Respiratory:  Negative for chest tightness and shortness of breath.   Cardiovascular:  Negative for chest pain, palpitations and leg swelling.  Gastrointestinal:  Negative for abdominal pain.  Genitourinary:  Negative for dysuria.  Objective:  BP 130/64   Pulse 72   Temp 98.1 F (36.7 C) (Temporal)   Ht 5' 4"  (1.626 m)   Wt 190 lb 8 oz (86.4 kg)   SpO2 98%   BMI 32.70 kg/m   Wt Readings from Last 3 Encounters:  12/03/20 190 lb 8 oz (86.4 kg)  11/27/20 190 lb (86.2 kg)  10/26/20 196 lb 2 oz (89 kg)      Physical Exam Constitutional:       General: She is not in acute distress.    Appearance: Normal appearance. She is well-developed. She is not ill-appearing or toxic-appearing.  HENT:     Head: Normocephalic.     Right Ear: Hearing, tympanic membrane, ear canal and external ear normal. Tympanic membrane is not erythematous, retracted or bulging.     Left Ear: Hearing, tympanic membrane, ear canal and external ear normal. Tympanic membrane is not erythematous, retracted or bulging.     Nose: No mucosal edema or rhinorrhea.     Right Sinus: No maxillary sinus tenderness or frontal sinus tenderness.     Left Sinus: No maxillary sinus tenderness or frontal sinus tenderness.     Mouth/Throat:     Pharynx: Uvula midline.  Eyes:     General: Lids are normal. Lids are everted, no foreign bodies appreciated.     Conjunctiva/sclera: Conjunctivae normal.     Pupils: Pupils are equal, round, and reactive to light.  Neck:     Thyroid: No thyroid mass or thyromegaly.     Vascular: No carotid bruit.     Trachea: Trachea normal.  Cardiovascular:     Rate and Rhythm: Normal rate and regular rhythm.     Pulses: Normal pulses.     Heart sounds: Normal heart sounds, S1 normal and S2 normal. No murmur heard.   No friction rub. No gallop.  Pulmonary:     Effort: Pulmonary effort is normal. No tachypnea or respiratory distress.     Breath sounds: Normal breath sounds. No decreased breath sounds, wheezing, rhonchi or rales.  Abdominal:     General: Bowel sounds are normal.     Palpations: Abdomen is soft.     Tenderness: There is no abdominal tenderness.  Musculoskeletal:     Cervical back: Normal range of motion and neck supple.  Skin:    General: Skin is warm and dry.     Findings: No rash.  Neurological:     Mental Status: She is alert.  Psychiatric:        Mood and Affect: Mood is not anxious or depressed.        Speech: Speech normal.        Behavior: Behavior normal. Behavior is cooperative.        Thought Content:  Thought content normal.        Judgment: Judgment  normal.      Results for orders placed or performed during the hospital encounter of 11/27/20  SARS CORONAVIRUS 2 (TAT 6-24 HRS) Nasopharyngeal Nasopharyngeal Swab   Specimen: Nasopharyngeal Swab  Result Value Ref Range   SARS Coronavirus 2 NEGATIVE NEGATIVE  Influenza A and B antigen  Result Value Ref Range   INFLUENZA A ANTIGEN, POC NEGATIVE NEGATIVE   INFLUENZA B ANTIGEN, POC NEGATIVE NEGATIVE    This visit occurred during the SARS-CoV-2 public health emergency.  Safety protocols were in place, including screening questions prior to the visit, additional usage of staff PPE, and extensive cleaning of exam room while observing appropriate contact time as indicated for disinfecting solutions.   COVID 19 screen:  No recent travel or known exposure to COVID19 The patient denies respiratory symptoms of COVID 19 at this time. The importance of social distancing was discussed today.   Assessment and Plan    Problem List Items Addressed This Visit     Hoarseness    Acute, not improving. No clear viral URI.  Negative COVID.   Treat for GERD as cause.. use PPI.      Tick bite - Primary    Acute, improving. Continue  amoxicillin for presumed tick borne illness.        Eliezer Lofts, MD

## 2020-12-03 NOTE — Patient Instructions (Signed)
Continue  amoxicillin for presumed tick borne illness.  Can increase  omeprazole to 2 tabs daily for possible relux causing hoarse voice.

## 2020-12-07 ENCOUNTER — Ambulatory Visit: Payer: PPO | Admitting: Family Medicine

## 2020-12-10 ENCOUNTER — Other Ambulatory Visit: Payer: Self-pay | Admitting: Cardiovascular Disease

## 2020-12-21 ENCOUNTER — Other Ambulatory Visit: Payer: Self-pay

## 2020-12-21 ENCOUNTER — Ambulatory Visit
Admission: RE | Admit: 2020-12-21 | Discharge: 2020-12-21 | Disposition: A | Discharge status: Home / Self Care | Payer: PPO | Source: Ambulatory Visit | Attending: Primary Care | Admitting: Primary Care

## 2020-12-21 DIAGNOSIS — R922 Inconclusive mammogram: Secondary | ICD-10-CM | POA: Diagnosis not present

## 2020-12-21 DIAGNOSIS — N6321 Unspecified lump in the left breast, upper outer quadrant: Secondary | ICD-10-CM | POA: Insufficient documentation

## 2020-12-21 DIAGNOSIS — N6320 Unspecified lump in the left breast, unspecified quadrant: Secondary | ICD-10-CM

## 2020-12-26 ENCOUNTER — Other Ambulatory Visit: Payer: Self-pay | Admitting: Family Medicine

## 2020-12-27 ENCOUNTER — Other Ambulatory Visit: Payer: Self-pay | Admitting: Family Medicine

## 2021-01-05 ENCOUNTER — Other Ambulatory Visit: Payer: Self-pay | Admitting: Family Medicine

## 2021-01-05 DIAGNOSIS — Z1231 Encounter for screening mammogram for malignant neoplasm of breast: Secondary | ICD-10-CM

## 2021-01-25 ENCOUNTER — Other Ambulatory Visit: Payer: Self-pay

## 2021-01-25 ENCOUNTER — Ambulatory Visit (INDEPENDENT_AMBULATORY_CARE_PROVIDER_SITE_OTHER): Payer: PPO | Admitting: Vascular Surgery

## 2021-01-25 VITALS — BP 157/72 | HR 72 | Ht 64.0 in | Wt 182.0 lb

## 2021-01-25 DIAGNOSIS — I890 Lymphedema, not elsewhere classified: Secondary | ICD-10-CM | POA: Diagnosis not present

## 2021-01-25 DIAGNOSIS — I10 Essential (primary) hypertension: Secondary | ICD-10-CM | POA: Diagnosis not present

## 2021-01-25 DIAGNOSIS — E785 Hyperlipidemia, unspecified: Secondary | ICD-10-CM | POA: Diagnosis not present

## 2021-01-25 NOTE — Assessment & Plan Note (Signed)
The patient has stage II lymphedema with chronic swelling and skin changes to the lower legs bilaterally.  She has some stasis dermatitis changes and discoloration is more prominent in the evenings in the mornings.  Appropriate use of compression socks, elevation, and exercise have somewhat helped but her symptoms persist.  At this point, the addition of the lymphedema pump would be an excellent adjuvant therapy to try to improve her swelling, pain, and discoloration of the lower legs.  I discussed the pathophysiology and natural history of lymphedema.  The patient voices her understanding and desires to proceed with a lymph pump and continued conservative therapy.  We will continue to follow her on 6 to 45-monthintervals.

## 2021-01-25 NOTE — Patient Instructions (Signed)
Lymphedema  Lymphedema is swelling that is caused by the abnormal collection of lymph in the tissues under the skin. Lymph is excess fluid from the tissues in your body that is removed through the lymphatic system. This system is part of your body's defense system (immune system) and includes lymph nodes and lymph vessels. The lymph vessels collect and carry the excess fluid, fats, proteins, and waste from the tissues of the body to the bloodstream. This system also works to clean and remove bacteria andwaste products from the body. Lymphedema occurs when the lymphatic system is blocked. When the lymph vessels or lymph nodes are blocked or damaged, lymph does not drain properly. This causes an abnormal buildup of lymph, which leads to swelling in the affected area. This may include the trunk area, or an arm or leg. Lymphedema cannot becured by medicines, but various methods can be used to help reduce the swelling. What are the causes? The cause of this condition depends on the type of lymphedema that you have. Primary lymphedema is caused by the absence of lymph vessels or having abnormal lymph vessels at birth. Secondary lymphedema occurs when lymph vessels are blocked or damaged. Secondary lymphedema is more common. Common causes of lymph vessel blockage include: Skin infection, such as cellulitis. Infection by parasites (filariasis). Injury. Radiation therapy. Cancer. Formation of scar tissue. Surgery. What are the signs or symptoms? Symptoms of this condition include: Swelling of the arm or leg. A heavy or tight feeling in the arm or leg. Swelling of the feet, toes, or fingers. Shoes or rings may fit more tightly than before. Redness of the skin over the affected area. Limited movement of the affected limb. Sensitivity to touch or discomfort in the affected limb. How is this diagnosed? This condition may be diagnosed based on: Your symptoms and medical history. A physical  exam. Bioimpedance spectroscopy. In this test, painless electrical currents are used to measure fluid levels in your body. Imaging tests, such as: MRI. CT scan. Duplex ultrasound. This test uses sound waves to produce images of the vessels and the blood flow on a screen. Lymphoscintigraphy. In this test, a low dose of a radioactive substance is injected to trace the flow of lymph through your lymph vessels. Lymphangiography. In this test, a contrast dye is injected into the lymph vessel to help show blockages. How is this treated?  If an underlying condition is causing the lymphedema, that condition will betreated. For example, antibiotic medicines may be used to treat an infection. Treatment for this condition will depend on the cause of your lymphedema. Treatment may include: Complete decongestive therapy (CDT). This is done by a certified lymphedema therapist to reduce fluid congestion. This therapy includes: Skin care. Compression wrapping of the affected area. Manual lymph drainage. This is a special massage technique that promotes lymph drainage out of a limb. Specific exercises. Certain exercises can help fluid move out of the affected limb. Compression. Various methods may be used to apply pressure to the affected limb to reduce the swelling. They include: Wearing compression stockings or sleeves on the affected limb. Wrapping the affected limb with special bandages. Surgery. This is usually done for severe cases only. For example, surgery may be done if you have trouble moving the limb or if the swelling does not get better with other treatments. Follow these instructions at home: Self-care The affected area is more likely to become injured or infected. Take these steps to help prevent infection: Keep the affected area clean and  dry. Use approved creams or lotions to keep the skin moisturized. Protect your skin from cuts: Use gloves while cooking or gardening. Do not walk  barefoot. If you shave the affected area, use an Copy. Do not wear tight clothes, shoes, or jewelry. Eat a healthy diet that includes a lot of fruits and vegetables. Activity Do exercises as told by your health care provider. Do not sit with your legs crossed. When possible, keep the affected limb raised (elevated) above the level of your heart. Avoid carrying things with an arm that is affected by lymphedema. General instructions Wear compression stockings or sleeves as told by your health care provider. Note any changes in size of the affected limb. You may be instructed to take regular measurements and keep track of them. Take over-the-counter and prescription medicines only as told by your health care provider. If you were prescribed an antibiotic medicine, take or apply it as told by your health care provider. Do not stop using the antibiotic even if you start to feel better or if your condition improves. Do not use heating pads or ice packs on the affected area. Avoid having blood draws, IV insertions, or blood pressure checks on the affected limb. Keep all follow-up visits. This is important. Contact a health care provider if you: Continue to have swelling in your limb. Have fluid leaking from the skin of your swollen limb. Have a cut that does not heal. Have redness or pain in the affected area. Develop purplish spots, rash, blisters, or sores (lesions) on your affected limb. Get help right away if you: Have new swelling in your limb that starts suddenly. Have shortness of breath or chest pain. Have a fever or chills. These symptoms may represent a serious problem that is an emergency. Do not wait to see if the symptoms will go away. Get medical help right away. Call your local emergency services (911 in the [place] Do not drive yourself to the hospital. Summary Lymphedema is swelling that is caused by the abnormal collection of lymph in the tissues under the  skin. Lymph is fluid from the tissues in your body that is removed through the lymphatic system. This system collects and carries excess fluid, fats, proteins, and wastes from the tissues of the body to the bloodstream. Lymphedema causes swelling, pain, and redness in the affected area. This may include the trunk area, or an arm or leg. Treatment for this condition may depend on the cause of your lymphedema. Treatment may include treating the underlying cause, complete decongestive therapy (CDT), compression methods, or surgery. This information is not intended to replace advice given to you by your health care provider. Make sure you discuss any questions you have with your healthcare provider. Document Revised: 03/17/2020 Document Reviewed: 03/17/2020 Elsevier Patient Education  2022 [employer].

## 2021-01-25 NOTE — Progress Notes (Signed)
MRN : [id number]  Christy Freeman is a 70 y.o. (11-Dec-1950) female who presents with chief complaint of  Chief Complaint  Patient presents with   Follow-up    1 yr no studies  .  History of Present Illness: Patient returns today in follow up of her leg swelling and discoloration.  This is done slightly better but still persists even with elevation, compression, and exercise.  Both legs are affected.  Her legs get heavy and tired particularly in the evening.  She has previously had a venous duplex which showed no significant superficial reflux requiring treatment, with only reflux at the saphenofemoral junctions bilaterally in the common femoral vein on the left.  Current Outpatient Medications  Medication Sig Dispense Refill   amLODipine (NORVASC) 10 MG tablet TAKE 1 TABLET(10 MG) BY MOUTH DAILY 90 tablet 3   aspirin EC 81 MG tablet Take 81 mg by mouth daily. Swallow whole.     cetirizine (ZYRTEC) 10 MG tablet Take 10 mg by mouth daily.     Cholecalciferol (VITAMIN D3 PO) Take by mouth.     clobetasol ointment (TEMOVATE) 0.05 %      ezetimibe (ZETIA) 10 MG tablet TAKE 1 TABLET BY MOUTH DAILY 90 tablet 1   ketoconazole (NIZORAL) 2 % shampoo Apply topically.     lisinopril (ZESTRIL) 40 MG tablet TAKE 1 TABLET(40 MG) BY MOUTH DAILY 90 tablet 1   magnesium 30 MG tablet Take 30 mg by mouth 2 (two) times daily.     metoprolol succinate (TOPROL-XL) 100 MG 24 hr tablet TAKE 1 TABLET BY MOUTH ONCE DAILY EITHER WITH OR IMMEDIATELY FOLLOWING A MEAL 90 tablet 1   omeprazole (PRILOSEC) 10 MG capsule TAKE 1 CAPSULE(10 MG) BY MOUTH DAILY 90 capsule 1   ondansetron (ZOFRAN) 4 MG tablet Take 1 tablet (4 mg total) by mouth every 6 (six) hours. 12 tablet 0   Probiotic Product (PROBIOTIC DAILY PO) Take 1 capsule by mouth daily.      rosuvastatin (CRESTOR) 10 MG tablet TAKE 1 TABLET(10 MG) BY MOUTH DAILY 90 tablet 2   venlafaxine XR (EFFEXOR-XR) 150 MG 24 hr capsule TAKE 1 CAPSULE(150 MG) BY MOUTH DAILY  90 capsule 0   Current Facility-Administered Medications  Medication Dose Route Frequency Provider Last Rate Last Admin   0.9 %  sodium chloride infusion  500 mL Intravenous Once Ladene Artist, MD       0.9 %  sodium chloride infusion  500 mL Intravenous Once Ladene Artist, MD        Past Medical History:  Diagnosis Date   Allergy    seasonal allergies   Anxiety    hx- on meds   Arthritis    Right hand   Blood transfusion without reported diagnosis 1989   Cancer ([place])    basal cell on face   Cataract    Colon polyps    GERD (gastroesophageal reflux disease)    H/O: depression    Hypercholesterolemia    diet controlled   Hypertension    Lung nodule    left lung    Psoriasis     Past Surgical History:  Procedure Laterality Date   APPENDECTOMY  06/14/2017   COLONOSCOPY  2016   TA x 1   CYST REMOVAL HAND Left    DILATION AND CURETTAGE OF UTERUS     LAPAROSCOPIC APPENDECTOMY N/A 06/14/2017   Procedure: APPENDECTOMY LAPAROSCOPIC;  Surgeon: Clayburn Pert, MD;  Location: [facility] ORS;  Service:  General;  Laterality: N/A;   POLYPECTOMY  2016   TA     Social History   Tobacco Use   Smoking status: Never   Smokeless tobacco: Never  Vaping Use   Vaping Use: Never used  Substance Use Topics   Alcohol use: Yes    Alcohol/week: 14.0 standard drinks    Types: 6 Glasses of wine, 8 Standard drinks or equivalent per week    Comment: 2 glasses of wine per night   Drug use: No      Family History  Problem Relation Age of Onset   Diabetes Mother    Heart failure Mother    Hypertension Mother    Stroke Father    Hypertension Father    Heart disease Brother    Stroke Maternal Grandmother    Breast cancer Other 35       neice   Colon cancer Neg Hx    Colon polyps Neg Hx    Esophageal cancer Neg Hx    Stomach cancer Neg Hx    Rectal cancer Neg Hx      Allergies  Allergen Reactions   Doxycycline Nausea And Vomiting    REVIEW OF SYSTEMS (Negative unless  checked)   Constitutional: [] Weight loss  [] Fever  [] Chills Cardiac: [] Chest pain   [] Chest pressure   [] Palpitations   [] Shortness of breath when laying flat   [] Shortness of breath at rest   [] Shortness of breath with exertion. Vascular:  [] Pain in legs with walking   [] Pain in legs at rest   [] Pain in legs when laying flat   [] Claudication   [] Pain in feet when walking  [] Pain in feet at rest  [] Pain in feet when laying flat   [] History of DVT   [] Phlebitis   [] Swelling in legs   [] Varicose veins   [] Non-healing ulcers Pulmonary:   [] Uses home oxygen   [] Productive cough   [] Hemoptysis   [] Wheeze  [] COPD   [] Asthma Neurologic:  [] Dizziness  [] Blackouts   [] Seizures   [] History of stroke   [] History of TIA  [] Aphasia   [] Temporary blindness   [] Dysphagia   [] Weakness or numbness in arms   [] Weakness or numbness in legs Musculoskeletal:  [x] Arthritis   [] Joint swelling   [x] Joint pain   [] Low back pain Hematologic:  [] Easy bruising  [] Easy bleeding   [] Hypercoagulable state   [x] Anemic  [] Hepatitis Gastrointestinal:  [] Blood in stool   [] Vomiting blood  [x] Gastroesophageal reflux/heartburn   [] Abdominal pain Genitourinary:  [] Chronic kidney disease   [] Difficult urination  [] Frequent urination  [] Burning with urination   [] Hematuria Skin:  [] Rashes   [] Ulcers   [] Wounds Psychological:  [] History of anxiety   []  History of major depression.  Physical Examination  BP (!) 157/72   Pulse 72   Ht 5' 4"  (1.626 m)   Wt 182 lb (82.6 kg)   BMI 31.24 kg/m  Gen:  WD/WN, NAD Head: [place]/AT, No temporalis wasting. Ear/Nose/Throat: Hearing grossly intact, nares w/o erythema or drainage Eyes: Conjunctiva clear. Sclera non-icteric Neck: Supple.  Trachea midline Pulmonary:  Good air movement, no use of accessory muscles.  Cardiac: RRR, no JVD Vascular:  Vessel Right Left  Radial Palpable Palpable                          PT Palpable Palpable  DP Palpable Palpable   Musculoskeletal: M/S 5/5  throughout.  No deformity or atrophy.  Mild stasis dermatitis changes present  bilaterally.  Mild bilateral lower extremity edema in the 1+ range. Neurologic: Sensation grossly intact in extremities.  Symmetrical.  Speech is fluent.  Psychiatric: Judgment intact, Mood & affect appropriate for pt's clinical situation. Dermatologic: No rashes or ulcers noted.  No cellulitis or open wounds.      Labs Recent Results (from the past 2160 hour(s))  SARS CORONAVIRUS 2 (TAT 6-24 HRS) Nasopharyngeal Nasopharyngeal Swab     Status: None   Collection Time: 11/27/20  3:38 PM   Specimen: Nasopharyngeal Swab  Result Value Ref Range   SARS Coronavirus 2 NEGATIVE NEGATIVE    Comment: (NOTE) SARS-CoV-2 target nucleic acids are NOT DETECTED.  The SARS-CoV-2 RNA is generally detectable in upper and lower respiratory specimens during the acute phase of infection. Negative results do not preclude SARS-CoV-2 infection, do not rule out co-infections with other pathogens, and should not be used as the sole basis for treatment or other patient management decisions. Negative results must be combined with clinical observations, patient history, and epidemiological information. The expected result is Negative.  Fact Sheet for Patients: SugarRoll.be  Fact Sheet for Healthcare Providers: https://www.woods-mathews.com/  This test is not yet approved or cleared by the [place] FDA and  has been authorized for detection and/or diagnosis of SARS-CoV-2 by FDA under an Emergency Use Authorization (EUA). This EUA will remain  in effect (meaning this test can be used) for the duration of the COVID-19 declaration under Se ction 564(b)(1) of the Act, 21 U.S.C. section 360bbb-3(b)(1), unless the authorization is terminated or revoked sooner.  Performed at [facility], [place] [address] [place],  [address]   Influenza A and B antigen     Status: None    Collection Time: 11/27/20  3:55 PM  Result Value Ref Range   INFLUENZA A ANTIGEN, POC NEGATIVE NEGATIVE   INFLUENZA B ANTIGEN, POC NEGATIVE NEGATIVE    Radiology No results found.  Assessment/Plan Hypertension blood pressure control important in reducing the progression of atherosclerotic disease. On appropriate oral medications.     Hyperlipidemia lipid control important in reducing the progression of atherosclerotic disease.  Continue statin therapy.  Lymphedema The patient has stage II lymphedema with chronic swelling and skin changes to the lower legs bilaterally.  She has some stasis dermatitis changes and discoloration is more prominent in the evenings in the mornings.  Appropriate use of compression socks, elevation, and exercise have somewhat helped but her symptoms persist.  At this point, the addition of the lymphedema pump would be an excellent adjuvant therapy to try to improve her swelling, pain, and discoloration of the lower legs.  I discussed the pathophysiology and natural history of lymphedema.  The patient voices her understanding and desires to proceed with a lymph pump and continued conservative therapy.  We will continue to follow her on 6 to 8-monthintervals.    JLeotis Pain MD  01/25/2021 9:16 AM    This note was created with Dragon medical transcription system.  Any errors from dictation are purely unintentional

## 2021-01-31 DIAGNOSIS — R490 Dysphonia: Secondary | ICD-10-CM | POA: Insufficient documentation

## 2021-01-31 DIAGNOSIS — W57XXXA Bitten or stung by nonvenomous insect and other nonvenomous arthropods, initial encounter: Secondary | ICD-10-CM | POA: Insufficient documentation

## 2021-01-31 NOTE — Assessment & Plan Note (Signed)
Acute, improving. Continue  amoxicillin for presumed tick borne illness.

## 2021-01-31 NOTE — Assessment & Plan Note (Addendum)
Acute, not improving. No clear viral URI.  Negative COVID.   Treat for GERD as cause.. use PPI.

## 2021-02-16 ENCOUNTER — Other Ambulatory Visit: Payer: Self-pay | Admitting: Family Medicine

## 2021-02-16 DIAGNOSIS — L400 Psoriasis vulgaris: Secondary | ICD-10-CM | POA: Diagnosis not present

## 2021-02-16 DIAGNOSIS — K2100 Gastro-esophageal reflux disease with esophagitis, without bleeding: Secondary | ICD-10-CM

## 2021-02-22 ENCOUNTER — Other Ambulatory Visit: Payer: Self-pay | Admitting: Family Medicine

## 2021-02-22 DIAGNOSIS — I10 Essential (primary) hypertension: Secondary | ICD-10-CM

## 2021-03-11 ENCOUNTER — Ambulatory Visit: Payer: PPO | Admitting: Podiatry

## 2021-03-17 DIAGNOSIS — L821 Other seborrheic keratosis: Secondary | ICD-10-CM | POA: Diagnosis not present

## 2021-03-17 DIAGNOSIS — Z79899 Other long term (current) drug therapy: Secondary | ICD-10-CM | POA: Diagnosis not present

## 2021-03-17 DIAGNOSIS — Z7689 Persons encountering health services in other specified circumstances: Secondary | ICD-10-CM | POA: Diagnosis not present

## 2021-03-17 DIAGNOSIS — L400 Psoriasis vulgaris: Secondary | ICD-10-CM | POA: Diagnosis not present

## 2021-03-22 ENCOUNTER — Other Ambulatory Visit: Payer: Self-pay

## 2021-03-22 ENCOUNTER — Ambulatory Visit: Payer: PPO | Admitting: Podiatry

## 2021-03-22 ENCOUNTER — Telehealth: Payer: Self-pay | Admitting: Family Medicine

## 2021-03-22 DIAGNOSIS — L989 Disorder of the skin and subcutaneous tissue, unspecified: Secondary | ICD-10-CM | POA: Diagnosis not present

## 2021-03-22 DIAGNOSIS — E559 Vitamin D deficiency, unspecified: Secondary | ICD-10-CM

## 2021-03-22 DIAGNOSIS — E785 Hyperlipidemia, unspecified: Secondary | ICD-10-CM

## 2021-03-22 NOTE — Progress Notes (Signed)
   Subjective: 70 y.o. female presenting to the office today as a new patient for evaluation of symptomatic calluses to the bilateral feet.  Patient states that she noticed some symptomatic calluses and she trimmed them down herself.  She presents for further treatment and evaluation.  They have been present for the past few months.  Currently she states that after trimming the calluses she has felt significant relief   Past Medical History:  Diagnosis Date   Allergy    seasonal allergies   Anxiety    hx- on meds   Arthritis    Right hand   Blood transfusion without reported diagnosis 1989   Cancer ([place])    basal cell on face   Cataract    Colon polyps    GERD (gastroesophageal reflux disease)    H/O: depression    Hypercholesterolemia    diet controlled   Hypertension    Lung nodule    left lung    Psoriasis      Objective:  Physical Exam General: Alert and oriented x3 in no acute distress  Dermatology: Hyperkeratotic lesion(s) present on the bilateral feet. Pain on palpation with a central nucleated core noted. Skin is warm, dry and supple bilateral lower extremities. Negative for open lesions or macerations.  Vascular: Palpable pedal pulses bilaterally. No edema or erythema noted. Capillary refill within normal limits.  Neurological: Epicritic and protective threshold grossly intact bilaterally.   Musculoskeletal Exam: Pain on palpation at the keratotic lesion(s) noted. Range of motion within normal limits bilateral. Muscle strength 5/5 in all groups bilateral.  Assessment: 1.  Symptomatic corns and calluses bilateral feet   Plan of Care:  1. Patient evaluated 2. Excisional debridement of keratoic lesion(s) using a chisel blade was performed without incident.  3. Dressed area with light dressing. 4. Patient is to return to the clinic PRN.   Edrick Kins, DPM [facility]  Dr. Edrick Kins, DPM    [address], [place] [address]                Office [phone]  Fax [phone]

## 2021-03-22 NOTE — Telephone Encounter (Signed)
-----   Message from Ellamae Sia sent at 03/21/2021 12:02 PM EDT ----- Regarding: Lab orders for Tuesday, 10.25.22  AWV lab orders, please.

## 2021-03-23 ENCOUNTER — Other Ambulatory Visit: Payer: Self-pay | Admitting: Family Medicine

## 2021-03-23 DIAGNOSIS — I10 Essential (primary) hypertension: Secondary | ICD-10-CM

## 2021-03-23 DIAGNOSIS — K2100 Gastro-esophageal reflux disease with esophagitis, without bleeding: Secondary | ICD-10-CM

## 2021-03-25 ENCOUNTER — Telehealth: Payer: Self-pay

## 2021-03-25 NOTE — Telephone Encounter (Signed)
Christy Freeman notified as instructed by telephone.  She is agreeable to having labs drawn same day as her CPE on 04/05/2021.  She will bring paperwork with her, about additional lab orders, to that appointment as well.  Lab appointment cancelled.

## 2021-03-25 NOTE — Telephone Encounter (Signed)
[place] Night - Client Nonclinical Telephone Record  [facility] Client [place] Night - Client Client Site [place] - Night Physician Eliezer Lofts - MD Contact Type Call Who Is Calling Patient / Member / Family / Caregiver Caller Name [name], [name] Phone Number [phone] Patient Name Christy Freeman, Christy Freeman Patient DOB [birth year] Call Type Message Only Information Provided Reason for Call Request for General Office Information Initial Comment Caller states she wants Dr. Rometta Emery nurse to call about her blood draw. Disp. Time Disposition Final User 03/25/2021 8:01:18 AM General Information Provided Yes Mable Paris Call Closed By: Mable Paris Transaction Date/Time: 03/25/2021 8:00:10 AM (ET

## 2021-03-25 NOTE — Telephone Encounter (Signed)
I left v/m requesting pt to cb about questions for lab appt on 03/29/21 or blood draw. Sending note to [facility] and triage.

## 2021-03-25 NOTE — Telephone Encounter (Signed)
It may just be easiest for [place] to do lab draw when she comes in so she can lie down for draw. I did not get a fax... she could bring in the item and we can discuss what labs to order at that time.   Please call the patient and see if she is agreeable with this.   Otherwise.. need her to re-fax and please ask lab in [place] for her to lie down prior.

## 2021-03-25 NOTE — Telephone Encounter (Signed)
Pt called back; pt already has lab appt scheduled for awv on 03/29/21 at 7:45; pt plans to go to [facility] for blood draw. Pt has 2 questions; pt faxed info on 03/22/21 to Dr Diona Browner to see about additional lab testing for moderate to severe psoriasis.pt wants to verify that Dr Diona Browner got the fax. Also pt said due to her hx with syncope at blood draws pt is to call ahead because pt will need to lay down prior to blood draw and pt said she is a hard stick. Pt wants to make sure it is OK to come to [facility] for blood draw or should pt go to central scheduling at [facility] lab? Pt request cb. Pt has appt also on 04/05/21 for awv with Dr Diona Browner. Sending note to Dr Diona Browner and Butch Penny CMA.

## 2021-03-28 ENCOUNTER — Other Ambulatory Visit: Payer: Self-pay | Admitting: Family Medicine

## 2021-03-29 ENCOUNTER — Other Ambulatory Visit: Payer: PPO

## 2021-03-31 DIAGNOSIS — L400 Psoriasis vulgaris: Secondary | ICD-10-CM | POA: Diagnosis not present

## 2021-04-05 ENCOUNTER — Ambulatory Visit (INDEPENDENT_AMBULATORY_CARE_PROVIDER_SITE_OTHER): Payer: PPO | Admitting: Family Medicine

## 2021-04-05 ENCOUNTER — Other Ambulatory Visit: Payer: Self-pay

## 2021-04-05 VITALS — BP 133/72 | HR 119 | Temp 98.3°F | Resp 14 | Ht 64.0 in | Wt 177.0 lb

## 2021-04-05 DIAGNOSIS — E785 Hyperlipidemia, unspecified: Secondary | ICD-10-CM | POA: Diagnosis not present

## 2021-04-05 DIAGNOSIS — E559 Vitamin D deficiency, unspecified: Secondary | ICD-10-CM

## 2021-04-05 DIAGNOSIS — Z8349 Family history of other endocrine, nutritional and metabolic diseases: Secondary | ICD-10-CM

## 2021-04-05 DIAGNOSIS — I700 Atherosclerosis of aorta: Secondary | ICD-10-CM

## 2021-04-05 DIAGNOSIS — I890 Lymphedema, not elsewhere classified: Secondary | ICD-10-CM | POA: Diagnosis not present

## 2021-04-05 DIAGNOSIS — Z23 Encounter for immunization: Secondary | ICD-10-CM

## 2021-04-05 DIAGNOSIS — I10 Essential (primary) hypertension: Secondary | ICD-10-CM | POA: Diagnosis not present

## 2021-04-05 DIAGNOSIS — L409 Psoriasis, unspecified: Secondary | ICD-10-CM | POA: Diagnosis not present

## 2021-04-05 DIAGNOSIS — Z0000 Encounter for general adult medical examination without abnormal findings: Secondary | ICD-10-CM | POA: Diagnosis not present

## 2021-04-05 DIAGNOSIS — F321 Major depressive disorder, single episode, moderate: Secondary | ICD-10-CM | POA: Diagnosis not present

## 2021-04-05 LAB — COMPREHENSIVE METABOLIC PANEL
ALT: 37 U/L — ABNORMAL HIGH (ref 0–35)
AST: 29 U/L (ref 0–37)
Albumin: 4.9 g/dL (ref 3.5–5.2)
Alkaline Phosphatase: 151 U/L — ABNORMAL HIGH (ref 39–117)
BUN: 11 mg/dL (ref 6–23)
CO2: 32 mEq/L (ref 19–32)
Calcium: 10.1 mg/dL (ref 8.4–10.5)
Chloride: 96 mEq/L (ref 96–112)
Creatinine, Ser: 0.51 mg/dL (ref 0.40–1.20)
GFR: 94.36 mL/min (ref >60.00)
Glucose, Bld: 101 mg/dL — ABNORMAL HIGH (ref 70–99)
Potassium: 4.5 mEq/L (ref 3.5–5.1)
Sodium: 137 mEq/L (ref 135–145)
Total Bilirubin: 0.7 mg/dL (ref 0.2–1.2)
Total Protein: 8.2 g/dL (ref 6.0–8.3)

## 2021-04-05 LAB — T4, FREE: Free T4: 0.67 ng/dL (ref 0.60–1.60)

## 2021-04-05 LAB — LIPID PANEL
Cholesterol: 180 mg/dL (ref 0–200)
HDL: 82.1 mg/dL (ref >39.00)
LDL Cholesterol: 79 mg/dL (ref 0–99)
NonHDL: 97.86
Total CHOL/HDL Ratio: 2
Triglycerides: 94 mg/dL (ref 0.0–149.0)
VLDL: 18.8 mg/dL (ref 0.0–40.0)

## 2021-04-05 LAB — CBC WITH DIFFERENTIAL/PLATELET
Basophils Absolute: 0.1 10*3/uL (ref 0.0–0.1)
Basophils Relative: 0.8 % (ref 0.0–3.0)
Eosinophils Absolute: 0.2 10*3/uL (ref 0.0–0.7)
Eosinophils Relative: 2 % (ref 0.0–5.0)
HCT: 42.7 % (ref 36.0–46.0)
Hemoglobin: 14.1 g/dL (ref 12.0–15.0)
Lymphocytes Relative: 23.9 % (ref 12.0–46.0)
Lymphs Abs: 2.1 10*3/uL (ref 0.7–4.0)
MCHC: 33.1 g/dL (ref 30.0–36.0)
MCV: 91.3 fl (ref 78.0–100.0)
Monocytes Absolute: 0.7 10*3/uL (ref 0.1–1.0)
Monocytes Relative: 7.7 % (ref 3.0–12.0)
Neutro Abs: 5.7 10*3/uL (ref 1.4–7.7)
Neutrophils Relative %: 65.6 % (ref 43.0–77.0)
Platelets: 269 10*3/uL (ref 150.0–400.0)
RBC: 4.67 Mil/uL (ref 3.87–5.11)
RDW: 13.8 % (ref 11.5–15.5)
WBC: 8.6 10*3/uL (ref 4.0–10.5)

## 2021-04-05 LAB — TSH: TSH: 2.65 u[IU]/mL (ref 0.35–5.50)

## 2021-04-05 LAB — T3, FREE: T3, Free: 3.4 pg/mL (ref 2.3–4.2)

## 2021-04-05 LAB — VITAMIN D 25 HYDROXY (VIT D DEFICIENCY, FRACTURES): VITD: 45.56 ng/mL (ref 30.00–100.00)

## 2021-04-05 NOTE — Patient Instructions (Signed)
Please stop at the lab to have labs drawn.

## 2021-04-05 NOTE — Progress Notes (Signed)
Patient ID: Christy Freeman, female    DOB: [birth year], 70 y.o.   MRN: [id number]  This visit was conducted in person.  BP (!) 168/76   Pulse (!) 119   Temp 98.3 F (36.8 C)   Resp 14   Ht 5' 4"  (1.626 m)   Wt 177 lb (80.3 kg)   SpO2 98%   BMI 30.38 kg/m    CC: Chief Complaint  Patient presents with   Medicare Wellness    Subjective:   HPI: Christy Freeman is a 70 y.o. female presenting on 04/05/2021 for Medicare Wellness  The patient presents for annual medicare wellness, complete physical and review of chronic health problems. He/She also has the following acute concerns today: new diagnsosi of psorias and need for additional testing.  Moderate severe plaque psoriasis: New diagnsois followed byDermatologist. Dr. Phillip Heal. Last note reviewed.  Needs further testing given considering biologic to treat such as Taltz vs Tremfya.  She is also requesting eval of thyroid given increase risk/association with psoriasis and other autoiummune diseases.  I have personally reviewed the Medicare Annual Wellness questionnaire and have noted 1. The patient's medical and social history 2. Their use of alcohol, tobacco or illicit drugs 3. Their current medications and supplements 4. The patient's functional ability including ADL's, fall risks, home safety risks and hearing or visual             impairment. 5. Diet and physical activities 6. Evidence for depression or mood disorders 7.         Updated provider list Cognitive evaluation was performed and recorded on pt medicare questionnaire form. The patients weight, height, BMI and visual acuity have been recorded in the chart   I have made referrals, counseling and provided education to the patient based review of the above and I have provided the pt with a written personalized care plan for preventive services.   Documentation of this information was scanned into the electronic record under the media tab.   Advance directives  and end of life planning reviewed in detail with patient and documented in EMR. Patient given handout on advance care directives if needed. HCPOA and living will updated if needed.   PHQ: 1   Fall Risk 08/15/2017 09/25/2017 09/05/2018 10/09/2018 04/02/2020  Falls in the past year? No No 0 1 0  Was there an injury with Fall? - - - 1 -  Fall Risk Category Calculator - - - 2 -  Fall Risk Category - - - Moderate -  Patient Fall Risk Level - - Low fall risk - -  Fall risk Follow up - - Falls evaluation completed - -   Hearing Screening - Comments:: Patient has hearing aid in her right ear, sees [facility] annually for this and they check both ears. Vision Screening - Comments:: Had eye exam with [facility] in 2022-not sure of the month.   Aortic atherosclerosis  followed by Dr. Rockey Situ  On crestor/zetia.  Due for re-eval lipids.   Fatty liver/NASH: re-eval with liver function tests.   GERD with esophagitis: stable on prilosec   MDD: stable control on venlafaxine.  Wt Readings from Last 3 Encounters:  04/05/21 177 lb (80.3 kg)  01/25/21 182 lb (82.6 kg)  12/03/20 190 lb 8 oz (86.4 kg)   She has been doing keto, Exercising daily and resistance bands.    Hypertension:    At home 133/70, HR in nml range  Using medication without problems or  lightheadedness:  none Chest pain with exertion:none Edema:none Short of breath: none  Average home BPs: Other issues:   Relevant past medical, surgical, family and social history reviewed and updated as indicated. Interim medical history since our last visit reviewed. Allergies and medications reviewed and updated. Outpatient Medications Prior to Visit  Medication Sig Dispense Refill   amLODipine (NORVASC) 10 MG tablet TAKE 1 TABLET(10 MG) BY MOUTH DAILY 90 tablet 0   aspirin EC 81 MG tablet Take 81 mg by mouth daily. Swallow whole.     cetirizine (ZYRTEC) 10 MG tablet Take 10 mg by mouth daily.     Cholecalciferol (VITAMIN D3 PO) Take  by mouth. 1000 units 2 times daily     clobetasol (TEMOVATE) 0.05 % external solution Apply topically 2 (two) times daily.     clobetasol ointment (TEMOVATE) 0.05 %      ezetimibe (ZETIA) 10 MG tablet TAKE 1 TABLET BY MOUTH DAILY 90 tablet 1   ketoconazole (NIZORAL) 2 % shampoo Apply topically.     lisinopril (ZESTRIL) 40 MG tablet TAKE 1 TABLET(40 MG) BY MOUTH DAILY 90 tablet 1   MAGNESIUM PO Take 425 mg by mouth 2 (two) times daily.     metoprolol succinate (TOPROL-XL) 100 MG 24 hr tablet TAKE 1 TABLET BY MOUTH EVERY DAY EITHER WITH OR IMMEDIATELY FOLLOWING A MEAL 90 tablet 0   omeprazole (PRILOSEC) 10 MG capsule TAKE 1 CAPSULE(10 MG) BY MOUTH DAILY 90 capsule 0   ondansetron (ZOFRAN) 4 MG tablet Take 1 tablet (4 mg total) by mouth every 6 (six) hours. 12 tablet 0   Probiotic Product (PROBIOTIC DAILY PO) Take 1 capsule by mouth in the morning, at noon, and at bedtime.     rosuvastatin (CRESTOR) 10 MG tablet TAKE 1 TABLET(10 MG) BY MOUTH DAILY 90 tablet 2   venlafaxine XR (EFFEXOR-XR) 150 MG 24 hr capsule TAKE 1 CAPSULE(150 MG) BY MOUTH DAILY 90 capsule 0   Facility-Administered Medications Prior to Visit  Medication Dose Route Frequency Provider Last Rate Last Admin   0.9 %  sodium chloride infusion  500 mL Intravenous Once Lucio Edward T, MD       0.9 %  sodium chloride infusion  500 mL Intravenous Once Ladene Artist, MD         Per HPI unless specifically indicated in ROS section below Review of Systems Objective:  BP (!) 168/76   Pulse (!) 119   Temp 98.3 F (36.8 C)   Resp 14   Ht 5' 4"  (1.626 m)   Wt 177 lb (80.3 kg)   SpO2 98%   BMI 30.38 kg/m   Wt Readings from Last 3 Encounters:  04/05/21 177 lb (80.3 kg)  01/25/21 182 lb (82.6 kg)  12/03/20 190 lb 8 oz (86.4 kg)      Physical Exam Vitals and nursing note reviewed.  Constitutional:      General: She is not in acute distress.    Appearance: Normal appearance. She is well-developed. She is not ill-appearing or  toxic-appearing.  HENT:     Head: Normocephalic.     Right Ear: Hearing, tympanic membrane, ear canal and external ear normal.     Left Ear: Hearing, tympanic membrane, ear canal and external ear normal.     Nose: Nose normal.  Eyes:     General: Lids are normal. Lids are everted, no foreign bodies appreciated.     Conjunctiva/sclera: Conjunctivae normal.     Pupils: Pupils are equal, round,  and reactive to light.  Neck:     Thyroid: No thyroid mass or thyromegaly.     Vascular: No carotid bruit.     Trachea: Trachea normal.  Cardiovascular:     Rate and Rhythm: Normal rate and regular rhythm.     Heart sounds: Normal heart sounds, S1 normal and S2 normal. No murmur heard.   No gallop.  Pulmonary:     Effort: Pulmonary effort is normal. No respiratory distress.     Breath sounds: Normal breath sounds. No wheezing, rhonchi or rales.  Abdominal:     General: Bowel sounds are normal. There is no distension or abdominal bruit.     Palpations: Abdomen is soft. There is no fluid wave or mass.     Tenderness: There is no abdominal tenderness. There is no guarding or rebound.     Hernia: No hernia is present.  Musculoskeletal:     Cervical back: Normal range of motion and neck supple.  Lymphadenopathy:     Cervical: No cervical adenopathy.  Skin:    General: Skin is warm and dry.     Findings: No rash.     Comments: Psoriasis patches on right hip, scalp and in ears bilaterally  Neurological:     Mental Status: She is alert.     Cranial Nerves: No cranial nerve deficit.     Sensory: No sensory deficit.  Psychiatric:        Mood and Affect: Mood is not anxious or depressed.        Speech: Speech normal.        Behavior: Behavior normal. Behavior is cooperative.        Judgment: Judgment normal.      Results for orders placed or performed during the hospital encounter of 11/27/20  SARS CORONAVIRUS 2 (TAT 6-24 HRS) Nasopharyngeal Nasopharyngeal Swab   Specimen: Nasopharyngeal Swab   Result Value Ref Range   SARS Coronavirus 2 NEGATIVE NEGATIVE  Influenza A and B antigen  Result Value Ref Range   INFLUENZA A ANTIGEN, POC NEGATIVE NEGATIVE   INFLUENZA B ANTIGEN, POC NEGATIVE NEGATIVE    This visit occurred during the SARS-CoV-2 public health emergency.  Safety protocols were in place, including screening questions prior to the visit, additional usage of staff PPE, and extensive cleaning of exam room while observing appropriate contact time as indicated for disinfecting solutions.   COVID 19 screen:  No recent travel or known exposure to COVID19 The patient denies respiratory symptoms of COVID 19 at this time. The importance of social distancing was discussed today.   Assessment and Plan   The patient's preventative maintenance and recommended screening tests for an annual wellness exam were reviewed in full today. Brought up to date unless services declined.  Counselled on the importance of diet, exercise, and its role in overall health and mortality. The patient's FH and SH was reviewed, including their home life, tobacco status, and drug and alcohol status.   Vaccines:S/P 5 COVID vaccines, td,PNA and  Given flu HD today Pap/DVE: not indicated, no HPV and neg in 2-19, patient wishes to repeat in 2023 to be safe. Mammo: 04/06/2021 Bone Density:osteopenia 2019, repeat in 5 years Colon:01/22/2020, repeat in 3 years, Dr. Fuller Plan  Smoking Status:none ETOH/ drug use:1-2/n a day/none  Hep C: done  Problem List Items Addressed This Visit     Aortic atherosclerosis ([place])   Hyperlipidemia   Relevant Orders   Lipid panel   Comprehensive metabolic panel   Hypertension  Relevant Orders   CBC with Differential/Platelet   MDD (major depressive disorder), single episode, moderate (HCC)   Vitamin D deficiency   Relevant Orders   VITAMIN D 25 Hydroxy (Vit-D Deficiency, Fractures)   Other Visit Diagnoses     Medicare annual wellness visit, subsequent    -  Primary    Psoriasis       Family history of hypothyroidism       Relevant Orders   TSH   T4, free   T3, free   Thyroid Peroxidase Antibodies (TPO) (REFL)   Thyroid Stimulating Immunoglobulin   Need for influenza vaccination           Eliezer Lofts, MD

## 2021-04-07 LAB — THYROID PEROXIDASE ANTIBODIES (TPO) (REFL): Thyroperoxidase Ab SerPl-aCnc: 2 IU/mL (ref <9)

## 2021-04-07 LAB — THYROID STIMULATING IMMUNOGLOBULIN: TSI: <89 % baseline (ref <140)

## 2021-04-20 DIAGNOSIS — R21 Rash and other nonspecific skin eruption: Secondary | ICD-10-CM | POA: Diagnosis not present

## 2021-04-20 DIAGNOSIS — L400 Psoriasis vulgaris: Secondary | ICD-10-CM | POA: Diagnosis not present

## 2021-04-20 DIAGNOSIS — L308 Other specified dermatitis: Secondary | ICD-10-CM | POA: Diagnosis not present

## 2021-04-20 DIAGNOSIS — L738 Other specified follicular disorders: Secondary | ICD-10-CM | POA: Diagnosis not present

## 2021-04-26 ENCOUNTER — Other Ambulatory Visit: Payer: Self-pay

## 2021-04-26 ENCOUNTER — Ambulatory Visit
Admission: RE | Admit: 2021-04-26 | Discharge: 2021-04-26 | Disposition: A | Discharge status: Home / Self Care | Payer: PPO | Source: Ambulatory Visit | Attending: Family Medicine | Admitting: Family Medicine

## 2021-04-26 DIAGNOSIS — Z1231 Encounter for screening mammogram for malignant neoplasm of breast: Secondary | ICD-10-CM | POA: Diagnosis not present

## 2021-04-27 ENCOUNTER — Other Ambulatory Visit: Payer: Self-pay | Admitting: Family Medicine

## 2021-04-27 DIAGNOSIS — R928 Other abnormal and inconclusive findings on diagnostic imaging of breast: Secondary | ICD-10-CM

## 2021-04-27 DIAGNOSIS — N6489 Other specified disorders of breast: Secondary | ICD-10-CM

## 2021-05-01 ENCOUNTER — Other Ambulatory Visit: Payer: Self-pay | Admitting: Cardiovascular Disease

## 2021-05-05 DIAGNOSIS — L570 Actinic keratosis: Secondary | ICD-10-CM | POA: Diagnosis not present

## 2021-05-05 DIAGNOSIS — L2089 Other atopic dermatitis: Secondary | ICD-10-CM | POA: Diagnosis not present

## 2021-05-06 ENCOUNTER — Ambulatory Visit
Admission: RE | Admit: 2021-05-06 | Discharge: 2021-05-06 | Disposition: A | Discharge status: Home / Self Care | Payer: PPO | Source: Ambulatory Visit | Attending: Family Medicine | Admitting: Family Medicine

## 2021-05-06 ENCOUNTER — Other Ambulatory Visit: Payer: Self-pay

## 2021-05-06 DIAGNOSIS — R922 Inconclusive mammogram: Secondary | ICD-10-CM | POA: Diagnosis not present

## 2021-05-06 DIAGNOSIS — R928 Other abnormal and inconclusive findings on diagnostic imaging of breast: Secondary | ICD-10-CM

## 2021-05-06 DIAGNOSIS — N6489 Other specified disorders of breast: Secondary | ICD-10-CM

## 2021-05-18 ENCOUNTER — Ambulatory Visit: Payer: Self-pay

## 2021-05-18 DIAGNOSIS — I10 Essential (primary) hypertension: Secondary | ICD-10-CM | POA: Diagnosis not present

## 2021-05-18 DIAGNOSIS — Z20822 Contact with and (suspected) exposure to covid-19: Secondary | ICD-10-CM | POA: Diagnosis not present

## 2021-05-18 DIAGNOSIS — R509 Fever, unspecified: Secondary | ICD-10-CM | POA: Diagnosis not present

## 2021-05-18 DIAGNOSIS — J101 Influenza due to other identified influenza virus with other respiratory manifestations: Secondary | ICD-10-CM | POA: Diagnosis not present

## 2021-05-19 DIAGNOSIS — L859 Epidermal thickening, unspecified: Secondary | ICD-10-CM | POA: Diagnosis not present

## 2021-05-19 DIAGNOSIS — D485 Neoplasm of uncertain behavior of skin: Secondary | ICD-10-CM | POA: Diagnosis not present

## 2021-05-23 ENCOUNTER — Telehealth: Payer: Self-pay

## 2021-05-23 ENCOUNTER — Encounter: Payer: Self-pay | Admitting: Family Medicine

## 2021-05-23 ENCOUNTER — Telehealth (INDEPENDENT_AMBULATORY_CARE_PROVIDER_SITE_OTHER): Payer: PPO | Admitting: Family Medicine

## 2021-05-23 ENCOUNTER — Other Ambulatory Visit: Payer: Self-pay

## 2021-05-23 VITALS — Ht 64.0 in | Wt 167.0 lb

## 2021-05-23 DIAGNOSIS — J208 Acute bronchitis due to other specified organisms: Secondary | ICD-10-CM

## 2021-05-23 DIAGNOSIS — G9331 Postviral fatigue syndrome: Secondary | ICD-10-CM

## 2021-05-23 MED ORDER — AZITHROMYCIN 250 MG PO TABS: ORAL_TABLET | ORAL | 0 refills | Status: AC

## 2021-05-23 NOTE — Telephone Encounter (Signed)
[place] Night - Client TELEPHONE ADVICE RECORD [facility] Patient Name: Christy Freeman Gender: Female DOB: [birth year] Age: 70 Y 10 M 17 D Return Phone Number: [phone] (Primary) Address: [place] [address] Client [facility] Night - Client Client Site [place] Provider Eliezer Lofts - MD Contact Type Call Who Is Calling Patient / Member / Family / Caregiver Call Type Triage / Clinical Relationship To Patient Self Return Phone Number [phone] (Primary) Chief Complaint Cough Reason for Call Symptomatic / Request for [place] states she had the flu and finished her medication and now she has a cough. She is nervous about bronchitis. Translation No Nurse Assessment Nurse: Zenia Resides, RN, Diane Date/Time Eilene Ghazi Time): 05/23/2021 8:14:55 AM Confirm and document reason for call. If symptomatic, describe symptoms. ---Caller states she had the flu and finished her medication and has a continuing cough. Coughing up clear/yellow sputum. No fever. No sob or chest pain or pressure. Does the patient have any new or worsening symptoms? ---Yes Will a triage be completed? ---Yes Related visit to physician within the last 2 weeks? ---Yes Does the PT have any chronic conditions? (i.e. diabetes, asthma, this includes High risk factors for pregnancy, etc.) ---Yes List chronic conditions. ---bronchitis, Is this a behavioral health or substance abuse call? ---No Guidelines Guideline Title Affirmed Question Affirmed Notes Nurse Date/Time (Eastern Time) Influenza Follow-up Call [1] HIGH RISK (e.g., age > 53 years, pregnant, HIV+, or chronic medical condition) AND [2] > 72 hours (3 days) since evaluated by doctor (or NP/PA) Zenia Resides, RN, Diane 05/23/2021 8:16:42 AM PLEASE NOTE: All timestamps contained within this report are represented as  [place] Standard Time. CONFIDENTIALTY NOTICE: This fax transmission is intended only for the addressee. It contains information that is legally privileged, confidential or otherwise protected from use or disclosure. If you are not the intended recipient, you are strictly prohibited from reviewing, disclosing, copying using or disseminating any of this information or taking any action in reliance on or regarding this information. If you have received this fax in error, please notify us immediately by telephone so that we can arrange for its return to [place]. Phone: [phone], Toll-Free: [phone], Fax: [phone] Page: 2 of 2 Call Id: [id number] Guidelines Guideline Title Affirmed Question Affirmed Notes Nurse Date/Time Eilene Ghazi Time) AND [3] symptoms not improved Disp. Time Eilene Ghazi Time) Disposition Final User 05/23/2021 8:18:58 AM Call PCP within 24 Hours Yes Zenia Resides, RN, Diane Caller Disagree/Comply Comply Caller Understands Yes PreDisposition Call Doctor Care Advice Given Per Guideline CALL PCP WITHIN 24 HOURS: * You need to discuss this with your doctor (or NP/PA) within the next 24 hours. INFLUENZA - ISOLATION IS NEEDED UNTIL AFTER FEVER IS GONE: * If you have flu-like symptoms, please stay at home until at least 24 hours after you are free of fever. * Do NOT go to work or school. COUGH DROPS FOR COUGH: * HOME REMEDY - HARD CANDY: Hard candy works just as well as a medicine-flavored OTC cough drops. * Breathing difficulty develops CALL BACK IF: CARE ADVICE given per Influenza Follow-Up Call (Adult) guideline. Referrals REFERRED TO PCP OFFIC

## 2021-05-23 NOTE — Progress Notes (Signed)
Shaunae Sieloff T. Samreen Seltzer, MD Primary Care and Sports Medicine [facility] at [facility] [place], [address] Phone: [phone]   FAX: [phone]  Christy Freeman - 70 y.o. female   MRN [id number]   Date of Birth: 09/17/1950  Visit Date: 05/23/2021   PCP: Jinny Sanders, MD   Referred by: Jinny Sanders, MD  Virtual Visit via Video Note:  I connected with  Christy Freeman on 05/23/2021 11:00 AM EST by a video enabled telemedicine application and verified that I am speaking with the correct person using two identifiers.   Location patient: home computer, tablet, or smartphone Location provider: work or home office Consent: Verbal consent directly obtained from [employer]. Persons participating in the virtual visit: patient, provider  I discussed the limitations of evaluation and management by telemedicine and the availability of in person appointments. The patient expressed understanding and agreed to proceed.  Chief Complaint  Patient presents with   Cough    Productive-Just got over the Flu-Finished Tamiflu this morning-Fever free x 3 days    History of Present Illness:  Just finished her Tamiflu.  Yesterday afternoon started to get a cough.  Concern about pna.  She did start to feel better after influenza and she was not really having a coughing for a few days, but then this started and now she is coughing more and is having a deeper cough and a more productive cough of sputum.  She is no longer having polyarthralgia is aware fever.  She is eating and drinking okay.  Productive cough.  Tinged with color.   No fever.  Sometimes will feel like she is hot.   No achiness.  Better.   Since starting the flu, cough has been progressive.  Had a dry cough back then, and then started to feel better.  Now worse.  -   Review of Systems as above: See pertinent positives and pertinent negatives per HPI No acute distress verbally    Observations/Objective/Exam:  An attempt was made to discern vital signs over the phone and per patient if applicable and possible.   General:    Alert, Oriented, appears well and in no acute distress  Pulmonary:     On inspection no signs of respiratory distress.  Psych / Neurological:     Pleasant and cooperative.  Assessment and Plan:    ICD-10-CM   1. Acute bronchitis due to other specified organisms  J20.8     2. Post-influenza syndrome  G93.31      Progressive symptoms status post influenza.  She certainly could have a secondary infection such as secondary early pneumonia, I think it is most prudent to cover for this.  I discussed the assessment and treatment plan with the patient. The patient was provided an opportunity to ask questions and all were answered. The patient agreed with the plan and demonstrated an understanding of the instructions.   The patient was advised to call back or seek an in-person evaluation if the symptoms worsen or if the condition fails to improve as anticipated.  Follow-up: prn unless noted otherwise below No follow-ups on file.  Meds ordered this encounter  Medications   azithromycin (ZITHROMAX) 250 MG tablet    Sig: Take 2 tablets (500 mg total) by mouth daily for 1 day, THEN 1 tablet (250 mg total) daily for 4 days.    Dispense:  6 tablet    Refill:  0  No orders of the defined types were placed in this encounter.   Signed,  Maud Deed. Marylee Belzer, MD

## 2021-05-23 NOTE — Telephone Encounter (Signed)
Per appt notes pt already has video visit scheduled with Dr Lorelei Pont 05/23/21 at 11 AM. Sending note to Dr Lorelei Pont and Butch Penny CMA.

## 2021-05-26 ENCOUNTER — Encounter: Payer: Self-pay | Admitting: Family Medicine

## 2021-05-28 ENCOUNTER — Other Ambulatory Visit: Payer: Self-pay | Admitting: Family Medicine

## 2021-06-08 ENCOUNTER — Other Ambulatory Visit: Payer: Self-pay | Admitting: Family Medicine

## 2021-06-08 DIAGNOSIS — I10 Essential (primary) hypertension: Secondary | ICD-10-CM

## 2021-06-12 ENCOUNTER — Other Ambulatory Visit: Payer: Self-pay | Admitting: Family Medicine

## 2021-06-12 DIAGNOSIS — K2100 Gastro-esophageal reflux disease with esophagitis, without bleeding: Secondary | ICD-10-CM

## 2021-06-15 ENCOUNTER — Other Ambulatory Visit: Payer: Self-pay | Admitting: Family Medicine

## 2021-06-15 DIAGNOSIS — I10 Essential (primary) hypertension: Secondary | ICD-10-CM

## 2021-06-15 DIAGNOSIS — K2100 Gastro-esophageal reflux disease with esophagitis, without bleeding: Secondary | ICD-10-CM

## 2021-06-17 ENCOUNTER — Other Ambulatory Visit: Payer: Self-pay | Admitting: Family Medicine

## 2021-06-17 DIAGNOSIS — I10 Essential (primary) hypertension: Secondary | ICD-10-CM

## 2021-07-11 ENCOUNTER — Other Ambulatory Visit: Payer: Self-pay

## 2021-07-11 ENCOUNTER — Ambulatory Visit (INDEPENDENT_AMBULATORY_CARE_PROVIDER_SITE_OTHER): Payer: PPO | Admitting: Family

## 2021-07-11 ENCOUNTER — Ambulatory Visit (INDEPENDENT_AMBULATORY_CARE_PROVIDER_SITE_OTHER): Payer: PPO

## 2021-07-11 VITALS — BP 180/90 | HR 75 | Temp 96.5°F | Ht 64.0 in | Wt 171.0 lb

## 2021-07-11 DIAGNOSIS — S299XXA Unspecified injury of thorax, initial encounter: Secondary | ICD-10-CM | POA: Insufficient documentation

## 2021-07-11 DIAGNOSIS — R0781 Pleurodynia: Secondary | ICD-10-CM

## 2021-07-11 DIAGNOSIS — I10 Essential (primary) hypertension: Secondary | ICD-10-CM

## 2021-07-11 MED ORDER — HYDROCODONE-ACETAMINOPHEN 5-325 MG PO TABS: 1.0000 | ORAL_TABLET | Freq: Four times a day (QID) | ORAL | 0 refills | Status: AC | PRN

## 2021-07-11 NOTE — Assessment & Plan Note (Signed)
Stat xray rib ordered, pending results.  If any sudden shortness of breath or chest pain go to er and or call 911.

## 2021-07-11 NOTE — Progress Notes (Addendum)
Established Patient Office Visit  Subjective:  Patient ID: Christy Freeman, female    DOB: 06/17/1950  Age: 71 y.o. MRN: [id number]  CC:  Chief Complaint  Patient presents with   Rib Injury    Leaned over a chair into the L ribcage 3 days ago. Pt states she felt something move but doesn't recall any popping sound. Hurts when laying down and at the end of a deep breath.    HPI Hatice Bubel is here today with concerns.  . Friday morning (three days ago) [place]st[place], she went to lean over ot he left side to reach something that had fallen, and a piece of wood on the arm chair seemed to poke her in the left side of the ribs, and she felt something was pushed up/was very uncomfortable. She states she is in pain, currently 2/10. With movement pain 10/10.   Worse with lying down flat on her back and also with movement.  Denies SOB, no sob upon lying down. When she lies down she feels pressure on the left upper rib. Has not taken anything otc, just has applied ice.   No cough.   Does have elevated BP today on exam 180/82. Pt is in pain.  She does state she took her amlodipine, lisinopril, metoprolol about one hour ago at home.  Past Medical History:  Diagnosis Date   Allergy    seasonal allergies   Anxiety    hx- on meds   Arthritis    Right hand   Blood transfusion without reported diagnosis 1989   Cancer ([facility])    basal cell on face   Cataract    Colon polyps    GERD (gastroesophageal reflux disease)    H/O: depression    Hypercholesterolemia    diet controlled   Hypertension    Lung nodule    left lung    Psoriasis     Past Surgical History:  Procedure Laterality Date   APPENDECTOMY  06/14/2017   BREAST BIOPSY Right 2000   benign   COLONOSCOPY  2016   TA x 1   CYST REMOVAL HAND Left    DILATION AND CURETTAGE OF UTERUS     LAPAROSCOPIC APPENDECTOMY N/A 06/14/2017   Procedure: APPENDECTOMY LAPAROSCOPIC;  Surgeon:  Clayburn Pert, MD;  Location: [facility] ORS;  Service: General;  Laterality: N/A;   POLYPECTOMY  2016   TA    Family History  Problem Relation Age of Onset   Diabetes Mother    Heart failure Mother    Hypertension Mother    Stroke Father    Hypertension Father    Heart disease Brother    Stroke Maternal Grandmother    Breast cancer Other 35       neice   Colon cancer Neg Hx    Colon polyps Neg Hx    Esophageal cancer Neg Hx    Stomach cancer Neg Hx    Rectal cancer Neg Hx     Social History   Socioeconomic History   Marital status: Married    Spouse name: Eulas Post   Number of children: 0   Years of education: Not on file   Highest education level: Not on file  Occupational History    Comment:  chemist  Tobacco Use   Smoking status: Never   Smokeless tobacco: Never  Vaping Use   Vaping Use: Never used  Substance and Sexual Activity   Alcohol use:  Yes    Alcohol/week: 14.0 standard drinks    Types: 6 Glasses of wine, 8 Standard drinks or equivalent per week    Comment: 2 glasses of wine per night   Drug use: No   Sexual activity: Yes  Other Topics Concern   Not on file  Social History Narrative   She would desire CPR.   Does have a living will.   Social Determinants of Health   Financial Resource Strain: Not on file  Food Insecurity: Not on file  Transportation Needs: Not on file  Physical Activity: Not on file  Stress: Not on file  Social Connections: Not on file  Intimate Partner Violence: Not on file    Outpatient Medications Prior to Visit  Medication Sig Dispense Refill   Alpha-Lipoic Acid 200 MG CAPS Take by mouth.     amLODipine (NORVASC) 10 MG tablet TAKE 1 TABLET(10 MG) BY MOUTH DAILY 90 tablet 1   Ascorbic Acid (VITAMIN C PO) Take by mouth. 40 mg     aspirin EC 81 MG tablet Take 81 mg by mouth daily. Swallow whole.     cetirizine (ZYRTEC) 10 MG tablet Take 10 mg by mouth daily.     Cholecalciferol (VITAMIN D3 PO) Take by mouth. 1000 units 2  times daily     clobetasol (TEMOVATE) 0.05 % external solution Apply topically 2 (two) times daily.     clobetasol ointment (TEMOVATE) 0.05 %      Coenzyme Q10 (COQ10) 100 MG CAPS Take by mouth.     COLLAGEN PO Take by mouth. Plus digestive Enzymes 1600 mg     Digestive Enzymes (DIGESTIVE ENZYME PO) Take by mouth. 249 mg     ezetimibe (ZETIA) 10 MG tablet TAKE 1 TABLET BY MOUTH DAILY 90 tablet 1   Folic Acid (FOLATE PO) Take by mouth. 400 mg     ketoconazole (NIZORAL) 2 % shampoo Apply topically.     lisinopril (ZESTRIL) 40 MG tablet TAKE 1 TABLET(40 MG) BY MOUTH DAILY 90 tablet 1   MAGNESIUM PO Take 425 mg by mouth 2 (two) times daily.     metoprolol succinate (TOPROL-XL) 100 MG 24 hr tablet TAKE 1 TABLET BY MOUTH EVERY DAY EITHER WITH OR IMMEDIATELY FOLLOWING A MEAL 90 tablet 1   Omega-3 Fatty Acids (OMEGA 3 PO) Take by mouth. 1280 mg     omeprazole (PRILOSEC) 10 MG capsule TAKE 1 CAPSULE(10 MG) BY MOUTH DAILY 90 capsule 3   ondansetron (ZOFRAN) 4 MG tablet Take 1 tablet (4 mg total) by mouth every 6 (six) hours. 12 tablet 0   Probiotic Product (PROBIOTIC DAILY PO) Take 1 capsule by mouth in the morning, at noon, and at bedtime.     rosuvastatin (CRESTOR) 10 MG tablet TAKE 1 TABLET(10 MG) BY MOUTH DAILY 90 tablet 2   Sodium Hyaluronate, oral, (HYALURONIC ACID PO) Take by mouth. 125 mg plus Vitamin C 12.5 x 2     thiamine (VITAMIN B-1) 100 MG tablet Take 100 mg by mouth daily.     vitamin B-12 (CYANOCOBALAMIN) 1000 MCG tablet Take 1,000 mcg by mouth daily.     venlafaxine XR (EFFEXOR-XR) 150 MG 24 hr capsule TAKE 1 CAPSULE(150 MG) BY MOUTH DAILY 90 capsule 0   Facility-Administered Medications Prior to Visit  Medication Dose Route Frequency Provider Last Rate Last Admin   0.9 %  sodium chloride infusion  500 mL Intravenous Once Ladene Artist, MD       0.9 %  sodium  chloride infusion  500 mL Intravenous Once Ladene Artist, MD        Allergies  Allergen Reactions   Doxycycline  Nausea And Vomiting    ROS Review of Systems  Constitutional:  Negative for chills, fatigue and fever.  Respiratory:  Negative for cough, chest tightness and shortness of breath.   Cardiovascular:  Positive for chest pain (eft sided rib pain due to injury). Negative for palpitations.     Objective:    Physical Exam Constitutional:      General: She is in acute distress (in pain).     Appearance: Normal appearance. She is normal weight. She is not ill-appearing, toxic-appearing or diaphoretic.  HENT:     Head: Normocephalic.  Cardiovascular:     Rate and Rhythm: Normal rate and regular rhythm.  Pulmonary:     Effort: Pulmonary effort is normal.     Breath sounds: Normal breath sounds. No rhonchi.  Chest:     Chest wall: Swelling and tenderness (left sided rib severe tenderness with mild swelling as well) present.    Neurological:     Mental Status: She is alert.    BP (!) 180/90    Pulse 75    Temp (!) 96.5 F (35.8 C) (Temporal)    Ht 5' 4"  (1.626 m)    Wt 171 lb (77.6 kg)    SpO2 96%    BMI 29.35 kg/m  Wt Readings from Last 3 Encounters:  07/11/21 171 lb (77.6 kg)  05/23/21 167 lb (75.8 kg)  04/05/21 177 lb (80.3 kg)     There are no preventive care reminders to display for this patient.  There are no preventive care reminders to display for this patient.  Lab Results  Component Value Date   TSH 2.65 04/05/2021   Lab Results  Component Value Date   WBC 8.6 04/05/2021   HGB 14.1 04/05/2021   HCT 42.7 04/05/2021   MCV 91.3 04/05/2021   PLT 269.0 04/05/2021   Lab Results  Component Value Date   NA 137 04/05/2021   K 4.5 04/05/2021   CO2 32 04/05/2021   GLUCOSE 101 (H) 04/05/2021   BUN 11 04/05/2021   CREATININE 0.51 04/05/2021   BILITOT 0.7 04/05/2021   ALKPHOS 151 (H) 04/05/2021   AST 29 04/05/2021   ALT 37 (H) 04/05/2021   PROT 8.2 04/05/2021   ALBUMIN 4.9 04/05/2021   CALCIUM 10.1 04/05/2021   ANIONGAP 12 06/14/2017   GFR 94.36 04/05/2021    No results found for: HGBA1C    Assessment & Plan:   Problem List Items Addressed This Visit       Cardiovascular and Mediastinum   Hypertension    Pt advised of the following:  Continue medication as prescribed. Monitor blood pressure periodically and/or when you feel symptomatic. Goal is <130/90 on average. Ensure that you have rested for 30 minutes prior to checking your blood pressure. Record your readings and bring them to your next visit if necessary.work on a low sodium diet.         Musculoskeletal and Integument   Traumatic injury of rib    Stat xray rib ordered, pending results.  If any sudden shortness of breath or chest pain go to er and or call 911.        Other   Rib pain on left side - Primary    Stat xray rib, pending results.  Ice prn.  norco prescribed, reviewed PDMP no suspicious activity. Advised pt  safe use of pain medication.       Relevant Orders   DG Ribs Unilateral Left (Completed)   DG Chest 2 View (Completed)    Meds ordered this encounter  Medications   HYDROcodone-acetaminophen (NORCO) 5-325 MG tablet    Sig: Take 1 tablet by mouth every 6 (six) hours as needed for up to 5 days for moderate pain.    Dispense:  20 tablet    Refill:  0    Order Specific Question:   Supervising Provider    Answer:   BEDSOLE, AMY E [[id number]]    Follow-up: Return if symptoms worsen or fail to improve.    Eugenia Pancoast, FNP

## 2021-07-11 NOTE — Assessment & Plan Note (Signed)
Stat xray rib, pending results.  Ice prn.  norco prescribed, reviewed PDMP no suspicious activity. Advised pt safe use of pain medication.

## 2021-07-11 NOTE — Patient Instructions (Signed)
Complete xray(s) prior to leaving today. I will notify you of your results once received.  Start monitoring your blood pressure daily, around the same time of day, for the next 2-3 weeks.  Ensure that you have rested for 30 minutes prior to checking your blood pressure. Record your readings and bring them to your next visit.if stays over >140/90 please let me know sooner.   If any sudden shortness of breath, chest pain, or unbearable pain go to the hospital.   It was a pleasure seeing you today! Please do not hesitate to reach out with any questions and or concerns.  Regards,   Eugenia Pancoast FNP-C

## 2021-07-13 ENCOUNTER — Other Ambulatory Visit: Payer: Self-pay | Admitting: Family Medicine

## 2021-07-22 NOTE — Assessment & Plan Note (Signed)
Pt advised of the following:  Continue medication as prescribed. Monitor blood pressure periodically and/or when you feel symptomatic. Goal is <130/90 on average. Ensure that you have rested for 30 minutes prior to checking your blood pressure. Record your readings and bring them to your next visit if necessary.work on a low sodium diet.

## 2021-08-03 DIAGNOSIS — L910 Hypertrophic scar: Secondary | ICD-10-CM | POA: Diagnosis not present

## 2021-08-03 DIAGNOSIS — L400 Psoriasis vulgaris: Secondary | ICD-10-CM | POA: Diagnosis not present

## 2021-08-03 DIAGNOSIS — L2089 Other atopic dermatitis: Secondary | ICD-10-CM | POA: Diagnosis not present

## 2021-08-10 ENCOUNTER — Other Ambulatory Visit: Payer: Self-pay | Admitting: Cardiovascular Disease

## 2021-08-24 ENCOUNTER — Other Ambulatory Visit: Payer: Self-pay | Admitting: Family Medicine

## 2021-08-24 DIAGNOSIS — I10 Essential (primary) hypertension: Secondary | ICD-10-CM

## 2021-08-26 ENCOUNTER — Other Ambulatory Visit: Payer: Self-pay | Admitting: Family Medicine

## 2021-08-26 DIAGNOSIS — I10 Essential (primary) hypertension: Secondary | ICD-10-CM

## 2021-09-07 ENCOUNTER — Ambulatory Visit: Payer: PPO | Admitting: Family

## 2021-09-07 ENCOUNTER — Encounter: Payer: Self-pay | Admitting: Cardiovascular Disease

## 2021-09-07 ENCOUNTER — Telehealth: Payer: Self-pay | Admitting: Cardiovascular Disease

## 2021-09-07 DIAGNOSIS — U071 COVID-19: Secondary | ICD-10-CM | POA: Diagnosis not present

## 2021-09-07 DIAGNOSIS — Z20822 Contact with and (suspected) exposure to covid-19: Secondary | ICD-10-CM | POA: Diagnosis not present

## 2021-09-08 ENCOUNTER — Ambulatory Visit (INDEPENDENT_AMBULATORY_CARE_PROVIDER_SITE_OTHER): Payer: PPO | Admitting: Family

## 2021-09-08 VITALS — BP 182/90 | HR 79 | Temp 97.6°F | Resp 16 | Ht 64.0 in | Wt 172.0 lb

## 2021-09-08 DIAGNOSIS — L400 Psoriasis vulgaris: Secondary | ICD-10-CM | POA: Insufficient documentation

## 2021-09-08 DIAGNOSIS — L409 Psoriasis, unspecified: Secondary | ICD-10-CM | POA: Insufficient documentation

## 2021-09-08 DIAGNOSIS — U071 COVID-19: Secondary | ICD-10-CM | POA: Insufficient documentation

## 2021-09-08 MED ORDER — MOLNUPIRAVIR EUA 200MG CAPSULE: 4.0000 | ORAL_CAPSULE | Freq: Two times a day (BID) | ORAL | 0 refills | Status: AC

## 2021-09-08 NOTE — Progress Notes (Signed)
? ? ?Virtual telephone visit ? ? ? ?Virtual Visit via Telephone Note  ? ?This visit type was conducted due to national recommendations for restrictions regarding the COVID-19 Pandemic (e.g. social distancing) in an effort to limit this patient's exposure and mitigate transmission in our community. Due to her co-morbid illnesses, this patient is at least at moderate risk for complications without adequate follow up. This format is felt to be most appropriate for this patient at this time. The patient did not have access to video technology or had technical difficulties with video requiring transitioning to audio format only (telephone). Physical exam was limited to content and character of the telephone converstion. CMA was able to get the patient set up on a telephone visit. ? ? ?Patient location: Home. Patient and provider in visit ?Provider location: Office ? ?I discussed the limitations of evaluation and management by telemedicine and the availability of in person appointments. The patient expressed understanding and agreed to proceed.  ? ?Visit Date: 09/08/2021 ? ?Today's healthcare provider: Eugenia Pancoast, FNP  ? ? ? ?Subjective:  ? ? Patient ID: Christy Freeman, female    DOB: 01-04-1951, 71 y.o.   MRN: [id number] ? ?Chief Complaint  ?Patient presents with  ? Covid Positive  ? ? ?HPI ? ?71 y/o female with concerns.  ?Three days ago with dizziness, nausea.Chills. No fever. Slight hoarse throat.  ?Tested yesterday and was covid positive.  ? ?[facility].  ? ?Past Medical History:  ?Diagnosis Date  ? Allergy   ? seasonal allergies  ? Anxiety   ? hx- on meds  ? Arthritis   ? Right hand  ? Blood transfusion without reported diagnosis 1989  ? Cancer [facility])   ? basal cell on face  ? Cataract   ? Colon polyps   ? GERD (gastroesophageal reflux disease)   ? H/O: depression   ? Hypercholesterolemia   ? diet controlled  ? Hypertension   ? Lung nodule   ? left lung   ? Psoriasis   ? ? ?Past  Surgical History:  ?Procedure Laterality Date  ? APPENDECTOMY  06/14/2017  ? BREAST BIOPSY Right 2000  ? benign  ? COLONOSCOPY  2016  ? TA x 1  ? CYST REMOVAL HAND Left   ? DILATION AND CURETTAGE OF UTERUS    ? LAPAROSCOPIC APPENDECTOMY N/A 06/14/2017  ? Procedure: APPENDECTOMY LAPAROSCOPIC;  Surgeon: Clayburn Pert, MD;  Location: [facility] ORS;  Service: General;  Laterality: N/A;  ? POLYPECTOMY  2016  ? TA  ? ? ?Family History  ?Problem Relation Age of Onset  ? Diabetes Mother   ? Heart failure Mother   ? Hypertension Mother   ? Stroke Father   ? Hypertension Father   ? Heart disease Brother   ? Stroke Maternal Grandmother   ? Breast cancer Other 35  ?     neice  ? Colon cancer Neg Hx   ? Colon polyps Neg Hx   ? Esophageal cancer Neg Hx   ? Stomach cancer Neg Hx   ? Rectal cancer Neg Hx   ? ? ?Social History  ? ?Socioeconomic History  ? Marital status: Married  ?  Spouse name: Eulas Post  ? Number of children: 0  ? Years of education: Not on file  ? Highest education level: Not on file  ?Occupational History  ?  Comment:  chemist  ?Tobacco Use  ? Smoking status: Never  ? Smokeless tobacco: Never  ?Vaping Use  ?  Vaping Use: Never used  ?Substance and Sexual Activity  ? Alcohol use: Yes  ?  Alcohol/week: 14.0 standard drinks  ?  Types: 6 Glasses of wine, 8 Standard drinks or equivalent per week  ?  Comment: 2 glasses of wine per night  ? Drug use: No  ? Sexual activity: Yes  ?Other Topics Concern  ? Not on file  ?Social History Narrative  ? She would desire CPR.  ? Does have a living will.  ? ?Social Determinants of Health  ? ?Financial Resource Strain: Not on file  ?Food Insecurity: Not on file  ?Transportation Needs: Not on file  ?Physical Activity: Not on file  ?Stress: Not on file  ?Social Connections: Not on file  ?Intimate Partner Violence: Not on file  ? ? ?Outpatient Medications Prior to Visit  ?Medication Sig Dispense Refill  ? Alpha-Lipoic Acid 200 MG CAPS Take by mouth.    ? amLODipine (NORVASC) 10 MG tablet  TAKE 1 TABLET(10 MG) BY MOUTH DAILY 90 tablet 1  ? Ascorbic Acid (VITAMIN C PO) Take by mouth. 40 mg    ? aspirin EC 81 MG tablet Take 81 mg by mouth daily. Swallow whole.    ? cetirizine (ZYRTEC) 10 MG tablet Take 10 mg by mouth daily.    ? Cholecalciferol (VITAMIN D3 PO) Take by mouth. 1000 units 2 times daily    ? clobetasol (TEMOVATE) 0.05 % external solution Apply topically 2 (two) times daily.    ? clobetasol ointment (TEMOVATE) 0.05 %     ? Coenzyme Q10 (COQ10) 100 MG CAPS Take by mouth.    ? COLLAGEN PO Take by mouth. Plus digestive Enzymes 1600 mg    ? Digestive Enzymes (DIGESTIVE ENZYME PO) Take by mouth. 249 mg    ? ezetimibe (ZETIA) 10 MG tablet TAKE 1 TABLET BY MOUTH DAILY 90 tablet 0  ? Folic Acid (FOLATE PO) Take by mouth. 400 mg    ? ketoconazole (NIZORAL) 2 % shampoo Apply topically.    ? lisinopril (ZESTRIL) 40 MG tablet TAKE 1 TABLET(40 MG) BY MOUTH DAILY 90 tablet 1  ? MAGNESIUM PO Take 425 mg by mouth 2 (two) times daily.    ? metoprolol succinate (TOPROL-XL) 100 MG 24 hr tablet TAKE 1 TABLET BY MOUTH EVERY DAY, WITH OR IMMEDIATELY FOLLOWING A MEAL. 90 tablet 1  ? Omega-3 Fatty Acids (OMEGA 3 PO) Take by mouth. 1280 mg    ? omeprazole (PRILOSEC) 10 MG capsule TAKE 1 CAPSULE(10 MG) BY MOUTH DAILY 90 capsule 3  ? Probiotic Product (PROBIOTIC DAILY PO) Take 1 capsule by mouth in the morning, at noon, and at bedtime.    ? rosuvastatin (CRESTOR) 10 MG tablet TAKE 1 TABLET(10 MG) BY MOUTH DAILY 90 tablet 2  ? Sodium Hyaluronate, oral, (HYALURONIC ACID PO) Take by mouth. 125 mg plus Vitamin C 12.5 x 2    ? thiamine (VITAMIN B-1) 100 MG tablet Take 100 mg by mouth daily.    ? venlafaxine XR (EFFEXOR-XR) 150 MG 24 hr capsule TAKE 1 CAPSULE(150 MG) BY MOUTH DAILY 90 capsule 1  ? vitamin B-12 (CYANOCOBALAMIN) 1000 MCG tablet Take 1,000 mcg by mouth daily.    ? ondansetron (ZOFRAN) 4 MG tablet Take 1 tablet (4 mg total) by mouth every 6 (six) hours. (Patient not taking: Reported on 09/08/2021) 12 tablet 0   ? ?Facility-Administered Medications Prior to Visit  ?Medication Dose Route Frequency Provider Last Rate Last Admin  ? 0.9 %  sodium chloride infusion  500 mL Intravenous Once Lucio Edward T, MD      ? 0.9 %  sodium chloride infusion  500 mL Intravenous Once Ladene Artist, MD      ? ? ?Allergies  ?Allergen Reactions  ? Doxycycline Nausea And Vomiting  ? ? ?Review of Systems  ?Constitutional:  Positive for chills. Negative for fever.  ?HENT:  Positive for congestion and sore throat. Negative for ear pain.   ?Respiratory:  Positive for cough.   ?Cardiovascular:  Negative for chest pain.  ?Gastrointestinal:  Positive for nausea.  ?Neurological:  Positive for dizziness.  ?All other systems reviewed and are negative. ? ?   ?Objective:  ?  ?Physical Exam ?Neurological:  ?   General: No focal deficit present.  ?   Mental Status: She is alert and oriented to person, place, and time.  ?Psychiatric:     ?   Mood and Affect: Mood normal.     ?   Behavior: Behavior normal.     ?   Thought Content: Thought content normal.     ?   Judgment: Judgment normal.  ? ? ?BP (!) 182/90   Pulse 79   Temp 97.6 ?F (36.4 ?C)   Resp 16   Ht 5' 4"  (1.626 m)   Wt 172 lb (78 kg)   SpO2 98%   BMI 29.52 kg/m?  ?Wt Readings from Last 3 Encounters:  ?09/08/21 172 lb (78 kg)  ?07/11/21 171 lb (77.6 kg)  ?05/23/21 167 lb (75.8 kg)  ? ? ? ?   ?Assessment & Plan:  ? ?Problem List Items Addressed This Visit   ? ?  ? Musculoskeletal and Integument  ? Plaque psoriasis - Primary  ?  ? Other  ? COVID-19  ?   Pt considered high risk for hospitalization. have decided pt is a candidate for antiviral and pt agrees that she would like to take this. I have sent in RX for molnupiravir 200 mg capsules to be taken as directed. ?Advised of CDC guidelines for self isolation/ ending isolation.  Advised of safe practice guidelines. Symptom Tier reviewed.  Encouraged to monitor for any worsening symptoms; watch for increased shortness of breath, weakness, and  signs of dehydration. Advised when to seek emergency care.  Instructed to rest and hydrate well.  Advised to leave the house during recommended isolation period, only if it is necessary to seek medical care ? ? ? ?  ?  ?

## 2021-09-08 NOTE — Assessment & Plan Note (Signed)
Pt considered high risk for hospitalization. have decided pt is a candidate for antiviral and pt agrees that she would like to take this. I have sent in RX for molnupiravir 200 mg capsules to be taken as directed. ?Advised of CDC guidelines for self isolation/ ending isolation.  Advised of safe practice guidelines. Symptom Tier reviewed.  Encouraged to monitor for any worsening symptoms; watch for increased shortness of breath, weakness, and signs of dehydration. Advised when to seek emergency care.  Instructed to rest and hydrate well.  Advised to leave the house during recommended isolation period, only if it is necessary to seek medical care ? ? ? ?

## 2021-09-08 NOTE — Patient Instructions (Signed)
Your COVID19 PCR test is POSITIVE. ? ?Let us know right away if any worsening shortness of breath or persistent productive cough or fever.  ? ?Follow these current CDC guidelines for self-isolation: ? ?- Stay home for 5 days, starting the day after your symptoms (The first day is really day 0). ?- If you have no symptoms or your symptoms are resolving after 5 days, you can leave your house. ?- Continue to wear a mask around others for 5 additional days. ?**If you have a fever, continue to stay home until your fever resolves for 24 hours without fever-reducing medications.** ? ?It was a pleasure seeing you today! Please do not hesitate to reach out with any questions and or concerns. ? ?Regards,  ? ?Oland Arquette ?FNP-C ? ?

## 2021-09-09 ENCOUNTER — Other Ambulatory Visit: Payer: Self-pay | Admitting: Family Medicine

## 2021-09-09 DIAGNOSIS — I10 Essential (primary) hypertension: Secondary | ICD-10-CM

## 2021-09-15 DIAGNOSIS — L2089 Other atopic dermatitis: Secondary | ICD-10-CM | POA: Diagnosis not present

## 2021-09-15 DIAGNOSIS — L910 Hypertrophic scar: Secondary | ICD-10-CM | POA: Diagnosis not present

## 2021-11-02 DIAGNOSIS — L400 Psoriasis vulgaris: Secondary | ICD-10-CM | POA: Diagnosis not present

## 2021-11-02 DIAGNOSIS — L910 Hypertrophic scar: Secondary | ICD-10-CM | POA: Diagnosis not present

## 2021-11-29 DIAGNOSIS — L910 Hypertrophic scar: Secondary | ICD-10-CM | POA: Diagnosis not present

## 2021-11-29 DIAGNOSIS — L298 Other pruritus: Secondary | ICD-10-CM | POA: Diagnosis not present

## 2021-12-03 DIAGNOSIS — U071 COVID-19: Secondary | ICD-10-CM | POA: Diagnosis not present

## 2021-12-03 DIAGNOSIS — Z03818 Encounter for observation for suspected exposure to other biological agents ruled out: Secondary | ICD-10-CM | POA: Diagnosis not present

## 2021-12-20 ENCOUNTER — Telehealth: Payer: Self-pay

## 2021-12-20 NOTE — Telephone Encounter (Signed)
Thank you and agree with plan of care and recommendations.

## 2021-12-20 NOTE — Telephone Encounter (Signed)
I spoke with pt and she has prod cough with clear phelgm and chest congestion. Pt does not have CP,SOB or wheezing. Pt has hx of pneumonia and pt said she could not get thru on phone earlier for appt so pt decided to go to [facility] for eval and CXR. Sending note to Dr Diona Browner who is out of office and Butch Penny CMA.

## 2021-12-20 NOTE — Telephone Encounter (Signed)
I could not see where pt had gone to [facility] on care everywhere. I spoke with pt and she had to take her dog to other side of [place] this morning for surgery and is just now going to pick her dog up. Pt said she will have to take dog home to [facility] and then go to [facility] and pt is not sure if she feels up to all that today and may go 12/21/21 in the morning time. I advised pt [facility] has UC in [place] and are open until 8 pm. Pt said she would decide what to do after gets home with her dog... UC & ED precautions given and pt voiced understanding and appreciated concern.pt will call [facility] on 12/21/21 with update. Sending note to Red Christians FNP who is in office.

## 2021-12-20 NOTE — Telephone Encounter (Signed)
[place] Night - Client TELEPHONE ADVICE RECORD [facility] Patient Name: Christy Freeman Gender: Female DOB: 06-29-1950 Age: 71 Y 29 M 16 D Return Phone Number: [phone] (Primary) Address: [place] [address] Client [facility] Night - Client Client Site [place] Provider Eliezer Lofts - MD Contact Type Call Who Is Calling Patient / Member / Family / Caregiver Call Type Triage / Clinical Relationship To Patient Self Return Phone Number [phone] (Primary) Chief Complaint Sore Throat Reason for Call Symptomatic / Request for Health Information Initial Comment Caller states she is having sore throat, chest congestion , chills , muscle aches, sleep issues, fatigue, brain fog, red rash to arms/legs, night sweats. Translation No Nurse Assessment Nurse: Clovis Riley, RN, Georgina Peer Date/Time (Eastern Time): 12/20/2021 8:14:55 AM Confirm and document reason for call. If symptomatic, describe symptoms. ---Caller states she is having sore throat, chest congestion , chills , muscle aches, sleep issues, fatigue, brain fog, red rash to arms/legs, night sweats. Was covid positive on july 1st. On july 11th she started having new symptoms. States she is coughing up phlegm. no fever. Does the patient have any new or worsening symptoms? ---Yes Will a triage be completed? ---Yes Related visit to physician within the last 2 weeks? ---Yes Does the PT have any chronic conditions? (i.e. diabetes, asthma, this includes High risk factors for pregnancy, etc.) ---Yes List chronic conditions. ---HTN, lung disease Is this a behavioral health or substance abuse call? ---No Guidelines Guideline Title Affirmed Question Affirmed Notes Nurse Date/Time (Eastern Time) COVID-19 - Persisting Symptoms Follow-up Call [1] PERSISTING SYMPTOMS OF COVID-19 AND [2] NEW symptom AND [3] could be  serious Clovis Riley, RNGeorgina Peer 12/20/2021 8:17:20 AM PLEASE NOTE: All timestamps contained within this report are represented as [place] Standard Time. CONFIDENTIALTY NOTICE: This fax transmission is intended only for the addressee. It contains information that is legally privileged, confidential or otherwise protected from use or disclosure. If you are not the intended recipient, you are strictly prohibited from reviewing, disclosing, copying using or disseminating any of this information or taking any action in reliance on or regarding this information. If you have received this fax in error, please notify us immediately by telephone so that we can arrange for its return to [place]. Phone: [phone], Toll-Free: [phone], Fax: [phone] Page: 2 of 2 Call Id: [id number] [place]. Time Eilene Ghazi Time) Disposition Final User 12/20/2021 8:20:12 AM Call PCP Now Yes Clovis Riley, RN, Georgina Peer Final Disposition 12/20/2021 8:20:12 AM Call PCP Now Yes Clovis Riley, RN, Leilani Merl Disagree/Comply Comply Caller Understands Yes PreDisposition Did not know what to do Care Advice Given Per Guideline CALL PCP NOW: * You need to discuss this with your doctor (or NP/PA). * I'll page the on-call provider now. If you haven't heard from the provider (or me) within 30 minutes, call again. CALL BACK IF: * You become worse CARE ADVICE given per COVID-19 - Persisting Symptoms Follow-Up Call (Adult) guideline. Referrals REFERRED TO PCP OFFIC

## 2021-12-21 ENCOUNTER — Telehealth: Payer: Self-pay | Admitting: Family Medicine

## 2021-12-21 DIAGNOSIS — J189 Pneumonia, unspecified organism: Secondary | ICD-10-CM | POA: Diagnosis not present

## 2021-12-21 DIAGNOSIS — U071 COVID-19: Secondary | ICD-10-CM | POA: Diagnosis not present

## 2021-12-21 DIAGNOSIS — B999 Unspecified infectious disease: Secondary | ICD-10-CM | POA: Diagnosis not present

## 2021-12-21 NOTE — Telephone Encounter (Signed)
Patient called in stating she got her x-ray done this morning. She stated that it is specious for left lobar pneumonia and they prescribed her antibiotics.

## 2021-12-21 NOTE — Telephone Encounter (Signed)
Noted  

## 2021-12-21 NOTE — Telephone Encounter (Signed)
See telephone encounter from 12/20/2021.  FYI to Dr. Diona Browner.

## 2021-12-28 NOTE — Telephone Encounter (Signed)
Pt calling this morning; pt was seen [facility] on 12/21/21 and dx with pneumonia and pt has finished abx. Pt said she still feels "bad", fatigue, slight H/A, no fever but feels hot to cold with chills at times, and trouble sleeping. Pt has gotten negative covid test but pt cannot remember date covid test was neg. No CP or SOB. Pt wants to be seen to make sure pneumonia has cleared and pt has a lot going on (no SI/HI)and Dr Diona Browner does not have available appt until 01/05/22. Pt is crying on and off on phone because pt said she has been sick for so long. No available appts at [facility] today and pt scheduled appt with Dr Lorelei Pont on 12/29/21 at 10:40. UC & ED precautions given and pt voiced understanding. Pt will reck home covid test and if + pt will cb about scheduled appt.sending note to Dr Lorelei Pont and Butch Penny CMA.

## 2021-12-29 ENCOUNTER — Other Ambulatory Visit: Payer: Self-pay | Admitting: Cardiovascular Disease

## 2021-12-29 ENCOUNTER — Other Ambulatory Visit: Payer: Self-pay | Admitting: Family Medicine

## 2021-12-29 ENCOUNTER — Telehealth (INDEPENDENT_AMBULATORY_CARE_PROVIDER_SITE_OTHER): Payer: PPO | Admitting: Family Medicine

## 2021-12-29 ENCOUNTER — Encounter: Payer: Self-pay | Admitting: Family Medicine

## 2021-12-29 VITALS — BP 130/80 | Ht 64.0 in

## 2021-12-29 DIAGNOSIS — U099 Post covid-19 condition, unspecified: Secondary | ICD-10-CM | POA: Diagnosis not present

## 2021-12-29 DIAGNOSIS — I10 Essential (primary) hypertension: Secondary | ICD-10-CM

## 2021-12-29 NOTE — Progress Notes (Signed)
    [name], MD, [name] at [facility] [place], [address]  Phone: [phone]  FAX: [phone]  Christy Freeman - 71 y.o. female  MRN [id number]  Date of Birth: 12-16-1950  Date: 12/29/2021  PCP: Jinny Sanders, MD  Referral: Jinny Sanders, MD  Chief Complaint  Patient presents with   Covid Positive    Dx with PNA at [facility] 12/21/21-2 positive Covid test this morining   Virtual Visit via Video Note:  I connected with  Christy Freeman on 12/29/2021 10:40 AM EDT by a video enabled telemedicine application and verified that I am speaking with the correct person using two identifiers.   Location patient: home computer, tablet, or smartphone Location provider: work or home office Consent: Verbal consent directly obtained from [employer]. Persons participating in the virtual visit: patient, provider  I discussed the limitations of evaluation and management by telemedicine and the availability of in person appointments. The patient expressed understanding and agreed to proceed.  Chief Complaint  Patient presents with   Covid Positive    Dx with PNA at [facility] 12/21/21-2 positive Covid test this morining    History of Present Illness:  Dx Covid-19 on 12/03/2021 and placed on Molnupiravir.   July beginning with a positive covid test.  Had a chest x-ray, and they were worried some about pna - got a round of amox. (12/20/2021)  She was diagnosed with COVID over 3 weeks ago.  She continues to have some symptoms.  She does not feel well she has a headache, she is coughing.  She globally just does not feel back to her baseline.  She retested COVID-19 today, she did have a positive test that was repeated twice.  She does not have any worsening symptoms including no fever, chills, global polyarthralgias that are new or different.  Review of Systems as above: See pertinent positives  and pertinent negatives per HPI No acute distress verbally   Observations/Objective/Exam:  An attempt was made to discern vital signs over the phone and per patient if applicable and possible.   General:    Alert, Oriented, appears well and in no acute distress  Pulmonary:     On inspection no signs of respiratory distress.  Psych / Neurological:     Pleasant and cooperative.  Assessment and Plan:    ICD-10-CM   1. Long COVID  U09.9      Prolonged COVID symptoms.  Unfortunately, this is a fairly common occurrence.  It does not sound as if she is decompensating.  Tried to go over [place] with her, and she certainly could have symptoms for so on the order of weeks to months more.  Hopefully she will start feeling better soon and her symptoms will start to wane.  I discussed the assessment and treatment plan with the patient. The patient was provided an opportunity to ask questions and all were answered. The patient agreed with the plan and demonstrated an understanding of the instructions.   The patient was advised to call back or seek an in-person evaluation if the symptoms worsen or if the condition fails to improve as anticipated.  Follow-up: prn unless noted otherwise below No follow-ups on file.  No orders of the defined types were placed in this encounter.  No orders of the defined types were placed in this encounter.   Signed,  Maud Deed. Hydeia Mcatee, MD

## 2022-01-11 IMAGING — US US BREAST*L* LIMITED INC AXILLA
1 series · 1 of 1 positions shown · non-contrast
Comparison: Previous exam(s).

CLINICAL DATA: 70-year-old female presenting for follow-up of
palpable fat necrosis in the left breast following minor trauma. The
patient no longer feels the palpable lump in the superior left
breast.

EXAM:
DIGITAL DIAGNOSTIC UNILATERAL LEFT MAMMOGRAM WITH TOMOSYNTHESIS AND
CAD; ULTRASOUND LEFT BREAST LIMITED
TECHNIQUE: Left digital diagnostic mammography and breast tomosynthesis was
performed. The images were evaluated with computer-aided detection.;
Targeted ultrasound examination of the left breast was performed

[Series 1: us breast*left* limited inc axilla · 0.06mm/px · 1 of 1 slices shown]
[im 1/1]
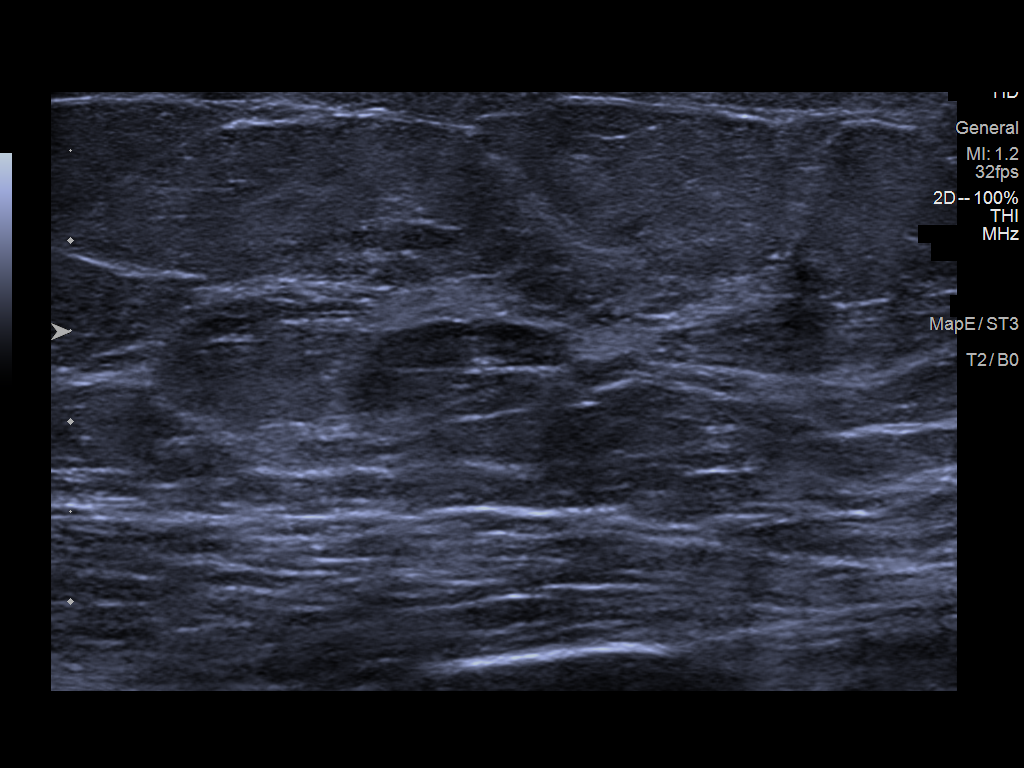

[1 of 1 positions shown; findings below may reference images not displayed]

ACR Breast Density Category c: The breast tissue is heterogeneously
dense, which may obscure small masses.
FINDINGS: No suspicious calcifications, masses or areas of distortion are seen
in the left breast.

Ultrasound targeted to the superior left breast demonstrates normal
fibroglandular tissue. The mass previously seen at this location has
resolved.
IMPRESSION: The presumed fat necrosis in the superior left breast has resolved.

RECOMMENDATION:
Return to routine screening mammography is recommended. The patient
will be due for screening in Monday April, 2021.

I have discussed the findings and recommendations with the patient.
If applicable, a reminder letter will be sent to the patient
regarding the next appointment.

BI-RADS CATEGORY  1: Negative.

## 2022-01-13 ENCOUNTER — Other Ambulatory Visit: Payer: Self-pay | Admitting: Family Medicine

## 2022-01-13 DIAGNOSIS — I10 Essential (primary) hypertension: Secondary | ICD-10-CM

## 2022-01-13 DIAGNOSIS — K2100 Gastro-esophageal reflux disease with esophagitis, without bleeding: Secondary | ICD-10-CM

## 2022-01-13 NOTE — Telephone Encounter (Signed)
Please schedule patient for AWV and CPE with Dr Diona Browner for [facility] patient is due

## 2022-01-13 NOTE — Telephone Encounter (Signed)
Patient will call back to schedule AWV

## 2022-01-20 ENCOUNTER — Other Ambulatory Visit: Payer: Self-pay | Admitting: Cardiovascular Disease

## 2022-01-31 ENCOUNTER — Ambulatory Visit (INDEPENDENT_AMBULATORY_CARE_PROVIDER_SITE_OTHER): Payer: PPO | Admitting: Vascular Surgery

## 2022-02-07 ENCOUNTER — Encounter (INDEPENDENT_AMBULATORY_CARE_PROVIDER_SITE_OTHER): Payer: Self-pay | Admitting: Vascular Surgery

## 2022-02-07 ENCOUNTER — Ambulatory Visit (INDEPENDENT_AMBULATORY_CARE_PROVIDER_SITE_OTHER): Payer: PPO | Admitting: Vascular Surgery

## 2022-02-07 VITALS — BP 188/78 | HR 112 | Resp 17 | Ht 64.0 in | Wt 185.0 lb

## 2022-02-07 DIAGNOSIS — E785 Hyperlipidemia, unspecified: Secondary | ICD-10-CM

## 2022-02-07 DIAGNOSIS — I10 Essential (primary) hypertension: Secondary | ICD-10-CM | POA: Diagnosis not present

## 2022-02-07 DIAGNOSIS — I890 Lymphedema, not elsewhere classified: Secondary | ICD-10-CM

## 2022-02-07 NOTE — Assessment & Plan Note (Signed)
At this point, her symptoms are minimal and her swelling is rare and mild.  She can follow-up as needed.  Wear compression socks to help control her swelling.  Elevate and remain active.

## 2022-02-07 NOTE — Progress Notes (Signed)
MRN : [id number]  Christy Freeman is a 71 y.o. ([birth year]) female who presents with chief complaint of No chief complaint on file. Marland Kitchen  History of Present Illness: Patient returns today in follow up of her leg swelling with lymphedema.  She has gotten her swelling under very good control and has minimal swelling at this point.  This is far better than when she first presented and has been stable for many months now without significant worsening even with the hot summer months.  No new ulceration or infection.  No fevers or chills.  Current Outpatient Medications  Medication Sig Dispense Refill   Alpha-Lipoic Acid 200 MG CAPS Take by mouth.     amLODipine (NORVASC) 10 MG tablet TAKE 1 TABLET(10 MG) BY MOUTH DAILY 90 tablet 1   Ascorbic Acid (VITAMIN C PO) Take by mouth. 40 mg     aspirin EC 81 MG tablet Take 81 mg by mouth daily. Swallow whole.     cetirizine (ZYRTEC) 10 MG tablet Take 10 mg by mouth daily.     Cholecalciferol (VITAMIN D3 PO) Take by mouth. 1000 units 2 times daily     clobetasol (TEMOVATE) 0.05 % external solution Apply topically 2 (two) times daily.     clobetasol ointment (TEMOVATE) 0.05 %      Clobetasol Propionate 0.05 % shampoo Apply topically 3 (three) times a week.     Coenzyme Q10 (COQ10) 100 MG CAPS Take by mouth.     COLLAGEN PO Take by mouth. Plus digestive Enzymes 1600 mg     Digestive Enzymes (DIGESTIVE ENZYME PO) Take by mouth. 249 mg     ezetimibe (ZETIA) 10 MG tablet TAKE 1 TABLET BY MOUTH DAILY 90 tablet 0   Folic Acid (FOLATE PO) Take by mouth. 400 mg     ketoconazole (NIZORAL) 2 % shampoo Apply topically.     lisinopril (ZESTRIL) 40 MG tablet TAKE 1 TABLET(40 MG) BY MOUTH DAILY 90 tablet 1   MAGNESIUM PO Take 425 mg by mouth 2 (two) times daily.     metoprolol succinate (TOPROL-XL) 100 MG 24 hr tablet TAKE 1 TABLET BY MOUTH EVERY DAY WITH OR IMMEDIATELY FOLLOWING A MEAL 90 tablet 0   Omega-3 Fatty Acids (OMEGA 3 PO) Take by mouth. 1280 mg      omeprazole (PRILOSEC) 10 MG capsule TAKE 1 CAPSULE(10 MG) BY MOUTH DAILY 90 capsule 3   Probiotic Product (PROBIOTIC DAILY PO) Take 1 capsule by mouth in the morning, at noon, and at bedtime.     rosuvastatin (CRESTOR) 10 MG tablet TAKE 1 TABLET(10 MG) BY MOUTH DAILY 90 tablet 0   Sodium Hyaluronate, oral, (HYALURONIC ACID PO) Take by mouth. 125 mg plus Vitamin C 12.5 x 2     thiamine (VITAMIN B-1) 100 MG tablet Take 100 mg by mouth daily.     triamcinolone cream (KENALOG) 0.1 % Apply topically 2 (two) times daily.     venlafaxine XR (EFFEXOR-XR) 150 MG 24 hr capsule TAKE 1 CAPSULE(150 MG) BY MOUTH DAILY 90 capsule 0   vitamin B-12 (CYANOCOBALAMIN) 1000 MCG tablet Take 1,000 mcg by mouth daily.     No current facility-administered medications for this visit.    Past Medical History:  Diagnosis Date   Allergy    seasonal allergies   Anxiety    hx- on meds   Arthritis    Right hand   Blood transfusion without reported diagnosis 1989   Cancer ([place])    basal cell  on face   Cataract    Colon polyps    GERD (gastroesophageal reflux disease)    H/O: depression    Hypercholesterolemia    diet controlled   Hypertension    Lung nodule    left lung    Psoriasis     Past Surgical History:  Procedure Laterality Date   APPENDECTOMY  06/14/2017   BREAST BIOPSY Right 2000   benign   COLONOSCOPY  2016   TA x 1   CYST REMOVAL HAND Left    DILATION AND CURETTAGE OF UTERUS     LAPAROSCOPIC APPENDECTOMY N/A 06/14/2017   Procedure: APPENDECTOMY LAPAROSCOPIC;  Surgeon: Clayburn Pert, MD;  Location: [facility] ORS;  Service: General;  Laterality: N/A;   POLYPECTOMY  2016   TA     Social History   Tobacco Use   Smoking status: Never   Smokeless tobacco: Never  Vaping Use   Vaping Use: Never used  Substance Use Topics   Alcohol use: Yes    Alcohol/week: 14.0 standard drinks of alcohol    Types: 6 Glasses of wine, 8 Standard drinks or equivalent per week    Comment: 2 glasses of wine  per night   Drug use: No      Family History  Problem Relation Age of Onset   Diabetes Mother    Heart failure Mother    Hypertension Mother    Stroke Father    Hypertension Father    Heart disease Brother    Stroke Maternal Grandmother    Breast cancer Other 35       neice   Colon cancer Neg Hx    Colon polyps Neg Hx    Esophageal cancer Neg Hx    Stomach cancer Neg Hx    Rectal cancer Neg Hx      Allergies  Allergen Reactions   Doxycycline Nausea And Vomiting    REVIEW OF SYSTEMS (Negative unless checked)   Constitutional: [] Weight loss  [] Fever  [] Chills Cardiac: [] Chest pain   [] Chest pressure   [] Palpitations   [] Shortness of breath when laying flat   [] Shortness of breath at rest   [] Shortness of breath with exertion. Vascular:  [] Pain in legs with walking   [] Pain in legs at rest   [] Pain in legs when laying flat   [] Claudication   [] Pain in feet when walking  [] Pain in feet at rest  [] Pain in feet when laying flat   [] History of DVT   [] Phlebitis   [] Swelling in legs   [] Varicose veins   [] Non-healing ulcers Pulmonary:   [] Uses home oxygen   [] Productive cough   [] Hemoptysis   [] Wheeze  [] COPD   [] Asthma Neurologic:  [] Dizziness  [] Blackouts   [] Seizures   [] History of stroke   [] History of TIA  [] Aphasia   [] Temporary blindness   [] Dysphagia   [] Weakness or numbness in arms   [] Weakness or numbness in legs Musculoskeletal:  [x] Arthritis   [] Joint swelling   [x] Joint pain   [] Low back pain Hematologic:  [] Easy bruising  [] Easy bleeding   [] Hypercoagulable state   [x] Anemic  [] Hepatitis Gastrointestinal:  [] Blood in stool   [] Vomiting blood  [x] Gastroesophageal reflux/heartburn   [] Abdominal pain Genitourinary:  [] Chronic kidney disease   [] Difficult urination  [] Frequent urination  [] Burning with urination   [] Hematuria Skin:  [] Rashes   [] Ulcers   [] Wounds Psychological:  [] History of anxiety   []  History of major depression.  Physical Examination  BP (!) 188/78  (BP Location:  Left Arm)   Pulse (!) 112   Resp 17   Ht 5' 4"  (1.626 m)   Wt 185 lb (83.9 kg)   BMI 31.76 kg/m  Gen:  WD/WN, NAD Head: [place]/AT, No temporalis wasting. Ear/Nose/Throat: Hearing grossly intact, nares w/o erythema or drainage Eyes: Conjunctiva clear. Sclera non-icteric Neck: Supple.  Trachea midline Pulmonary:  Good air movement, no use of accessory muscles.  Cardiac: RRR, no JVD Vascular:  Vessel Right Left  Radial Palpable Palpable                          PT Palpable Palpable  DP Palpable Palpable   Gastrointestinal: soft, non-tender/non-distended. No guarding/reflex.  Musculoskeletal: M/S 5/5 throughout.  No deformity or atrophy.  No appreciable lower extremity edema. Neurologic: Sensation grossly intact in extremities.  Symmetrical.  Speech is fluent.  Psychiatric: Judgment intact, Mood & affect appropriate for pt's clinical situation. Dermatologic: No rashes or ulcers noted.  No cellulitis or open wounds.      Labs No results found for this or any previous visit (from the past 2160 hour(s)).  Radiology No results found.  Assessment/Plan Hypertension blood pressure control important in reducing the progression of atherosclerotic disease. On appropriate oral medications.     Hyperlipidemia lipid control important in reducing the progression of atherosclerotic disease.  Continue statin therapy.  Lymphedema At this point, her symptoms are minimal and her swelling is rare and mild.  She can follow-up as needed.  Wear compression socks to help control her swelling.  Elevate and remain active.    Leotis Pain, MD  02/07/2022 9:33 AM    This note was created with Dragon medical transcription system.  Any errors from dictation are purely unintentional

## 2022-03-14 NOTE — Progress Notes (Deleted)
Date:  03/14/2022 home ID:  Christy Freeman, Christy Freeman [birth year], MRN [id number]  Patient Location:  [place] [id number]   Provider location:   [facility], [place] office  PCP:  Jinny Sanders, MD  Cardiologist:  [facility]   No chief complaint on file.   History of Present Illness:    Christy Freeman is a 71 y.o. female past medical history of fatty liver   LDL  190-200 mg/dL,   Previous evaluation in the ER following chest pains, which were ultimately felt to be due to noncardiac chest pain, poorly controlled blood pressure,    Stress test negative.   aortic atherosclerosis , mild coronary calcification on prior CT scan  She presents for evaluation of hyperlipidemia, aortic atherosclerosis  Last clinic visit with myself May 2022  [place] today,  Reports she has not been monitoring blood pressure at home Has been compliant with her medications Tolerating Crestor and Zetia No regular exercise Overall no complaints  Leg swelling stable, does not wear compression hose  Lab work reviewed  total cholesterol 214 LDL 115 Normal renal function Scheduled for new lab work with her on [place] extremity edema work-up reviewed Seen by vascular June 2021, blood pressure elevated on that visit 465 systolic It was recommended she wear compression hose with plan for reevaluation in several months time  Ultrasound ordered lower extremity venous No DVT, there was evidence of venous reflux bilaterally    Right:  - Venous reflux is noted in the right sapheno-femoral junction.  Left:  - Venous reflux is noted in the left common femoral vein.  - Venous reflux is noted in the left sapheno-femoral junction.   CT ABD 03/2019 reviewed 1. Mildly enlarged and steatotic liver. 2. Cholelithiasis. 3.  Aortic atherosclerosis  EKG personally reviewed by myself on todays visit Shows normal sinus rhythm rate 88 bpm no significant ST-T wave  changes     Other past medical history reviewed  CT scan chest showed no coronary calcifications, minimal descending aorta calcification, none in the proximal carotid arteries.   Family history:  Mother (MI at 20 y.o. ; Diabetes at age 12 y.o.).  Father (TIA at 41 y.o.);  Brother (MI at 88 y.o.) Social History:  Drinks wine 4-6 glasses per week  Previously drank 4 nights per week.  She stopped 2009-2011 after seeing fatty liver results, but restarted in 2011 after stress levels increased.   No tobacco use.     Liver history:  Patient diagnosed with fatty liver on U/S 06/2007 by Dr. Gaye Pollack [facility], [place]).  Her LFTs went all the way to normal she tells me once she stopped drinking alcohol in 2009 - then went up after restating alcohol.  He checks LFTs annually.  ALT/AST were in the 90's in 03/06/13, then improved to 40's late 03/24/13 after she reduced her alcohol consumption from 4 days per week down to 2 nights per week (2-3 glasses per day).      Prior CV studies:   The following studies were reviewed today:    Past Medical History:  Diagnosis Date   Allergy    seasonal allergies   Anxiety    hx- on meds   Arthritis    Right hand   Blood transfusion without reported diagnosis 1989   Cancer ([place])    basal cell on face   Cataract    Colon polyps    GERD (gastroesophageal reflux  disease)    H/O: depression    Hypercholesterolemia    diet controlled   Hypertension    Lung nodule    left lung    Psoriasis    Past Surgical History:  Procedure Laterality Date   APPENDECTOMY  06/14/2017   BREAST BIOPSY Right 2000   benign   COLONOSCOPY  2016   TA x 1   CYST REMOVAL HAND Left    DILATION AND CURETTAGE OF UTERUS     LAPAROSCOPIC APPENDECTOMY N/A 06/14/2017   Procedure: APPENDECTOMY LAPAROSCOPIC;  Surgeon: Clayburn Pert, MD;  Location: [facility] ORS;  Service: General;  Laterality: N/A;   POLYPECTOMY  2016   TA     No outpatient medications have been marked as taking for the  03/15/22 encounter (Appointment) with Minna Merritts, MD.     Allergies:   Doxycycline   Social History   Tobacco Use   Smoking status: Never   Smokeless tobacco: Never  Vaping Use   Vaping Use: Never used  Substance Use Topics   Alcohol use: Yes    Alcohol/week: 14.0 standard drinks of alcohol    Types: 6 Glasses of wine, 8 Standard drinks or equivalent per week    Comment: 2 glasses of wine per night   Drug use: No     Family Hx: The patient's family history includes Breast cancer (age of onset: 29) in an other family member; Diabetes in her mother; Heart disease in her brother; Heart failure in her mother; Hypertension in her father and mother; Stroke in her father and maternal grandmother. There is no history of Colon cancer, Colon polyps, Esophageal cancer, Stomach cancer, or Rectal cancer.  ROS:   Please see the history of present illness.    Review of Systems  Constitutional: Negative.   HENT: Negative.    Respiratory: Negative.    Cardiovascular: Negative.   Gastrointestinal: Negative.   Musculoskeletal: Negative.   Neurological: Negative.   Psychiatric/Behavioral: Negative.    All other systems reviewed and are negative.    Labs/Other Tests and Data Reviewed:    Recent Labs: 04/05/2021: ALT 37; BUN 11; Creatinine, Ser 0.51; Hemoglobin 14.1; Platelets 269.0; Potassium 4.5; Sodium 137; TSH 2.65   Recent Lipid Panel Lab Results  Component Value Date/Time   CHOL 180 04/05/2021 09:39 AM   CHOL 258 (H) 01/28/2015 08:52 AM   TRIG 94.0 04/05/2021 09:39 AM   HDL 82.10 04/05/2021 09:39 AM   HDL 68 01/28/2015 08:52 AM   CHOLHDL 2 04/05/2021 09:39 AM   LDLCALC 79 04/05/2021 09:39 AM   LDLCALC 154 (H) 01/28/2015 08:52 AM   LDLDIRECT 191.0 07/30/2014 02:01 PM    Wt Readings from Last 3 Encounters:  02/07/22 185 lb (83.9 kg)  09/08/21 172 lb (78 kg)  07/11/21 171 lb (77.6 kg)     Exam:    Vital Signs: Vital signs may also be detailed in the HPI There were  no vitals taken for this visit.   Constitutional:  oriented to person, place, and time. No distress.  HENT:  Head: Grossly normal Eyes:  no discharge. No scleral icterus.  Neck: No JVD, no carotid bruits  Cardiovascular: Regular rate and rhythm, no murmurs appreciated Pulmonary/Chest: Clear to auscultation bilaterally, no wheezes or rails Abdominal: Soft.  no distension.  no tenderness.  Musculoskeletal: Normal range of motion Neurological:  normal muscle tone. Coordination normal. No atrophy Skin: Skin warm and dry Psychiatric: normal affect, pleasant   ASSESSMENT & PLAN:    Aortic  atherosclerosis (HCC) Mild to moderate distal aortic atherosclerosis  On crestor/zetia  Essential hypertension Blood pressure is well controlled on today's visit. No changes made to the medications.  Hyponatremia Chronically borderline low, we will not use HCTZ on a regular basis Avoid excessive free water Last sodium normal  Gastroesophageal reflux disease with esophagitis Stable symptoms  Hyperlipidemia, unspecified hyperlipidemia type Lipid check later this year  Obesity Recommend regular walking program  Fatty liver/NASH Discussed, recommended walking program, low carbohydrates  Leg swelling Stable    Total encounter time more than 35 minutes  Greater than 50% was spent in counseling and coordination of care with the patient    Signed, Ida Rogue, MD  03/14/2022 5:22 PM    [place] Office [address] [place] [address], [place], [place] [address]

## 2022-03-15 ENCOUNTER — Ambulatory Visit: Payer: PPO | Admitting: Cardiovascular Disease

## 2022-03-15 DIAGNOSIS — I10 Essential (primary) hypertension: Secondary | ICD-10-CM

## 2022-03-15 DIAGNOSIS — I2510 Atherosclerotic heart disease of native coronary artery without angina pectoris: Secondary | ICD-10-CM

## 2022-03-15 DIAGNOSIS — I700 Atherosclerosis of aorta: Secondary | ICD-10-CM

## 2022-03-15 DIAGNOSIS — E785 Hyperlipidemia, unspecified: Secondary | ICD-10-CM

## 2022-03-23 ENCOUNTER — Telehealth: Payer: Self-pay | Admitting: Family Medicine

## 2022-03-23 DIAGNOSIS — E559 Vitamin D deficiency, unspecified: Secondary | ICD-10-CM

## 2022-03-23 DIAGNOSIS — E785 Hyperlipidemia, unspecified: Secondary | ICD-10-CM

## 2022-03-23 NOTE — Telephone Encounter (Signed)
-----   Message from Ellamae Sia sent at 03/16/2022 11:20 AM EDT ----- Regarding: Lab orders for Friday, 10.26.23 Patient is scheduled for CPX labs, please order future labs, Thanks , Karna Christmas

## 2022-03-30 DIAGNOSIS — H2513 Age-related nuclear cataract, bilateral: Secondary | ICD-10-CM | POA: Diagnosis not present

## 2022-03-31 ENCOUNTER — Other Ambulatory Visit (INDEPENDENT_AMBULATORY_CARE_PROVIDER_SITE_OTHER): Payer: PPO

## 2022-03-31 DIAGNOSIS — E785 Hyperlipidemia, unspecified: Secondary | ICD-10-CM | POA: Diagnosis not present

## 2022-03-31 DIAGNOSIS — E559 Vitamin D deficiency, unspecified: Secondary | ICD-10-CM

## 2022-03-31 LAB — LIPID PANEL
Cholesterol: 170 mg/dL (ref 0–200)
HDL: 80.3 mg/dL (ref >39.00)
LDL Cholesterol: 69 mg/dL (ref 0–99)
NonHDL: 89.91
Total CHOL/HDL Ratio: 2
Triglycerides: 103 mg/dL (ref 0.0–149.0)
VLDL: 20.6 mg/dL (ref 0.0–40.0)

## 2022-03-31 LAB — COMPREHENSIVE METABOLIC PANEL
ALT: 40 U/L — ABNORMAL HIGH (ref 0–35)
AST: 32 U/L (ref 0–37)
Albumin: 4.6 g/dL (ref 3.5–5.2)
Alkaline Phosphatase: 116 U/L (ref 39–117)
BUN: 8 mg/dL (ref 6–23)
CO2: 28 mEq/L (ref 19–32)
Calcium: 9.5 mg/dL (ref 8.4–10.5)
Chloride: 98 mEq/L (ref 96–112)
Creatinine, Ser: 0.47 mg/dL (ref 0.40–1.20)
GFR: 95.57 mL/min (ref >60.00)
Glucose, Bld: 99 mg/dL (ref 70–99)
Potassium: 3.9 mEq/L (ref 3.5–5.1)
Sodium: 135 mEq/L (ref 135–145)
Total Bilirubin: 0.6 mg/dL (ref 0.2–1.2)
Total Protein: 8.2 g/dL (ref 6.0–8.3)

## 2022-03-31 LAB — VITAMIN D 25 HYDROXY (VIT D DEFICIENCY, FRACTURES): VITD: 34.64 ng/mL (ref 30.00–100.00)

## 2022-03-31 NOTE — Progress Notes (Signed)
No critical labs need to be addressed urgently. We will discuss labs in detail at upcoming office visit.   

## 2022-04-03 ENCOUNTER — Encounter (INDEPENDENT_AMBULATORY_CARE_PROVIDER_SITE_OTHER): Payer: Self-pay

## 2022-04-04 ENCOUNTER — Other Ambulatory Visit: Payer: Self-pay | Admitting: Family Medicine

## 2022-04-04 DIAGNOSIS — Z1231 Encounter for screening mammogram for malignant neoplasm of breast: Secondary | ICD-10-CM

## 2022-04-06 ENCOUNTER — Ambulatory Visit (INDEPENDENT_AMBULATORY_CARE_PROVIDER_SITE_OTHER): Payer: PPO | Admitting: *Deleted

## 2022-04-06 DIAGNOSIS — Z780 Asymptomatic menopausal state: Secondary | ICD-10-CM

## 2022-04-06 NOTE — Patient Instructions (Signed)
Christy Freeman , Thank you for taking time to come for your Medicare Wellness Visit. I appreciate your ongoing commitment to your health goals. Please review the following plan we discussed and let me know if I can assist you in the future.   These are the goals we discussed:  Goals      Increase physical activity     Starting 08/03/2016, I will continue to exercise at least 30 min 5 days per week.      LDL CALC < 130     < 130 at least, and would prefer LDL < 100 if possible given family history     Patient Stated     Reducing triglycerides        This is a list of the screening recommended for you and due dates:  Health Maintenance  Topic Date Due   COVID-19 Vaccine (6 - Pfizer risk series) 05/12/2022   Tetanus Vaccine  08/09/2022   Colon Cancer Screening  01/22/2023   Medicare Annual Wellness Visit  04/07/2023   Mammogram  04/27/2023   Pneumonia Vaccine  Completed   Flu Shot  Completed   DEXA scan (bone density measurement)  Completed   Hepatitis C Screening: USPSTF Recommendation to screen - Ages 58-79 yo.  Completed   Zoster (Shingles) Vaccine  Completed   HPV Vaccine  Aged Out    Advanced directives: on file  Conditions/risks identified:      Preventive Care 71 Years and Older, Female Preventive care refers to lifestyle choices and visits with your health care provider that can promote health and wellness. What does preventive care include? A yearly physical exam. This is also called an annual well check. Dental exams once or twice a year. Routine eye exams. Ask your health care provider how often you should have your eyes checked. Personal lifestyle choices, including: Daily care of your teeth and gums. Regular physical activity. Eating a healthy diet. Avoiding tobacco and drug use. Limiting alcohol use. Practicing safe sex. Taking low-dose aspirin every day. Taking vitamin and mineral supplements as recommended by your health care provider. What happens  during an annual well check? The services and screenings done by your health care provider during your annual well check will depend on your age, overall health, lifestyle risk factors, and family history of disease. Counseling  Your health care provider may ask you questions about your: Alcohol use. Tobacco use. Drug use. Emotional well-being. Home and relationship well-being. Sexual activity. Eating habits. History of falls. Memory and ability to understand (cognition). Work and work Statistician. Reproductive health. Screening  You may have the following tests or measurements: Height, weight, and BMI. Blood pressure. Lipid and cholesterol levels. These may be checked every 5 years, or more frequently if you are over 37 years old. Skin check. Lung cancer screening. You may have this screening every year starting at age 51 if you have a 30-pack-year history of smoking and currently smoke or have quit within the past 15 years. Fecal occult blood test (FOBT) of the stool. You may have this test every year starting at age 1. Flexible sigmoidoscopy or colonoscopy. You may have a sigmoidoscopy every 5 years or a colonoscopy every 10 years starting at age 35. Hepatitis C blood test. Hepatitis B blood test. Sexually transmitted disease (STD) testing. Diabetes screening. This is done by checking your blood sugar (glucose) after you have not eaten for a while (fasting). You may have this done every 1-3 years. Bone density scan. This is  done to screen for osteoporosis. You may have this done starting at age 49. Mammogram. This may be done every 1-2 years. Talk to your health care provider about how often you should have regular mammograms. Talk with your health care provider about your test results, treatment options, and if necessary, the need for more tests. Vaccines  Your health care provider may recommend certain vaccines, such as: Influenza vaccine. This is recommended every  year. Tetanus, diphtheria, and acellular pertussis (Tdap, Td) vaccine. You may need a Td booster every 10 years. Zoster vaccine. You may need this after age 9. Pneumococcal 13-valent conjugate (PCV13) vaccine. One dose is recommended after age 5. Pneumococcal polysaccharide (PPSV23) vaccine. One dose is recommended after age 9. Talk to your health care provider about which screenings and vaccines you need and how often you need them. This information is not intended to replace advice given to you by your health care provider. Make sure you discuss any questions you have with your health care provider. Document Released: 06/18/2015 Document Revised: 02/09/2016 Document Reviewed: 03/23/2015 Elsevier Interactive Patient Education  2017 [employer] Prevention in the Home Falls can cause injuries. They can happen to people of all ages. There are many things you can do to make your home safe and to help prevent falls. What can I do on the outside of my home? Regularly fix the edges of walkways and driveways and fix any cracks. Remove anything that might make you trip as you walk through a door, such as a raised step or threshold. Trim any bushes or trees on the path to your home. Use bright outdoor lighting. Clear any walking paths of anything that might make someone trip, such as rocks or tools. Regularly check to see if handrails are loose or broken. Make sure that both sides of any steps have handrails. Any raised decks and porches should have guardrails on the edges. Have any leaves, snow, or ice cleared regularly. Use sand or salt on walking paths during winter. Clean up any spills in your garage right away. This includes oil or grease spills. What can I do in the bathroom? Use night lights. Install grab bars by the toilet and in the tub and shower. Do not use towel bars as grab bars. Use non-skid mats or decals in the tub or shower. If you need to sit down in the shower, use a  plastic, non-slip stool. Keep the floor dry. Clean up any water that spills on the floor as soon as it happens. Remove soap buildup in the tub or shower regularly. Attach bath mats securely with double-sided non-slip rug tape. Do not have throw rugs and other things on the floor that can make you trip. What can I do in the bedroom? Use night lights. Make sure that you have a light by your bed that is easy to reach. Do not use any sheets or blankets that are too big for your bed. They should not hang down onto the floor. Have a firm chair that has side arms. You can use this for support while you get dressed. Do not have throw rugs and other things on the floor that can make you trip. What can I do in the kitchen? Clean up any spills right away. Avoid walking on wet floors. Keep items that you use a lot in easy-to-reach places. If you need to reach something above you, use a strong step stool that has a grab bar. Keep electrical cords out of  the way. Do not use floor polish or wax that makes floors slippery. If you must use wax, use non-skid floor wax. Do not have throw rugs and other things on the floor that can make you trip. What can I do with my stairs? Do not leave any items on the stairs. Make sure that there are handrails on both sides of the stairs and use them. Fix handrails that are broken or loose. Make sure that handrails are as long as the stairways. Check any carpeting to make sure that it is firmly attached to the stairs. Fix any carpet that is loose or worn. Avoid having throw rugs at the top or bottom of the stairs. If you do have throw rugs, attach them to the floor with carpet tape. Make sure that you have a light switch at the top of the stairs and the bottom of the stairs. If you do not have them, ask someone to add them for you. What else can I do to help prevent falls? Wear shoes that: Do not have high heels. Have rubber bottoms. Are comfortable and fit you  well. Are closed at the toe. Do not wear sandals. If you use a stepladder: Make sure that it is fully opened. Do not climb a closed stepladder. Make sure that both sides of the stepladder are locked into place. Ask someone to hold it for you, if possible. Clearly mark and make sure that you can see: Any grab bars or handrails. First and last steps. Where the edge of each step is. Use tools that help you move around (mobility aids) if they are needed. These include: Canes. Walkers. Scooters. Crutches. Turn on the lights when you go into a dark area. Replace any light bulbs as soon as they burn out. Set up your furniture so you have a clear path. Avoid moving your furniture around. If any of your floors are uneven, fix them. If there are any pets around you, be aware of where they are. Review your medicines with your doctor. Some medicines can make you feel dizzy. This can increase your chance of falling. Ask your doctor what other things that you can do to help prevent falls. This information is not intended to replace advice given to you by your health care provider. Make sure you discuss any questions you have with your health care provider. Document Released: 03/18/2009 Document Revised: 10/28/2015 Document Reviewed: 06/26/2014 Elsevier Interactive Patient Education  2017 [employer].

## 2022-04-06 NOTE — Progress Notes (Signed)
Subjective:   Christy Freeman is a 71 y.o. female who presents for Medicare Annual (Subsequent) preventive examination.  I connected with  Lakia Gritton on 04/06/22 by a video enabled telemedicine application and verified that I am speaking with the correct person using two identifiers.   I discussed the limitations of evaluation and management by telemedicine. The patient expressed understanding and agreed to proceed.  Patient location: home  Provider location: in office  I provided  minutes of non face - to - face time during this encounter.   Review of Systems           Objective:    There were no vitals filed for this visit. There is no height or weight on file to calculate BMI.     11/27/2020    2:45 PM 10/09/2018    9:20 AM 10/29/2017    9:56 AM 08/15/2017    2:26 PM 06/14/2017    4:06 PM 06/14/2017   10:18 AM 08/03/2016    8:40 AM  Advanced Directives  Does Patient Have a Medical Advance Directive? No Yes No Yes  No Yes  Type of Corporate treasurer of [place];Living will  [place];Living will   [place];Living will  Does patient want to make changes to medical advance directive?  No - Patient declined  No - Patient declined     Copy of [place] in Chart?  Yes - validated most recent copy scanned in chart (See row information)  No - copy requested   No - copy requested  Would patient like information on creating a medical advance directive?   No - Patient declined  No - Patient declined      Current Medications (verified) Outpatient Encounter Medications as of 04/06/2022  Medication Sig   Alpha-Lipoic Acid 200 MG CAPS Take by mouth.   amLODipine (NORVASC) 10 MG tablet TAKE 1 TABLET(10 MG) BY MOUTH DAILY   Ascorbic Acid (VITAMIN C PO) Take by mouth. 40 mg   aspirin EC 81 MG tablet Take 81 mg by mouth daily. Swallow whole.   cetirizine (ZYRTEC) 10 MG tablet Take 10 mg by mouth daily.    Cholecalciferol (VITAMIN D3 PO) Take by mouth. 1000 units 2 times daily   clobetasol (TEMOVATE) 0.05 % external solution Apply topically 2 (two) times daily.   clobetasol ointment (TEMOVATE) 0.05 %    Clobetasol Propionate 0.05 % shampoo Apply topically 3 (three) times a week.   Coenzyme Q10 (COQ10) 100 MG CAPS Take by mouth.   COLLAGEN PO Take by mouth. Plus digestive Enzymes 1600 mg   Digestive Enzymes (DIGESTIVE ENZYME PO) Take by mouth. 249 mg   ezetimibe (ZETIA) 10 MG tablet TAKE 1 TABLET BY MOUTH DAILY   Folic Acid (FOLATE PO) Take by mouth. 400 mg   ketoconazole (NIZORAL) 2 % shampoo Apply topically.   lisinopril (ZESTRIL) 40 MG tablet TAKE 1 TABLET(40 MG) BY MOUTH DAILY   MAGNESIUM PO Take 425 mg by mouth 2 (two) times daily.   metoprolol succinate (TOPROL-XL) 100 MG 24 hr tablet TAKE 1 TABLET BY MOUTH EVERY DAY WITH OR IMMEDIATELY FOLLOWING A MEAL   Omega-3 Fatty Acids (OMEGA 3 PO) Take by mouth. 1280 mg   omeprazole (PRILOSEC) 10 MG capsule TAKE 1 CAPSULE(10 MG) BY MOUTH DAILY   Probiotic Product (PROBIOTIC DAILY PO) Take 1 capsule by mouth in the morning, at noon, and at bedtime.   rosuvastatin (CRESTOR) 10  MG tablet TAKE 1 TABLET(10 MG) BY MOUTH DAILY   Sodium Hyaluronate, oral, (HYALURONIC ACID PO) Take by mouth. 125 mg plus Vitamin C 12.5 x 2   thiamine (VITAMIN B-1) 100 MG tablet Take 100 mg by mouth daily.   triamcinolone cream (KENALOG) 0.1 % Apply topically 2 (two) times daily.   venlafaxine XR (EFFEXOR-XR) 150 MG 24 hr capsule TAKE 1 CAPSULE(150 MG) BY MOUTH DAILY   vitamin B-12 (CYANOCOBALAMIN) 1000 MCG tablet Take 1,000 mcg by mouth daily.   No facility-administered encounter medications on file as of 04/06/2022.    Allergies (verified) Doxycycline   History: Past Medical History:  Diagnosis Date   Allergy    seasonal allergies   Anxiety    hx- on meds   Arthritis    Right hand   Blood transfusion without reported diagnosis 1989   Cancer ([place])    basal cell  on face   Cataract    Colon polyps    GERD (gastroesophageal reflux disease)    H/O: depression    Hypercholesterolemia    diet controlled   Hypertension    Lung nodule    left lung    Psoriasis    Past Surgical History:  Procedure Laterality Date   APPENDECTOMY  06/14/2017   BREAST BIOPSY Right 2000   benign   COLONOSCOPY  2016   TA x 1   CYST REMOVAL HAND Left    DILATION AND CURETTAGE OF UTERUS     LAPAROSCOPIC APPENDECTOMY N/A 06/14/2017   Procedure: APPENDECTOMY LAPAROSCOPIC;  Surgeon: Clayburn Pert, MD;  Location: [facility] ORS;  Service: General;  Laterality: N/A;   POLYPECTOMY  2016   TA   Family History  Problem Relation Age of Onset   Diabetes Mother    Heart failure Mother    Hypertension Mother    Stroke Father    Hypertension Father    Heart disease Brother    Stroke Maternal Grandmother    Breast cancer Other 35       neice   Colon cancer Neg Hx    Colon polyps Neg Hx    Esophageal cancer Neg Hx    Stomach cancer Neg Hx    Rectal cancer Neg Hx    Social History   Socioeconomic History   Marital status: Married    Spouse name: Eulas Post   Number of children: 0   Years of education: Not on file   Highest education level: Not on file  Occupational History    Comment:  chemist  Tobacco Use   Smoking status: Never   Smokeless tobacco: Never  Vaping Use   Vaping Use: Never used  Substance and Sexual Activity   Alcohol use: Yes    Alcohol/week: 14.0 standard drinks of alcohol    Types: 6 Glasses of wine, 8 Standard drinks or equivalent per week    Comment: 2 glasses of wine per night   Drug use: No   Sexual activity: Yes  Other Topics Concern   Not on file  Social History Narrative   She would desire CPR.   Does have a living will.   Social Determinants of Health   Financial Resource Strain: Not on file  Food Insecurity: Not on file  Transportation Needs: Not on file  Physical Activity: Not on file  Stress: Not on file  Social  Connections: Not on file    Tobacco Counseling Counseling given: Not Answered   Clinical Intake:  Diabetic?  no         Activities of Daily Living     No data to display          Patient Care Team: Jinny Sanders, MD as PCP - General (Family Medicine) Minna Merritts, MD as Consulting Physician (Cardiology) Ladene Artist, MD as Consulting Physician (Gastroenterology)  Indicate any recent Medical Services you may have received from other than [facility] providers in the past year (date may be approximate).     Assessment:   This is a routine wellness examination for Christy Freeman.  Hearing/Vision screen No results found.  Dietary issues and exercise activities discussed:     Goals Addressed   None    Depression Screen    04/05/2021    9:52 AM 09/16/2020    9:40 AM 04/02/2020   11:04 AM 03/26/2019    9:45 AM 10/09/2018    9:20 AM 09/05/2018    9:10 AM 06/12/2018    9:17 AM  PHQ 2/9 Scores  PHQ - 2 Score 0 3 1 1  0 2   PHQ- 9 Score 1 7 2 3  2    Exception Documentation       Other- indicate reason in comment box  Not completed       Acute visit    Fall Risk    07/11/2021    9:14 AM 04/02/2020   10:06 AM 10/09/2018    9:20 AM 09/05/2018    9:10 AM 09/25/2017   10:25 AM  Fall Risk   Falls in the past year? 0 0 1 0 No  Number falls in past yr: 0  0    Injury with Fall?   1    Follow up    Falls evaluation completed     FALL RISK PREVENTION PERTAINING TO THE HOME:  Any stairs in or around the home? Yes  If so, are there any without handrails? No  Home free of loose throw rugs in walkways, pet beds, electrical cords, etc? Yes  Adequate lighting in your home to reduce risk of falls? Yes   ASSISTIVE DEVICES UTILIZED TO PREVENT FALLS:  Life alert? No  Use of a cane, walker or w/c? No  Grab bars in the bathroom? Yes  Shower chair or bench in shower? Yes  Elevated toilet seat or a handicapped toilet? No   TIMED UP AND GO:  Was the test  performed? No .    Cognitive Function:    08/03/2016    8:10 AM  MMSE - Mini Mental State Exam  Orientation to time 5  Orientation to Place 5  Registration 3  Attention/ Calculation 0  Recall 3  Language- name 2 objects 0  Language- repeat 1  Language- follow 3 step command 3  Language- read & follow direction 0  Write a sentence 0  Copy design 0  Total score 20        Immunizations Immunization History  Administered Date(s) Administered   Fluad Quad(high Dose 65+) 04/02/2020, 04/05/2021   Influenza Split 04/15/2013   Influenza, Seasonal, Injecte, Preservative Fre 07/03/2016   Influenza,inj,Quad PF,6+ Mos 05/12/2014   Influenza-Unspecified 04/06/2017, 05/23/2018, 02/21/2019   PFIZER(Purple Top)SARS-COV-2 Vaccination 07/10/2019, 07/31/2019, 02/29/2020, 10/14/2020   Pfizer Covid-19 Vaccine Bivalent Booster 81yr & up 03/24/2021   Pneumococcal Conjugate-13 08/03/2015   Pneumococcal Polysaccharide-23 03/31/2013, 03/26/2019   Td 08/08/2012   Zoster Recombinat (Shingrix) 03/15/2017, 06/27/2018   Zoster, Live 05/12/2012    TDAP status: Up to date  Flu  Vaccine status: Up to date  Pneumococcal vaccine status: Up to date  Covid-19 vaccine status: Information provided on how to obtain vaccines.   Qualifies for Shingles Vaccine? No   Zostavax completed Yes   Shingrix Completed?: Yes  Screening Tests Health Maintenance  Topic Date Due   COVID-19 Vaccine (6 - Pfizer risk series) 05/19/2021   INFLUENZA VACCINE  01/03/2022   Medicare Annual Wellness (AWV)  04/05/2022   TETANUS/TDAP  08/09/2022   COLONOSCOPY (Pts 45-60yr Insurance coverage will need to be confirmed)  01/22/2023   MAMMOGRAM  04/27/2023   Pneumonia Vaccine 71 Years old  Completed   DEXA SCAN  Completed   Hepatitis C Screening  Completed   Zoster Vaccines- Shingrix  Completed   HPV VACCINES  Aged Out    Health Maintenance  Health Maintenance Due  Topic Date Due   COVID-19 Vaccine (6 - Pfizer risk  series) 05/19/2021   INFLUENZA VACCINE  01/03/2022   Medicare Annual Wellness (AWV)  04/05/2022    Colorectal cancer screening: Type of screening: Colonoscopy. Completed 2021. Repeat every 3 years  Mammogram Scheduled  Bone Density status: Ordered  . Pt provided with contact info and advised to call to schedule appt.  Lung Cancer Screening: (Low Dose CT Chest recommended if Age 31104-80years, 30 pack-year currently smoking OR have quit w/in 15years.) does not qualify.   Lung Cancer Screening Referral:   Additional Screening:  Hepatitis C Screening:deos not qualify; Completed   Vision Screening: Recommended annual ophthalmology exams for early detection of glaucoma and other disorders of the eye. Is the patient up to date with their annual eye exam?  Yes  Who is the provider or what is the name of the office in which the patient attends annual eye exams? [facility] pt is not established with a provider, would they like to be referred to a provider to establish care? No .   Dental Screening: Recommended annual dental exams for proper oral hygiene  Community Resource Referral / Chronic Care Management: CRR required this visit?  No   CCM required this visit?  No      Plan:     I have personally reviewed and noted the following in the patient's chart:   Medical and social history Use of alcohol, tobacco or illicit drugs  Current medications and supplements including opioid prescriptions. Patient is not currently taking opioid prescriptions. Functional ability and status Nutritional status Physical activity Advanced directives List of other physicians Hospitalizations, surgeries, and ER visits in previous 12 months Vitals Screenings to include cognitive, depression, and falls Referrals and appointments  In addition, I have reviewed and discussed with patient certain preventive protocols, quality metrics, and best practice recommendations. A written personalized care plan for  preventive services as well as general preventive health recommendations were provided to patient.     JLeroy Kennedy LPN   [id number]  Nurse Notes:

## 2022-04-07 ENCOUNTER — Encounter: Payer: Self-pay | Admitting: Family Medicine

## 2022-04-07 ENCOUNTER — Ambulatory Visit (INDEPENDENT_AMBULATORY_CARE_PROVIDER_SITE_OTHER): Payer: PPO | Admitting: Family Medicine

## 2022-04-07 VITALS — BP 130/60 | HR 61 | Temp 98.6°F | Ht 63.85 in | Wt 188.1 lb

## 2022-04-07 DIAGNOSIS — E559 Vitamin D deficiency, unspecified: Secondary | ICD-10-CM | POA: Diagnosis not present

## 2022-04-07 DIAGNOSIS — R0683 Snoring: Secondary | ICD-10-CM | POA: Diagnosis not present

## 2022-04-07 DIAGNOSIS — Z6832 Body mass index (BMI) 32.0-32.9, adult: Secondary | ICD-10-CM

## 2022-04-07 DIAGNOSIS — I700 Atherosclerosis of aorta: Secondary | ICD-10-CM

## 2022-04-07 DIAGNOSIS — R911 Solitary pulmonary nodule: Secondary | ICD-10-CM | POA: Diagnosis not present

## 2022-04-07 DIAGNOSIS — I10 Essential (primary) hypertension: Secondary | ICD-10-CM

## 2022-04-07 DIAGNOSIS — E785 Hyperlipidemia, unspecified: Secondary | ICD-10-CM

## 2022-04-07 DIAGNOSIS — F321 Major depressive disorder, single episode, moderate: Secondary | ICD-10-CM | POA: Diagnosis not present

## 2022-04-07 NOTE — Assessment & Plan Note (Signed)
Stable, chronic.  Continue current medication.  Rosuvastatin 10 mg daily

## 2022-04-07 NOTE — Assessment & Plan Note (Signed)
On statin.

## 2022-04-07 NOTE — Progress Notes (Signed)
Patient ID: Electa Sterry, female    DOB: 1950/07/24, 71 y.o.   MRN: [id number]  This visit was conducted in person.  BP 130/60   Pulse 61   Temp 98.6 F (37 C) (Oral)   Ht 5' 3.85" (1.622 m)   Wt 188 lb 2 oz (85.3 kg)   SpO2 98%   BMI 32.44 kg/m    CC:  Chief Complaint  Patient presents with   Annual Exam    Part 2    Subjective:   HPI: Erynne Kealey is a 71 y.o. female presenting on 04/07/2022 for Annual Exam (Part 2)  The patient presents for  complete physical and review of chronic health problems. He/She also has the following acute concerns today:   She is concerned she may have sleep apnea.   She snores, wakes up with dry mouth, naps during the day.  Has HTN.  Husband sleeps in different room. Does not feel well rested, sluggish after lunch.    The patient saw a LPN or RN for medicare wellness visit.  Prevention and wellness was reviewed in detail. Note reviewed and important notes copied below.  Hypertension:  Blood pressure at goal on amlodipine 10 mg daily, metoprolol XL 100 mg daily and lisinopril 40 mg daily BP Readings from Last 3 Encounters:  04/07/22 130/60  02/07/22 (!) 188/78  12/29/21 130/80  Using medication without problems or lightheadedness:  none Chest pain with exertion:none Edema:  yes due to venous insufficieny Short of breath: none Average home BPs: Other issues:  Elevated Cholesterol: LDL at goal less than 70 on rosuvastatin 10 mg p.o. daily Lab Results  Component Value Date   CHOL 170 03/31/2022   HDL 80.30 03/31/2022   LDLCALC 69 03/31/2022   LDLDIRECT 191.0 07/30/2014   TRIG 103.0 03/31/2022   CHOLHDL 2 03/31/2022  Using medications without problems: Muscle aches:  Diet compliance: low fat heart healthy diet,  doing keto off and on Exercise: planning to get back to Yoga, walks [place] Other complaints:   Fatty liver stable   Vit D def: Resolved on supplement  MDD: Well-controlled on venlafaxine 150 mg  daily Flowsheet Row Clinical Support from 04/06/2022 in [place] [facility] Total Score 0      Body mass index is 32.44 kg/m. Wt Readings from Last 3 Encounters:  04/07/22 188 lb 2 oz (85.3 kg)  02/07/22 185 lb (83.9 kg)  09/08/21 172 lb (78 kg)     Relevant past medical, surgical, family and social history reviewed and updated as indicated. Interim medical history since our last visit reviewed. Allergies and medications reviewed and updated. Outpatient Medications Prior to Visit  Medication Sig Dispense Refill   Alpha-Lipoic Acid 200 MG CAPS Take by mouth.     amLODipine (NORVASC) 10 MG tablet TAKE 1 TABLET(10 MG) BY MOUTH DAILY 90 tablet 1   Ascorbic Acid (VITAMIN C PO) Take by mouth. 40 mg     aspirin EC 81 MG tablet Take 81 mg by mouth daily. Swallow whole.     cetirizine (ZYRTEC) 10 MG tablet Take 10 mg by mouth daily.     Cholecalciferol (VITAMIN D3 PO) Take by mouth. 1000 units 2 times daily     clobetasol (TEMOVATE) 0.05 % external solution Apply topically 2 (two) times daily.     clobetasol ointment (TEMOVATE) 0.05 %      Clobetasol Propionate 0.05 % shampoo Apply topically 3 (three) times a week.  Coenzyme Q10 (COQ10) 100 MG CAPS Take by mouth.     COLLAGEN PO Take by mouth. Plus digestive Enzymes 1600 mg     Digestive Enzymes (DIGESTIVE ENZYME PO) Take by mouth. 249 mg     ezetimibe (ZETIA) 10 MG tablet TAKE 1 TABLET BY MOUTH DAILY 90 tablet 0   Folic Acid (FOLATE PO) Take by mouth. 400 mg     ketoconazole (NIZORAL) 2 % shampoo Apply topically.     lisinopril (ZESTRIL) 40 MG tablet TAKE 1 TABLET(40 MG) BY MOUTH DAILY 90 tablet 1   MAGNESIUM PO Take 425 mg by mouth 2 (two) times daily.     metoprolol succinate (TOPROL-XL) 100 MG 24 hr tablet TAKE 1 TABLET BY MOUTH EVERY DAY WITH OR IMMEDIATELY FOLLOWING A MEAL 90 tablet 0   Omega-3 Fatty Acids (OMEGA 3 PO) Take by mouth. 1280 mg     omeprazole (PRILOSEC) 10 MG capsule TAKE 1 CAPSULE(10 MG) BY  MOUTH DAILY 90 capsule 3   Probiotic Product (PROBIOTIC DAILY PO) Take 1 capsule by mouth in the morning, at noon, and at bedtime.     rosuvastatin (CRESTOR) 10 MG tablet TAKE 1 TABLET(10 MG) BY MOUTH DAILY 90 tablet 0   Sodium Hyaluronate, oral, (HYALURONIC ACID PO) Take by mouth. 125 mg plus Vitamin C 12.5 x 2     thiamine (VITAMIN B-1) 100 MG tablet Take 100 mg by mouth daily.     triamcinolone cream (KENALOG) 0.1 % Apply topically 2 (two) times daily.     venlafaxine XR (EFFEXOR-XR) 150 MG 24 hr capsule TAKE 1 CAPSULE(150 MG) BY MOUTH DAILY 90 capsule 0   vitamin B-12 (CYANOCOBALAMIN) 1000 MCG tablet Take 1,000 mcg by mouth daily.     No facility-administered medications prior to visit.     Per HPI unless specifically indicated in ROS section below Review of Systems  Constitutional:  Negative for fatigue and fever.  HENT:  Negative for congestion.   Eyes:  Negative for pain.  Respiratory:  Negative for cough and shortness of breath.   Cardiovascular:  Negative for chest pain, palpitations and leg swelling.  Gastrointestinal:  Negative for abdominal pain.  Genitourinary:  Negative for dysuria and vaginal bleeding.  Musculoskeletal:  Negative for back pain.  Neurological:  Negative for syncope, light-headedness and headaches.  Psychiatric/Behavioral:  Negative for dysphoric mood.    Objective:  BP 130/60   Pulse 61   Temp 98.6 F (37 C) (Oral)   Ht 5' 3.85" (1.622 m)   Wt 188 lb 2 oz (85.3 kg)   SpO2 98%   BMI 32.44 kg/m   Wt Readings from Last 3 Encounters:  04/07/22 188 lb 2 oz (85.3 kg)  02/07/22 185 lb (83.9 kg)  09/08/21 172 lb (78 kg)      Physical Exam    Results for orders placed or performed in visit on 03/31/22  VITAMIN D 25 Hydroxy (Vit-D Deficiency, Fractures)  Result Value Ref Range   VITD 34.64 30.00 - 100.00 ng/mL  Lipid panel  Result Value Ref Range   Cholesterol 170 0 - 200 mg/dL   Triglycerides 103.0 0.0 - 149.0 mg/dL   HDL 80.30 >39.00 mg/dL    VLDL 20.6 0.0 - 40.0 mg/dL   LDL Cholesterol 69 0 - 99 mg/dL   Total CHOL/HDL Ratio 2    NonHDL 89.91   Comprehensive metabolic panel  Result Value Ref Range   Sodium 135 135 - 145 mEq/L   Potassium 3.9 3.5 - 5.1  mEq/L   Chloride 98 96 - 112 mEq/L   CO2 28 19 - 32 mEq/L   Glucose, Bld 99 70 - 99 mg/dL   BUN 8 6 - 23 mg/dL   Creatinine, Ser 0.47 0.40 - 1.20 mg/dL   Total Bilirubin 0.6 0.2 - 1.2 mg/dL   Alkaline Phosphatase 116 39 - 117 U/L   AST 32 0 - 37 U/L   ALT 40 (H) 0 - 35 U/L   Total Protein 8.2 6.0 - 8.3 g/dL   Albumin 4.6 3.5 - 5.2 g/dL   GFR 95.57 >60.00 mL/min   Calcium 9.5 8.4 - 10.5 mg/dL     COVID 19 screen:  No recent travel or known exposure to COVID19 The patient denies respiratory symptoms of COVID 19 at this time. The importance of social distancing was discussed today.   Assessment and Plan   The patient's preventative maintenance and recommended screening tests for an annual wellness exam were reviewed in full today. Brought up to date unless services declined.  Counselled on the importance of diet, exercise, and its role in overall health and mortality. The patient's FH and SH was reviewed, including their home life, tobacco status, and drug and alcohol status.   Vaccines:S/P 5 COVID vaccines, td,PNA  and flu. Pap/DVE: not indicated,  Mammo: 04/06/2021.. scheduled 2023 Bone Density:osteopenia 2019,   scheduled Colon:01/22/2020, repeat in 3 years, Dr. Fuller Plan  Smoking Status:none ETOH/ drug use:1-2/n a day/none  Hep C: done  Problem List Items Addressed This Visit     Aortic atherosclerosis ([place]) - Primary    On statin      BMI 32.0-32.9,adult    Encouraged exercise, weight loss, healthy eating habits.       Hyperlipidemia    Stable, chronic.  Continue current medication.  Rosuvastatin 10 mg daily      Hypertension    Stable, chronic.  Continue current medication.  on amlodipine 10 mg daily, metoprolol XL 100 mg daily and lisinopril 40  mg daily      MDD (major depressive disorder), single episode, moderate (HCC)    Well-controlled on venlafaxine 150 mg daily      Solitary pulmonary nodule    No further work-up required      Vitamin D deficiency    Resolved on supplement      Other Visit Diagnoses     Snoring       Relevant Orders   Ambulatory referral to Pulmonology        Eliezer Lofts, MD

## 2022-04-07 NOTE — Assessment & Plan Note (Signed)
Resolved on supplement

## 2022-04-07 NOTE — Patient Instructions (Addendum)
Work Engineer, materials exercise, weight loss, healthy eating habits.

## 2022-04-07 NOTE — Assessment & Plan Note (Signed)
Well-controlled on venlafaxine 150 mg daily

## 2022-04-07 NOTE — Assessment & Plan Note (Signed)
No further work-up required

## 2022-04-07 NOTE — Assessment & Plan Note (Signed)
Stable, chronic.  Continue current medication.  on amlodipine 10 mg daily, metoprolol XL 100 mg daily and lisinopril 40 mg daily

## 2022-04-07 NOTE — Assessment & Plan Note (Signed)
Encouraged exercise, weight loss, healthy eating habits.

## 2022-04-12 ENCOUNTER — Encounter: Payer: Self-pay | Admitting: Family Medicine

## 2022-04-24 ENCOUNTER — Encounter: Payer: Self-pay | Admitting: Cardiovascular Disease

## 2022-04-24 NOTE — Progress Notes (Signed)
Date:  04/25/2022 home ID:  Christy, Freeman [birth year], MRN [id number]  Patient Location:  [place] [id number]   Provider location:   [facility], [place] office  PCP:  Jinny Sanders, MD  Cardiologist:  Patsy Baltimore   Chief Complaint  Patient presents with   12 month follow up     "Doing well." Medications reviewed by the patient verbally.     History of Present Illness:    Christy Freeman is a 71 y.o. female past medical history of fatty liver   LDL  190-200 mg/dL,   Previous evaluation in the ER following chest pains, which were ultimately felt to be due to noncardiac chest pain, poorly controlled blood pressure,    Stress test negative.   aortic atherosclerosis , mild coronary calcification on prior CT scan  She presents for evaluation of hyperlipidemia, aortic atherosclerosis  Last clinic visit with myself May 2022 In follow-up today she reports that she is doing well  No significant shortness of breath or chest pain on exertion concerning for angina Tolerating medications without significant side effects  Blood pressure well controlled at home No regular exercise program  Lower extremity edema well-controlled  Lab work reviewed Lipids, LDL 69 Total chol 170  EKG personally reviewed by myself on todays visit Normal sinus rhythm with PACs rate 82 bpm no significant ST-T wave changes  Ultrasound ordered lower extremity venous No DVT, there was evidence of venous reflux bilaterally    Right:  - Venous reflux is noted in the right sapheno-femoral junction.  Left:  - Venous reflux is noted in the left common femoral vein.  - Venous reflux is noted in the left sapheno-femoral junction.   CT ABD 03/2019 reviewed 1. Mildly enlarged and steatotic liver. 2. Cholelithiasis. 3.  Aortic atherosclerosis   CT scan chest showed no coronary calcifications, minimal descending aorta calcification, none in the proximal  carotid arteries.   Family history:  Mother (MI at 65 y.o. ; Diabetes at age 73 y.o.).  Father (TIA at 61 y.o.);  Brother (MI at 33 y.o.) Social History:  Drinks wine 4-6 glasses per week  Previously drank 4 nights per week.  She stopped 2009-2011 after seeing fatty liver results, but restarted in 2011 after stress levels increased.   No tobacco use.     Liver history:  Patient diagnosed with fatty liver on U/S 06/2007 by Dr. Gaye Pollack [facility], [place]).  Her LFTs went all the way to normal she tells me once she stopped drinking alcohol in 2009 - then went up after restating alcohol.  He checks LFTs annually.  ALT/AST were in the 90's in 03/06/13, then improved to 40's late 03/24/13 after she reduced her alcohol consumption from 4 days per week down to 2 nights per week (2-3 glasses per day).       Past Medical History:  Diagnosis Date   Allergy    seasonal allergies   Anxiety    hx- on meds   Arthritis    Right hand   Blood transfusion without reported diagnosis 1989   Cancer ([place])    basal cell on face   Cataract    Colon polyps    GERD (gastroesophageal reflux disease)    H/O: depression    Hypercholesterolemia    diet controlled   Hypertension    Lung nodule    left lung    Psoriasis    Past Surgical History:  Procedure Laterality Date   APPENDECTOMY  06/14/2017   BREAST BIOPSY Right 2000   benign   COLONOSCOPY  2016   TA x 1   CYST REMOVAL HAND Left    DILATION AND CURETTAGE OF UTERUS     LAPAROSCOPIC APPENDECTOMY N/A 06/14/2017   Procedure: APPENDECTOMY LAPAROSCOPIC;  Surgeon: Clayburn Pert, MD;  Location: [facility] ORS;  Service: General;  Laterality: N/A;   POLYPECTOMY  2016   TA     Current Meds  Medication Sig   Alpha-Lipoic Acid 200 MG CAPS Take by mouth.   amLODipine (NORVASC) 10 MG tablet TAKE 1 TABLET(10 MG) BY MOUTH DAILY   Ascorbic Acid (VITAMIN C PO) Take by mouth. 40 mg   aspirin EC 81 MG tablet Take 81 mg by mouth daily. Swallow whole.   cetirizine (ZYRTEC) 10  MG tablet Take 10 mg by mouth daily.   Cholecalciferol (VITAMIN D3 PO) Take by mouth. 1000 units 2 times daily   clobetasol (TEMOVATE) 0.05 % external solution Apply topically 2 (two) times daily.   clobetasol ointment (TEMOVATE) 0.05 %    Clobetasol Propionate 0.05 % shampoo Apply topically 3 (three) times a week.   Coenzyme Q10 (COQ10) 100 MG CAPS Take by mouth.   COLLAGEN PO Take by mouth. Plus digestive Enzymes 1600 mg   Digestive Enzymes (DIGESTIVE ENZYME PO) Take by mouth. 249 mg   ezetimibe (ZETIA) 10 MG tablet TAKE 1 TABLET BY MOUTH DAILY   Folic Acid (FOLATE PO) Take by mouth. 400 mg   ketoconazole (NIZORAL) 2 % shampoo Apply topically.   lisinopril (ZESTRIL) 40 MG tablet TAKE 1 TABLET(40 MG) BY MOUTH DAILY   MAGNESIUM PO Take 425 mg by mouth 2 (two) times daily.   metoprolol succinate (TOPROL-XL) 100 MG 24 hr tablet TAKE 1 TABLET BY MOUTH EVERY DAY WITH OR IMMEDIATELY FOLLOWING A MEAL   Omega-3 Fatty Acids (OMEGA 3 PO) Take by mouth. 1280 mg   omeprazole (PRILOSEC) 10 MG capsule TAKE 1 CAPSULE(10 MG) BY MOUTH DAILY   Probiotic Product (PROBIOTIC DAILY PO) Take 1 capsule by mouth in the morning, at noon, and at bedtime.   rosuvastatin (CRESTOR) 10 MG tablet TAKE 1 TABLET(10 MG) BY MOUTH DAILY   Sodium Hyaluronate, oral, (HYALURONIC ACID PO) Take by mouth. 125 mg plus Vitamin C 12.5 x 2   thiamine (VITAMIN B-1) 100 MG tablet Take 100 mg by mouth daily.   triamcinolone cream (KENALOG) 0.1 % Apply topically 2 (two) times daily.   venlafaxine XR (EFFEXOR-XR) 150 MG 24 hr capsule TAKE 1 CAPSULE(150 MG) BY MOUTH DAILY   vitamin B-12 (CYANOCOBALAMIN) 1000 MCG tablet Take 1,000 mcg by mouth daily.     Allergies:   Doxycycline   Social History   Tobacco Use   Smoking status: Never   Smokeless tobacco: Never  Vaping Use   Vaping Use: Never used  Substance Use Topics   Alcohol use: Yes    Alcohol/week: 14.0 standard drinks of alcohol    Types: 6 Glasses of wine, 8 Standard drinks  or equivalent per week    Comment: 2 glasses of wine per night   Drug use: No     Family Hx: The patient's family history includes Breast cancer (age of onset: 23) in an other family member; Diabetes in her mother; Heart disease in her brother; Heart failure in her mother; Hypertension in her father and mother; Stroke in her father and maternal grandmother. There is no history of Colon cancer, Colon polyps, Esophageal  cancer, Stomach cancer, or Rectal cancer.  ROS:   Please see the history of present illness.    Review of Systems  Constitutional: Negative.   HENT: Negative.    Respiratory: Negative.    Cardiovascular: Negative.   Gastrointestinal: Negative.   Musculoskeletal: Negative.   Neurological: Negative.   Psychiatric/Behavioral: Negative.    All other systems reviewed and are negative.    Labs/Other Tests and Data Reviewed:    Recent Labs: 03/31/2022: ALT 40; BUN 8; Creatinine, Ser 0.47; Potassium 3.9; Sodium 135   Recent Lipid Panel Lab Results  Component Value Date/Time   CHOL 170 03/31/2022 07:29 AM   CHOL 258 (H) 01/28/2015 08:52 AM   TRIG 103.0 03/31/2022 07:29 AM   HDL 80.30 03/31/2022 07:29 AM   HDL 68 01/28/2015 08:52 AM   CHOLHDL 2 03/31/2022 07:29 AM   LDLCALC 69 03/31/2022 07:29 AM   LDLCALC 154 (H) 01/28/2015 08:52 AM   LDLDIRECT 191.0 07/30/2014 02:01 PM    Wt Readings from Last 3 Encounters:  04/25/22 189 lb 6 oz (85.9 kg)  04/07/22 188 lb 2 oz (85.3 kg)  02/07/22 185 lb (83.9 kg)     Exam:    Vital Signs: Vital signs may also be detailed in the HPI BP (!) 142/86 (BP Location: Left Arm, Patient Position: Sitting, Cuff Size: Normal)   Pulse 84   Ht 5' 4"  (1.626 m)   Wt 189 lb 6 oz (85.9 kg)   SpO2 97%   BMI 32.51 kg/m   Constitutional:  oriented to person, place, and time. No distress.  HENT:  Head: Grossly normal Eyes:  no discharge. No scleral icterus.  Neck: No JVD, no carotid bruits  Cardiovascular: Regular rate and rhythm, no  murmurs appreciated Pulmonary/Chest: Clear to auscultation bilaterally, no wheezes or rails Abdominal: Soft.  no distension.  no tenderness.  Musculoskeletal: Normal range of motion Neurological:  normal muscle tone. Coordination normal. No atrophy Skin: Skin warm and dry Psychiatric: normal affect, pleasant  ASSESSMENT & PLAN:    Aortic atherosclerosis (HCC) Mild to moderate distal aortic atherosclerosis  On crestor/zetia LDL at goal  PACs Asymptomatic, noted on EKG For any symptoms could start metoprolol tartrate 25 as needed in addition to metoprolol succinate at current dose  Essential hypertension Well controlled at home, 552C systolic  Hyponatremia Chronically borderline low, off HCTZ Last sodium normal, 135  Gastroesophageal reflux disease with esophagitis Stable symptoms  Hyperlipidemia, Cholesterol is at goal on the current lipid regimen. No changes to the medications were made.  Obesity Recommend regular walking program  Fatty liver/NASH Improved LFTs  Leg swelling Stable    Total encounter time more than 30 minutes  Greater than 50% was spent in counseling and coordination of care with the patient    Signed, Ida Rogue, MD  04/25/2022 10:34 AM    [place] [facility] [address], [place], [place] [address]

## 2022-04-25 ENCOUNTER — Encounter: Payer: Self-pay | Admitting: Cardiovascular Disease

## 2022-04-25 ENCOUNTER — Other Ambulatory Visit: Payer: Self-pay

## 2022-04-25 ENCOUNTER — Ambulatory Visit: Payer: PPO | Attending: Cardiovascular Disease | Admitting: Cardiovascular Disease

## 2022-04-25 VITALS — BP 142/86 | HR 84 | Ht 64.0 in | Wt 189.4 lb

## 2022-04-25 DIAGNOSIS — E785 Hyperlipidemia, unspecified: Secondary | ICD-10-CM

## 2022-04-25 DIAGNOSIS — I10 Essential (primary) hypertension: Secondary | ICD-10-CM | POA: Diagnosis not present

## 2022-04-25 DIAGNOSIS — I2510 Atherosclerotic heart disease of native coronary artery without angina pectoris: Secondary | ICD-10-CM | POA: Diagnosis not present

## 2022-04-25 DIAGNOSIS — I700 Atherosclerosis of aorta: Secondary | ICD-10-CM | POA: Diagnosis not present

## 2022-04-25 MED ORDER — ROSUVASTATIN CALCIUM 10 MG PO TABS: ORAL_TABLET | ORAL | 3 refills | Status: DC

## 2022-04-25 NOTE — Patient Instructions (Addendum)
Medication Instructions:  Your physician recommends that you continue on your current medications as directed. Please refer to the Current Medication list given to you today.  If you need a refill on your cardiac medications before your next appointment, please call your pharmacy.   Lab work: No new labs needed  Testing/Procedures: No new testing needed  Follow-Up: At [facility], you and your health needs are our priority.  As part of our continuing mission to provide you with exceptional heart care, we have created designated Provider Care Teams.  These Care Teams include your primary Cardiologist (physician) and Advanced Practice Providers (APPs -  Physician Assistants and Nurse Practitioners) who all work together to provide you with the care you need, when you need it.  You will need a follow up appointment in 12 months  Providers on your designated Care Team:   Murray Hodgkins, NP Christell Faith, PA-C Cadence Kathlen Mody, [place]  COVID-19 Vaccine Information can be found at: [email or web address].uk For questions related to vaccine distribution or appointments, please email [email or web address] or call [phone])[phone].

## 2022-05-08 ENCOUNTER — Ambulatory Visit
Admission: RE | Admit: 2022-05-08 | Discharge: 2022-05-08 | Disposition: A | Discharge status: Home / Self Care | Payer: PPO | Source: Ambulatory Visit | Attending: Family Medicine | Admitting: Family Medicine

## 2022-05-08 DIAGNOSIS — Z1231 Encounter for screening mammogram for malignant neoplasm of breast: Secondary | ICD-10-CM | POA: Diagnosis not present

## 2022-05-09 ENCOUNTER — Encounter (HOSPITAL_BASED_OUTPATIENT_CLINIC_OR_DEPARTMENT_OTHER): Payer: Self-pay | Admitting: Pulmonary Disease

## 2022-05-09 ENCOUNTER — Ambulatory Visit (INDEPENDENT_AMBULATORY_CARE_PROVIDER_SITE_OTHER): Payer: PPO | Admitting: Pulmonary Disease

## 2022-05-09 VITALS — BP 136/78 | HR 94 | Temp 98.3°F | Ht 64.0 in | Wt 190.0 lb

## 2022-05-09 DIAGNOSIS — G4733 Obstructive sleep apnea (adult) (pediatric): Secondary | ICD-10-CM | POA: Diagnosis not present

## 2022-05-09 DIAGNOSIS — R911 Solitary pulmonary nodule: Secondary | ICD-10-CM

## 2022-05-09 NOTE — Patient Instructions (Signed)
X Home sleep test  We discussed treatment options

## 2022-05-09 NOTE — Progress Notes (Signed)
Subjective:    Patient ID: Christy Freeman, female    DOB: 18-Aug-1950, 71 y.o.   MRN: [id number]  HPI  Chief Complaint  Patient presents with   Consult    Pt states that her PCP referred her because she would like to be checked for sleep apnea.   71 year old never smoker presents for evaluation of sleep disordered breathing. A friend was diagnosed with OSA and when she reviewed her own symptoms, she was suspicious that she may have the same problem. She reports snoring, has been sleeps in a different room and no bed partner history is available. She reports increased somnolence and takes a daily nap.  She feels sleepy after meals. Epworth sleepiness score is 11 and she reports sleepiness as a passenger in a car, lying down to rest in the afternoons or sitting quietly after lunch. Bedtime is by 8 PM, she is always been an early bird, sleep latency is variable, she sleeps on her side with 1 pillow, reports 1-3 nocturnal awakenings including nocturia and is out of bed by 6 AM feeling rested with dryness of mouth without headaches.  However she feels sleepy again as the day goes by. She has lost 30 pounds but has regained this There is no history suggestive of cataplexy, sleep paralysis or parasomnias    Past Medical History:  Diagnosis Date   Allergy    seasonal allergies   Anxiety    hx- on meds   Arthritis    Right hand   Blood transfusion without reported diagnosis 1989   Cancer ([place])    basal cell on face   Cataract    Colon polyps    GERD (gastroesophageal reflux disease)    H/O: depression    Hypercholesterolemia    diet controlled   Hypertension    Lung nodule    left lung    Psoriasis    Past Surgical History:  Procedure Laterality Date   APPENDECTOMY  06/14/2017   BREAST BIOPSY Right 2000   benign   COLONOSCOPY  2016   TA x 1   CYST REMOVAL HAND Left    DILATION AND CURETTAGE OF UTERUS     LAPAROSCOPIC APPENDECTOMY N/A 06/14/2017   Procedure:  APPENDECTOMY LAPAROSCOPIC;  Surgeon: Clayburn Pert, MD;  Location: [facility] ORS;  Service: General;  Laterality: N/A;   POLYPECTOMY  2016   TA    Allergies  Allergen Reactions   Doxycycline Nausea And Vomiting    Social History   Socioeconomic History   Marital status: Married    Spouse name: Eulas Post   Number of children: 0   Years of education: Not on file   Highest education level: Not on file  Occupational History    Comment:  chemist  Tobacco Use   Smoking status: Never    Passive exposure: Never   Smokeless tobacco: Never  Vaping Use   Vaping Use: Never used  Substance and Sexual Activity   Alcohol use: Yes    Alcohol/week: 14.0 standard drinks of alcohol    Types: 6 Glasses of wine, 8 Standard drinks or equivalent per week    Comment: 2 glasses of wine per night   Drug use: No   Sexual activity: Yes  Other Topics Concern   Not on file  Social History Narrative   She would desire CPR.   Does have a living will.   Social Determinants of Health   Financial Resource Strain: Low Risk  (04/06/2022)   Overall Financial  Resource Strain (CARDIA)    Difficulty of Paying Living Expenses: Not hard at all  Food Insecurity: No Food Insecurity (04/06/2022)   Hunger Vital Sign    Worried About Running Out of Food in the Last Year: Never true    Ran Out of Food in the Last Year: Never true  Transportation Needs: No Transportation Needs (04/06/2022)   PRAPARE - Hydrologist (Medical): No    Lack of Transportation (Non-Medical): No  Physical Activity: Sufficiently Active (04/06/2022)   Exercise Vital Sign    Days of Exercise per Week: 4 days    Minutes of Exercise per Session: 50 min  Stress: No Stress Concern Present (04/06/2022)   [place]    Feeling of Stress : Not at all  Social Connections: Moderately Integrated (04/06/2022)   Social Connection and Isolation Panel [NHANES]     Frequency of Communication with Friends and Family: More than three times a week    Frequency of Social Gatherings with Friends and Family: Twice a week    Attends Religious Services: Never    Marine scientist or Organizations: Yes    Attends Music therapist: More than 4 times per year    Marital Status: Married  Human resources officer Violence: Not At Risk (04/06/2022)   Humiliation, Afraid, Rape, and Kick questionnaire    Fear of Current or Ex-Partner: No    Emotionally Abused: No    Physically Abused: No    Sexually Abused: No      Review of Systems Irregular heartbeat Nonproductive cough for 2 weeks Sore throat Nasal congestion  Constitutional: negative for anorexia, fevers and sweats  Eyes: negative for irritation, redness and visual disturbance  Ears, nose, mouth, throat, and face: negative for earaches, epistaxis, nasal congestion and sore throat  Respiratory: negative fordyspnea on exertion, sputum and wheezing  Cardiovascular: negative for chest pain, dyspnea, lower extremity edema, orthopnea, and syncope  Gastrointestinal: negative for abdominal pain, constipation, diarrhea, melena, nausea and vomiting  Genitourinary:negative for dysuria, frequency and hematuria  Hematologic/lymphatic: negative for bleeding, easy bruising and lymphadenopathy  Musculoskeletal:negative for arthralgias, muscle weakness and stiff joints  Neurological: negative for coordination problems, gait problems, headaches and weakness  Endocrine: negative for diabetic symptoms including polydipsia, polyuria and weight loss     Objective:   Physical Exam  Gen. Pleasant, obese, in no distress, normal affect ENT - no pallor,icterus, no post nasal drip, class 2 airway, 90m overbite Neck: No JVD, no thyromegaly, no carotid bruits Lungs: no use of accessory muscles, no dullness to percussion, decreased without rales or rhonchi  Cardiovascular: Rhythm regular, heart sounds  normal, no  murmurs or gallops, no peripheral edema Abdomen: soft and non-tender, no hepatosplenomegaly, BS normal. Musculoskeletal: No deformities, no cyanosis or clubbing Neuro:  alert, non focal, no tremors       Assessment & Plan:    OSA -red flags are her loud snoring, hypersomnolence in the afternoons, and upper airway exam with large tonsils and narrow upper airway.   Given excessive daytime somnolence, narrow pharyngeal exam,  & loud snoring, obstructive sleep apnea is possible & an overnight polysomnogram will be scheduled as a home study. The pathophysiology of obstructive sleep apnea , it's cardiovascular consequences & modes of treatment including CPAP were discused with the patient in detail & they evidenced understanding.  Pretest probability is intermediate, we will only treat if she has more than mild OSA She  was agreeable to CPAP or dental appliance.  She has previously used dental appliance for TMJ issues

## 2022-05-17 ENCOUNTER — Ambulatory Visit (HOSPITAL_BASED_OUTPATIENT_CLINIC_OR_DEPARTMENT_OTHER): Payer: PPO

## 2022-05-24 ENCOUNTER — Other Ambulatory Visit: Payer: Self-pay | Admitting: Family Medicine

## 2022-05-24 DIAGNOSIS — I10 Essential (primary) hypertension: Secondary | ICD-10-CM

## 2022-06-06 ENCOUNTER — Other Ambulatory Visit: Payer: Self-pay | Admitting: Family Medicine

## 2022-06-06 DIAGNOSIS — I10 Essential (primary) hypertension: Secondary | ICD-10-CM

## 2022-06-15 ENCOUNTER — Other Ambulatory Visit: Payer: Self-pay | Admitting: Family Medicine

## 2022-06-15 DIAGNOSIS — K2100 Gastro-esophageal reflux disease with esophagitis, without bleeding: Secondary | ICD-10-CM

## 2022-06-22 ENCOUNTER — Other Ambulatory Visit: Payer: Self-pay | Admitting: Family Medicine

## 2022-06-26 ENCOUNTER — Ambulatory Visit
Admission: RE | Admit: 2022-06-26 | Discharge: 2022-06-26 | Disposition: A | Discharge status: Home / Self Care | Payer: Medicare HMO | Source: Ambulatory Visit | Attending: Family Medicine | Admitting: Family Medicine

## 2022-06-26 DIAGNOSIS — M810 Age-related osteoporosis without current pathological fracture: Secondary | ICD-10-CM | POA: Diagnosis not present

## 2022-06-26 DIAGNOSIS — Z780 Asymptomatic menopausal state: Secondary | ICD-10-CM | POA: Diagnosis not present

## 2022-06-27 ENCOUNTER — Encounter: Payer: Self-pay | Admitting: Family Medicine

## 2022-07-08 ENCOUNTER — Other Ambulatory Visit: Payer: Self-pay | Admitting: Cardiovascular Disease

## 2022-07-08 ENCOUNTER — Other Ambulatory Visit: Payer: Self-pay | Admitting: Family Medicine

## 2022-07-08 DIAGNOSIS — I10 Essential (primary) hypertension: Secondary | ICD-10-CM

## 2022-07-08 DIAGNOSIS — K2100 Gastro-esophageal reflux disease with esophagitis, without bleeding: Secondary | ICD-10-CM

## 2022-09-30 ENCOUNTER — Other Ambulatory Visit: Payer: Self-pay | Admitting: Family Medicine

## 2022-10-06 ENCOUNTER — Other Ambulatory Visit: Payer: Self-pay | Admitting: Cardiovascular Disease

## 2022-10-09 DIAGNOSIS — L720 Epidermal cyst: Secondary | ICD-10-CM | POA: Diagnosis not present

## 2022-10-09 DIAGNOSIS — L570 Actinic keratosis: Secondary | ICD-10-CM | POA: Diagnosis not present

## 2022-10-09 DIAGNOSIS — Z85828 Personal history of other malignant neoplasm of skin: Secondary | ICD-10-CM | POA: Diagnosis not present

## 2022-11-06 ENCOUNTER — Encounter: Payer: Self-pay | Admitting: Gastroenterology

## 2022-11-14 ENCOUNTER — Ambulatory Visit (INDEPENDENT_AMBULATORY_CARE_PROVIDER_SITE_OTHER): Payer: Medicare HMO | Admitting: Family Medicine

## 2022-11-14 ENCOUNTER — Encounter: Payer: Self-pay | Admitting: Family Medicine

## 2022-11-14 VITALS — BP 150/74 | HR 84 | Temp 97.9°F | Ht 63.85 in | Wt 198.2 lb

## 2022-11-14 DIAGNOSIS — F5104 Psychophysiologic insomnia: Secondary | ICD-10-CM | POA: Diagnosis not present

## 2022-11-14 DIAGNOSIS — F411 Generalized anxiety disorder: Secondary | ICD-10-CM | POA: Insufficient documentation

## 2022-11-14 DIAGNOSIS — R0789 Other chest pain: Secondary | ICD-10-CM | POA: Diagnosis not present

## 2022-11-14 DIAGNOSIS — F321 Major depressive disorder, single episode, moderate: Secondary | ICD-10-CM | POA: Diagnosis not present

## 2022-11-14 MED ORDER — ALPRAZOLAM 0.25 MG PO TABS: 0.2500 mg | ORAL_TABLET | Freq: Every day | ORAL | 0 refills | Status: DC | PRN

## 2022-11-14 MED ORDER — TRAZODONE HCL 50 MG PO TABS: 25.0000 mg | ORAL_TABLET | Freq: Every evening | ORAL | 1 refills | Status: DC | PRN

## 2022-11-14 NOTE — Assessment & Plan Note (Signed)
Acute, worsening Will provide Xanax to use as needed on a limited basis giving recent additional stressors.  We did discuss in detail addictive nature of benzodiazepines and increased risk of use after age 72.  She understands risks and will limit use.

## 2022-11-14 NOTE — Progress Notes (Signed)
Patient ID: Christy Freeman, female    DOB: [birth year], 72 y.o.   MRN: [id number]  This visit was conducted in person.  BP (!) 150/74 (BP Location: Left Arm, Patient Position: Sitting, Cuff Size: Large)   Pulse 84   Temp 97.9 F (36.6 C) (Temporal)   Ht 5' 3.85" (1.622 m)   Wt 198 lb 4 oz (89.9 kg)   SpO2 96%   BMI 34.19 kg/m    CC:  Chief Complaint  Patient presents with   Depression   Insomnia   Anxiety   Long Covid Symptoms   Pain Under Rib Cage    Subjective:   HPI: Christy Freeman is a 72 y.o. female presenting on 11/14/2022 for Depression, Insomnia, Anxiety, Long Covid Symptoms, and Pain Under Rib Cage  MDD, GAD and chronic insomnia: Previously well-controlled on venlafaxine 150 mg daily  Mood worsened in last 2 months.  Has been worried about husbands health. She is caregiver, finances.  She is feeling overwhelmed.  Dog passed away last week.    She has started setting up with a counselor.   She has trouble staying asleep.  Waking withintrusive thoughts.  She has tried melatonin.   She used xanax in the past as needed when her Mom died... this helped her relax.   Took 2 Xanax of her sisters  prior to dogs euthanasia.and this helped.   PHQ9:  10 GAD7: 11     11/14/2022   11:27 AM 04/06/2022    9:14 AM 04/05/2021    9:52 AM  Depression screen PHQ 2/9  Decreased Interest 0 0 0  Down, Depressed, Hopeless 2 0 0  PHQ - 2 Score 2 0 0  Altered sleeping 3 2 1   Tired, decreased energy 3 0 0  Change in appetite 2 0 0  Feeling bad or failure about yourself  0 0 0  Trouble concentrating 0 0 0  Moving slowly or fidgety/restless 0 0 0  Suicidal thoughts 0 0 0  PHQ-9 Score 10 2 1   Difficult doing work/chores Somewhat difficult Not difficult at all     She feels she may be having long COVID symptoms... achy, tingly , cold. Feeling more fatigue.  No fever, not sick.  No SOB.     Chest wall soreness from crying.  Relevant past medical, surgical,  family and social history reviewed and updated as indicated. Interim medical history since our last visit reviewed. Allergies and medications reviewed and updated. Outpatient Medications Prior to Visit  Medication Sig Dispense Refill   Alpha-Lipoic Acid 200 MG CAPS Take by mouth.     amLODipine (NORVASC) 10 MG tablet TAKE 1 TABLET(10 MG) BY MOUTH DAILY 90 tablet 3   Ascorbic Acid (VITAMIN C PO) Take by mouth. 40 mg     aspirin EC 81 MG tablet Take 81 mg by mouth daily. Swallow whole.     cetirizine (ZYRTEC) 10 MG tablet Take 10 mg by mouth daily.     Cholecalciferol (VITAMIN D3 PO) Take by mouth. 1000 units 2 times daily     clobetasol (TEMOVATE) 0.05 % external solution Apply topically 2 (two) times daily.     clobetasol ointment (TEMOVATE) 0.05 %      Clobetasol Propionate 0.05 % shampoo Apply topically 3 (three) times a week.     Coenzyme Q10 (COQ10) 100 MG CAPS Take by mouth.     ezetimibe (ZETIA) 10 MG tablet TAKE 1 TABLET BY MOUTH DAILY 90 tablet 1  Folic Acid (FOLATE PO) Take by mouth. 400 mg     ketoconazole (NIZORAL) 2 % shampoo Apply topically.     lisinopril (ZESTRIL) 40 MG tablet TAKE 1 TABLET(40 MG) BY MOUTH DAILY 90 tablet 2   MAGNESIUM PO Take 425 mg by mouth 2 (two) times daily.     metoprolol succinate (TOPROL-XL) 100 MG 24 hr tablet TAKE 1 TABLET BY MOUTH EVERY DAY WITH OR IMMEDIATELY FOLLOWING A MEAL 90 tablet 3   Omega-3 Fatty Acids (OMEGA 3 PO) Take by mouth. 1280 mg     omeprazole (PRILOSEC) 10 MG capsule TAKE 1 CAPSULE(10 MG) BY MOUTH DAILY 90 capsule 3   Probiotic Product (PROBIOTIC DAILY PO) Take 1 capsule by mouth in the morning, at noon, and at bedtime.     rosuvastatin (CRESTOR) 10 MG tablet TAKE 1 TABLET(10 MG) BY MOUTH DAILY 90 tablet 3   Sodium Hyaluronate, oral, (HYALURONIC ACID PO) Take by mouth. 125 mg plus Vitamin C 12.5 x 2     thiamine (VITAMIN B-1) 100 MG tablet Take 100 mg by mouth daily.     triamcinolone cream (KENALOG) 0.1 % Apply topically 2 (two)  times daily.     venlafaxine XR (EFFEXOR-XR) 150 MG 24 hr capsule TAKE 1 CAPSULE(150 MG) BY MOUTH DAILY 90 capsule 1   vitamin B-12 (CYANOCOBALAMIN) 1000 MCG tablet Take 1,000 mcg by mouth daily.     COLLAGEN PO Take by mouth. Plus digestive Enzymes 1600 mg     Digestive Enzymes (DIGESTIVE ENZYME PO) Take by mouth. 249 mg     No facility-administered medications prior to visit.     Per HPI unless specifically indicated in ROS section below Review of Systems  Constitutional:  Negative for fatigue and fever.  HENT:  Negative for congestion.   Eyes:  Negative for pain.  Respiratory:  Negative for cough and shortness of breath.   Cardiovascular:  Negative for chest pain, palpitations and leg swelling.  Gastrointestinal:  Negative for abdominal pain.  Genitourinary:  Negative for dysuria and vaginal bleeding.  Musculoskeletal:  Negative for back pain.  Neurological:  Negative for syncope, light-headedness and headaches.  Psychiatric/Behavioral:  Positive for agitation, dysphoric mood and sleep disturbance. Negative for self-injury and suicidal ideas. The patient is nervous/anxious.    Objective:  BP (!) 150/74 (BP Location: Left Arm, Patient Position: Sitting, Cuff Size: Large)   Pulse 84   Temp 97.9 F (36.6 C) (Temporal)   Ht 5' 3.85" (1.622 m)   Wt 198 lb 4 oz (89.9 kg)   SpO2 96%   BMI 34.19 kg/m   Wt Readings from Last 3 Encounters:  11/14/22 198 lb 4 oz (89.9 kg)  05/09/22 190 lb (86.2 kg)  04/25/22 189 lb 6 oz (85.9 kg)      Physical Exam Constitutional:      General: She is not in acute distress.    Appearance: Normal appearance. She is well-developed. She is not ill-appearing or toxic-appearing.  HENT:     Head: Normocephalic.     Right Ear: Hearing, tympanic membrane, ear canal and external ear normal. Tympanic membrane is not erythematous, retracted or bulging.     Left Ear: Hearing, tympanic membrane, ear canal and external ear normal. Tympanic membrane is not  erythematous, retracted or bulging.     Nose: No mucosal edema or rhinorrhea.     Right Sinus: No maxillary sinus tenderness or frontal sinus tenderness.     Left Sinus: No maxillary sinus tenderness or frontal sinus  tenderness.     Mouth/Throat:     Pharynx: Uvula midline.  Eyes:     General: Lids are normal. Lids are everted, no foreign bodies appreciated.     Conjunctiva/sclera: Conjunctivae normal.     Pupils: Pupils are equal, round, and reactive to light.  Neck:     Thyroid: No thyroid mass or thyromegaly.     Vascular: No carotid bruit.     Trachea: Trachea normal.  Cardiovascular:     Rate and Rhythm: Normal rate and regular rhythm.     Pulses: Normal pulses.     Heart sounds: Normal heart sounds, S1 normal and S2 normal. No murmur heard.    No friction rub. No gallop.  Pulmonary:     Effort: Pulmonary effort is normal. No tachypnea or respiratory distress.     Breath sounds: Normal breath sounds. No decreased breath sounds, wheezing, rhonchi or rales.  Abdominal:     General: Bowel sounds are normal.     Palpations: Abdomen is soft.     Tenderness: There is no abdominal tenderness.  Musculoskeletal:     Cervical back: Normal range of motion and neck supple.  Skin:    General: Skin is warm and dry.     Findings: No rash.  Neurological:     Mental Status: She is alert.  Psychiatric:        Attention and Perception: Attention normal.        Mood and Affect: Mood is anxious. Mood is not depressed. Affect is tearful.        Speech: Speech normal.        Behavior: Behavior is agitated. Behavior is cooperative.        Thought Content: Thought content normal.        Judgment: Judgment normal.       Results for orders placed or performed in visit on 03/31/22  VITAMIN D 25 Hydroxy (Vit-D Deficiency, Fractures)  Result Value Ref Range   VITD 34.64 30.00 - 100.00 ng/mL  Lipid panel  Result Value Ref Range   Cholesterol 170 0 - 200 mg/dL   Triglycerides 865.7 0.0 - 149.0  mg/dL   HDL 84.69 >62.95 mg/dL   VLDL 28.4 0.0 - 13.2 mg/dL   LDL Cholesterol 69 0 - 99 mg/dL   Total CHOL/HDL Ratio 2    NonHDL 89.91   Comprehensive metabolic panel  Result Value Ref Range   Sodium 135 135 - 145 mEq/L   Potassium 3.9 3.5 - 5.1 mEq/L   Chloride 98 96 - 112 mEq/L   CO2 28 19 - 32 mEq/L   Glucose, Bld 99 70 - 99 mg/dL   BUN 8 6 - 23 mg/dL   Creatinine, Ser 4.40 0.40 - 1.20 mg/dL   Total Bilirubin 0.6 0.2 - 1.2 mg/dL   Alkaline Phosphatase 116 39 - 117 U/L   AST 32 0 - 37 U/L   ALT 40 (H) 0 - 35 U/L   Total Protein 8.2 6.0 - 8.3 g/dL   Albumin 4.6 3.5 - 5.2 g/dL   GFR 10.27 >25.36 mL/min   Calcium 9.5 8.4 - 10.5 mg/dL    Assessment and Plan  MDD (major depressive disorder), single episode, moderate (HCC) Assessment & Plan: Chronic, recent worsening with additional stressors. Recommend continued venlafaxine XL 150 mg p.o. daily.  Will place referral to counselor for additional treatment. Start with treatment of anxiety and insomnia.  She will follow-up in 4 weeks for reevaluation of mood.  Orders: -  Ambulatory referral to Psychology  GAD (generalized anxiety disorder) Assessment & Plan: Acute, worsening Will provide Xanax to use as needed on a limited basis giving recent additional stressors.  We did discuss in detail addictive nature of benzodiazepines and increased risk of use after age 21.  She understands risks and will limit use.   Acute chest wall pain Assessment & Plan: Acute, recommend chest wall stretching.  No underlying abnormality seen.   Chronic insomnia Assessment & Plan: Chronic, poor control Will start trial of trazodone 25 to 50 mg p.o. nightly as needed for sleep.  Reviewed healthy sleep hygiene recommendations.   Other orders -     ALPRAZolam; Take 1 tablet (0.25 mg total) by mouth daily as needed for anxiety.  Dispense: 30 tablet; Refill: 0 -     traZODone HCl; Take 0.5-1 tablets (25-50 mg total) by mouth at bedtime as  needed for sleep.  Dispense: 30 tablet; Refill: 1    Return in about 4 weeks (around 12/12/2022) for follow up mood.   Kerby Nora, MD

## 2022-11-14 NOTE — Assessment & Plan Note (Signed)
Acute, recommend chest wall stretching.  No underlying abnormality seen.

## 2022-11-14 NOTE — Assessment & Plan Note (Signed)
Chronic, poor control Will start trial of trazodone 25 to 50 mg p.o. nightly as needed for sleep.  Reviewed healthy sleep hygiene recommendations.

## 2022-11-14 NOTE — Patient Instructions (Addendum)
Call to set up psychologist appointment.  Number on pamphlet. Start trazodone at night for sleep. Can use alprazolam on a limited basis as needed for significant anxiety. Continue venlafaxine XL 150 mg daily Start chest wall stretching.

## 2022-11-14 NOTE — Assessment & Plan Note (Signed)
Chronic, recent worsening with additional stressors. Recommend continued venlafaxine XL 150 mg p.o. daily.  Will place referral to counselor for additional treatment. Start with treatment of anxiety and insomnia.  She will follow-up in 4 weeks for reevaluation of mood.

## 2022-12-04 ENCOUNTER — Encounter: Payer: Self-pay | Admitting: Gastroenterology

## 2022-12-05 DIAGNOSIS — L03114 Cellulitis of left upper limb: Secondary | ICD-10-CM | POA: Diagnosis not present

## 2022-12-05 DIAGNOSIS — T22032A Burn of unspecified degree of left upper arm, initial encounter: Secondary | ICD-10-CM | POA: Diagnosis not present

## 2022-12-08 ENCOUNTER — Ambulatory Visit (INDEPENDENT_AMBULATORY_CARE_PROVIDER_SITE_OTHER): Payer: Medicare HMO | Admitting: Family Medicine

## 2022-12-08 ENCOUNTER — Encounter: Payer: Self-pay | Admitting: Family Medicine

## 2022-12-08 VITALS — BP 150/70 | HR 81 | Temp 98.4°F | Ht 63.85 in | Wt 197.4 lb

## 2022-12-08 DIAGNOSIS — L03114 Cellulitis of left upper limb: Secondary | ICD-10-CM | POA: Diagnosis not present

## 2022-12-08 DIAGNOSIS — T22132D Burn of first degree of left upper arm, subsequent encounter: Secondary | ICD-10-CM

## 2022-12-08 DIAGNOSIS — T22132A Burn of first degree of left upper arm, initial encounter: Secondary | ICD-10-CM | POA: Insufficient documentation

## 2022-12-08 MED ORDER — CEPHALEXIN 500 MG PO CAPS: 500.0000 mg | ORAL_CAPSULE | Freq: Three times a day (TID) | ORAL | 0 refills | Status: DC

## 2022-12-08 NOTE — Addendum Note (Signed)
Addended by: Damita Lack on: 12/08/2022 10:54 AM   Modules accepted: Orders

## 2022-12-08 NOTE — Patient Instructions (Signed)
Complete remainder of antibiotics with Keflex.  Stop clindamycin given could be causing dizziness.  Push fluids. Call redness spreading, fever or not tolerating antibiotics.

## 2022-12-08 NOTE — Progress Notes (Signed)
Patient ID: Christy Freeman, female    DOB: 12-Aug-1950, 72 y.o.   MRN: [id number]  This visit was conducted in person.  BP (!) 150/70 (BP Location: Right Arm, Patient Position: Sitting, Cuff Size: Normal)   Pulse 81   Temp 98.4 F (36.9 C) (Temporal)   Ht 5' 3.85" (1.622 m)   Wt 197 lb 6 oz (89.5 kg)   SpO2 96%   BMI 34.04 kg/m    CC:  Chief Complaint  Patient presents with   Burn    Left Arm   Dizziness    Subjective:   HPI: Christy Freeman is a 72 y.o. female presenting on 12/08/2022 for Burn (Left Arm) and Dizziness  She was seen in urgent care on July 2 for a burn on her arm from an oven rack that had occurred 1 week prior 11/30/22 She had felt it was not healing as usual, noted increase in redness. No fever chills or flulike symptoms. Reviewed office visit note. Diagnosed with cellulitis, treated with clindamycin 300 mg 3 times daily for 5 days.  Also recommended Bactroban ointment.  Today: She is on day 3 of antibiotics.    She has noted significant  improvement in redness, heat and swelling.  Still some irritation from bandage.  She has started having dizziness since starting antibiotic, felt off balance.  Drinking lots of water.  No fever.  No N, V, D.   BP at home 140/70 at home when she felt dizzy  She looked back in records  and she has tolerated doxy in past. Vomiting at the time she took her first dose doxycycline was likely associated with the infection at the time.  Relevant past medical, surgical, family and social history reviewed and updated as indicated. Interim medical history since our last visit reviewed. Allergies and medications reviewed and updated. Outpatient Medications Prior to Visit  Medication Sig Dispense Refill   Alpha-Lipoic Acid 200 MG CAPS Take by mouth.     ALPRAZolam (XANAX) 0.25 MG tablet Take 1 tablet (0.25 mg total) by mouth daily as needed for anxiety. 30 tablet 0   amLODipine (NORVASC) 10 MG tablet TAKE 1 TABLET(10 MG)  BY MOUTH DAILY 90 tablet 3   Ascorbic Acid (VITAMIN C PO) Take by mouth. 40 mg     aspirin EC 81 MG tablet Take 81 mg by mouth daily. Swallow whole.     cetirizine (ZYRTEC) 10 MG tablet Take 10 mg by mouth daily.     Cholecalciferol (VITAMIN D3 PO) Take by mouth. 1000 units 2 times daily     clindamycin (CLEOCIN) 300 MG capsule Take 300 mg by mouth 3 (three) times daily.     clobetasol (TEMOVATE) 0.05 % external solution Apply topically 2 (two) times daily.     clobetasol ointment (TEMOVATE) 0.05 %      Clobetasol Propionate 0.05 % shampoo Apply topically 3 (three) times a week.     Coenzyme Q10 (COQ10) 100 MG CAPS Take by mouth.     ezetimibe (ZETIA) 10 MG tablet TAKE 1 TABLET BY MOUTH DAILY 90 tablet 1   Folic Acid (FOLATE PO) Take by mouth. 400 mg     ketoconazole (NIZORAL) 2 % shampoo Apply topically.     lisinopril (ZESTRIL) 40 MG tablet TAKE 1 TABLET(40 MG) BY MOUTH DAILY 90 tablet 2   MAGNESIUM PO Take 425 mg by mouth 2 (two) times daily.     metoprolol succinate (TOPROL-XL) 100 MG 24 hr tablet TAKE  1 TABLET BY MOUTH EVERY DAY WITH OR IMMEDIATELY FOLLOWING A MEAL 90 tablet 3   mupirocin ointment (BACTROBAN) 2 % Apply 1 Application topically 2 (two) times daily.     Omega-3 Fatty Acids (OMEGA 3 PO) Take by mouth. 1280 mg     omeprazole (PRILOSEC) 10 MG capsule TAKE 1 CAPSULE(10 MG) BY MOUTH DAILY 90 capsule 3   Probiotic Product (PROBIOTIC DAILY PO) Take 1 capsule by mouth in the morning, at noon, and at bedtime.     rosuvastatin (CRESTOR) 10 MG tablet TAKE 1 TABLET(10 MG) BY MOUTH DAILY 90 tablet 3   Sodium Hyaluronate, oral, (HYALURONIC ACID PO) Take by mouth. 125 mg plus Vitamin C 12.5 x 2     thiamine (VITAMIN B-1) 100 MG tablet Take 100 mg by mouth daily.     triamcinolone cream (KENALOG) 0.1 % Apply topically 2 (two) times daily.     venlafaxine XR (EFFEXOR-XR) 150 MG 24 hr capsule TAKE 1 CAPSULE(150 MG) BY MOUTH DAILY 90 capsule 1   vitamin B-12 (CYANOCOBALAMIN) 1000 MCG tablet  Take 1,000 mcg by mouth daily.     traZODone (DESYREL) 50 MG tablet Take 0.5-1 tablets (25-50 mg total) by mouth at bedtime as needed for sleep. (Patient not taking: Reported on 12/08/2022) 30 tablet 1   No facility-administered medications prior to visit.     Per HPI unless specifically indicated in ROS section below Review of Systems  Constitutional:  Negative for fatigue and fever.  HENT:  Negative for congestion.   Eyes:  Negative for pain.  Respiratory:  Negative for cough and shortness of breath.   Cardiovascular:  Negative for chest pain, palpitations and leg swelling.  Gastrointestinal:  Negative for abdominal pain.  Genitourinary:  Negative for dysuria and vaginal bleeding.  Musculoskeletal:  Negative for back pain.  Neurological:  Negative for syncope, light-headedness and headaches.  Psychiatric/Behavioral:  Negative for dysphoric mood.    Objective:  BP (!) 150/70 (BP Location: Right Arm, Patient Position: Sitting, Cuff Size: Normal)   Pulse 81   Temp 98.4 F (36.9 C) (Temporal)   Ht 5' 3.85" (1.622 m)   Wt 197 lb 6 oz (89.5 kg)   SpO2 96%   BMI 34.04 kg/m   Wt Readings from Last 3 Encounters:  12/08/22 197 lb 6 oz (89.5 kg)  11/14/22 198 lb 4 oz (89.9 kg)  05/09/22 190 lb (86.2 kg)      Physical Exam Constitutional:      General: She is not in acute distress.    Appearance: Normal appearance. She is well-developed. She is not ill-appearing or toxic-appearing.  HENT:     Head: Normocephalic.     Right Ear: Hearing, tympanic membrane, ear canal and external ear normal. Tympanic membrane is not erythematous, retracted or bulging.     Left Ear: Hearing, tympanic membrane, ear canal and external ear normal. Tympanic membrane is not erythematous, retracted or bulging.     Nose: No mucosal edema or rhinorrhea.     Right Sinus: No maxillary sinus tenderness or frontal sinus tenderness.     Left Sinus: No maxillary sinus tenderness or frontal sinus tenderness.      Mouth/Throat:     Pharynx: Uvula midline.  Eyes:     General: Lids are normal. Lids are everted, no foreign bodies appreciated.     Conjunctiva/sclera: Conjunctivae normal.     Pupils: Pupils are equal, round, and reactive to light.  Neck:     Thyroid: No thyroid mass  or thyromegaly.     Vascular: No carotid bruit.     Trachea: Trachea normal.  Cardiovascular:     Rate and Rhythm: Normal rate and regular rhythm.     Pulses: Normal pulses.     Heart sounds: Normal heart sounds, S1 normal and S2 normal. No murmur heard.    No friction rub. No gallop.  Pulmonary:     Effort: Pulmonary effort is normal. No tachypnea or respiratory distress.     Breath sounds: Normal breath sounds. No decreased breath sounds, wheezing, rhonchi or rales.  Abdominal:     General: Bowel sounds are normal.     Palpations: Abdomen is soft.     Tenderness: There is no abdominal tenderness.  Musculoskeletal:     Cervical back: Normal range of motion and neck supple.  Skin:    General: Skin is warm and dry.     Findings: Signs of injury present.     Comments: See photo  Neurological:     Mental Status: She is alert.  Psychiatric:        Mood and Affect: Mood is not anxious or depressed.        Speech: Speech normal.        Behavior: Behavior normal. Behavior is cooperative.        Thought Content: Thought content normal.        Judgment: Judgment normal.       Results for orders placed or performed in visit on 03/31/22  VITAMIN D 25 Hydroxy (Vit-D Deficiency, Fractures)  Result Value Ref Range   VITD 34.64 30.00 - 100.00 ng/mL  Lipid panel  Result Value Ref Range   Cholesterol 170 0 - 200 mg/dL   Triglycerides 161.0 0.0 - 149.0 mg/dL   HDL 96.04 >54.09 mg/dL   VLDL 81.1 0.0 - 91.4 mg/dL   LDL Cholesterol 69 0 - 99 mg/dL   Total CHOL/HDL Ratio 2    NonHDL 89.91   Comprehensive metabolic panel  Result Value Ref Range   Sodium 135 135 - 145 mEq/L   Potassium 3.9 3.5 - 5.1 mEq/L   Chloride 98  96 - 112 mEq/L   CO2 28 19 - 32 mEq/L   Glucose, Bld 99 70 - 99 mg/dL   BUN 8 6 - 23 mg/dL   Creatinine, Ser 7.82 0.40 - 1.20 mg/dL   Total Bilirubin 0.6 0.2 - 1.2 mg/dL   Alkaline Phosphatase 116 39 - 117 U/L   AST 32 0 - 37 U/L   ALT 40 (H) 0 - 35 U/L   Total Protein 8.2 6.0 - 8.3 g/dL   Albumin 4.6 3.5 - 5.2 g/dL   GFR 95.62 >13.08 mL/min   Calcium 9.5 8.4 - 10.5 mg/dL    Assessment and Plan  Superficial burn of left upper arm, subsequent encounter Assessment & Plan: Acute, now healing well.   Cellulitis of arm, left Assessment & Plan: Acute, significant response to clindamycin.  She may be having side effect of dizziness.  Positive dizziness, well-hydrated. Will change antibiotic to Keflex 3 times daily for the remainder of the course, 4 days. She does have associated irritation from coban bandage... Diminished to large Band-Aid.  Elevate.  Can use topical cortisone 10 cream on irritated areas other than healing wound.  Return and ER precautions provided   Other orders -     Cephalexin; Take 1 capsule (500 mg total) by mouth 3 (three) times daily.  Dispense: 12 capsule; Refill: 0  No follow-ups on file.   Kerby Nora, MD

## 2022-12-08 NOTE — Assessment & Plan Note (Addendum)
Acute, significant response to clindamycin.  She may be having side effect of dizziness.  Positive dizziness, well-hydrated. Will change antibiotic to Keflex 3 times daily for the remainder of the course, 4 days. She does have associated irritation from coban bandage... Diminished to large Band-Aid.  Elevate.  Can use topical cortisone 10 cream on irritated areas other than healing wound.  Return and ER precautions provided

## 2022-12-08 NOTE — Assessment & Plan Note (Signed)
Acute, now healing well.

## 2022-12-10 ENCOUNTER — Encounter: Payer: Self-pay | Admitting: Family Medicine

## 2022-12-19 DIAGNOSIS — R69 Illness, unspecified: Secondary | ICD-10-CM | POA: Diagnosis not present

## 2022-12-25 ENCOUNTER — Ambulatory Visit: Payer: Medicare HMO | Admitting: Physician Assistant

## 2022-12-25 NOTE — Progress Notes (Deleted)
Cardiology Office Note    Date:  12/25/2022   ID:  Christy Freeman, Christy Freeman 05-23-1951, MRN [id number]  PCP:  Excell Seltzer, MD  Cardiologist:  Julien Nordmann, MD  Electrophysiologist:  None   Chief Complaint: ***  History of Present Illness:   Christy Freeman is a 72 y.o. female with pertinent history of coronary calcification noted on CT imaging in 2019, aortic atherosclerosis, fatty liver disease, and HLD who presents for ***  Remote stress test from 2014 showed no evidence of ischemia.  Echo from 2014 showed an EF of 60 to 65%, normal wall motion, trivial aortic insufficiency, normal RV systolic function and RVSP.  Coronary calcium score of 0 in 09/2013.  CT of the chest in 2019 showed mild coronary artery calcification.   She was last seen by Dr. Mariah Milling in 04/2022 and was without symptoms of angina or cardiac decompensation.  ***   Labs independently reviewed: 03/2022 - potassium 3.9, BUN 8, serum creatinine 0.47, albumin 4.6, AST normal, ALT 40, TC 170, TG 103, HDL 80, LDL 69 04/2021 - Hgb 14.1, PLT 269, TSH normal  Past Medical History:  Diagnosis Date   Allergy    seasonal allergies   Anxiety    hx- on meds   Arthritis    Right hand   Blood transfusion without reported diagnosis 1989   Cancer (HCC)    basal cell on face   Cataract    Colon polyps    GERD (gastroesophageal reflux disease)    H/O: depression    Hypercholesterolemia    diet controlled   Hypertension    Lung nodule    left lung    Psoriasis     Past Surgical History:  Procedure Laterality Date   APPENDECTOMY  06/14/2017   BREAST BIOPSY Right 2000   benign   COLONOSCOPY  2016   TA x 1   CYST REMOVAL HAND Left    DILATION AND CURETTAGE OF UTERUS     LAPAROSCOPIC APPENDECTOMY N/A 06/14/2017   Procedure: APPENDECTOMY LAPAROSCOPIC;  Surgeon: Ricarda Frame, MD;  Location: [facility] ORS;  Service: General;  Laterality: N/A;   POLYPECTOMY  2016   TA    Current Medications: No outpatient  medications have been marked as taking for the 12/25/22 encounter (Appointment) with Sondra Barges, PA-C.    Allergies:   Patient has no active allergies.   Social History   Socioeconomic History   Marital status: Married    Spouse name: Sherrine Maples   Number of children: 0   Years of education: Not on file   Highest education level: Not on file  Occupational History    Comment:  chemist  Tobacco Use   Smoking status: Never    Passive exposure: Never   Smokeless tobacco: Never  Vaping Use   Vaping status: Never Used  Substance and Sexual Activity   Alcohol use: Yes    Alcohol/week: 14.0 standard drinks of alcohol    Types: 6 Glasses of wine, 8 Standard drinks or equivalent per week    Comment: 2 glasses of wine per night   Drug use: No   Sexual activity: Yes  Other Topics Concern   Not on file  Social History Narrative   She would desire CPR.   Does have a living will.   Social Determinants of Health   Financial Resource Strain: Low Risk  (04/06/2022)   Overall Financial Resource Strain (CARDIA)    Difficulty of Paying Living Expenses: Not hard  at all  Food Insecurity: No Food Insecurity (04/06/2022)   Hunger Vital Sign    Worried About Running Out of Food in the Last Year: Never true    Ran Out of Food in the Last Year: Never true  Transportation Needs: No Transportation Needs (04/06/2022)   PRAPARE - Administrator, Civil Service (Medical): No    Lack of Transportation (Non-Medical): No  Physical Activity: Sufficiently Active (04/06/2022)   Exercise Vital Sign    Days of Exercise per Week: 4 days    Minutes of Exercise per Session: 50 min  Stress: No Stress Concern Present (04/06/2022)   [employer] of Occupational Health - Occupational Stress Questionnaire    Feeling of Stress : Not at all  Social Connections: Moderately Integrated (04/06/2022)   Social Connection and Isolation Panel [NHANES]    Frequency of Communication with Friends and Family: More than  three times a week    Frequency of Social Gatherings with Friends and Family: Twice a week    Attends Religious Services: Never    Database administrator or Organizations: Yes    Attends Engineer, structural: More than 4 times per year    Marital Status: Married     Family History:  The patient's family history includes Breast cancer (age of onset: 68) in an other family member; Diabetes in her mother; Heart disease in her brother; Heart failure in her mother; Hypertension in her father and mother; Stroke in her father and maternal grandmother. There is no history of Colon cancer, Colon polyps, Esophageal cancer, Stomach cancer, or Rectal cancer.  ROS:   12-point review of systems is negative unless otherwise noted in the HPI.   EKGs/Labs/Other Studies Reviewed:    Studies reviewed were summarized above. The additional studies were reviewed today:  Calcium score 09/11/2013: IMPRESSION: Coronary calcium score of 0. This was 0 percentile for age and sex matched control. __________  2D echo 04/04/2013: - Left ventricle: The cavity size was normal. Systolic    function was normal. The estimated ejection fraction was    in the range of 60% to 65%. Wall motion was normal; there    were no regional wall motion abnormalities.  - Aortic valve: Trivial regurgitation.  - Left atrium: The atrium was normal in size.  - Right ventricle: Systolic function was normal.  - Pulmonary arteries: Systolic pressure was within the    normal range.  Impressions:   - Normal study.   EKG:  EKG is ordered today.  The EKG ordered today demonstrates ***  Recent Labs: 03/31/2022: ALT 40; BUN 8; Creatinine, Ser 0.47; Potassium 3.9; Sodium 135  Recent Lipid Panel    Component Value Date/Time   CHOL 170 03/31/2022 0729   CHOL 258 (H) 01/28/2015 0852   TRIG 103.0 03/31/2022 0729   HDL 80.30 03/31/2022 0729   HDL 68 01/28/2015 0852   CHOLHDL 2 03/31/2022 0729   VLDL 20.6 03/31/2022 0729    LDLCALC 69 03/31/2022 0729   LDLCALC 154 (H) 01/28/2015 0852   LDLDIRECT 191.0 07/30/2014 1401    PHYSICAL EXAM:    VS:  There were no vitals taken for this visit.  BMI: There is no height or weight on file to calculate BMI.  Physical Exam  Wt Readings from Last 3 Encounters:  12/08/22 197 lb 6 oz (89.5 kg)  11/14/22 198 lb 4 oz (89.9 kg)  05/09/22 190 lb (86.2 kg)     ASSESSMENT & PLAN:  Coronary artery calcification/aortic atherosclerosis/HLD: LDL 69 in 03/2022.  PACs:  HTN: Blood pressure   {Are you ordering a CV Procedure (e.g. stress test, cath, DCCV, TEE, etc)?   Press F2        :[id number]}     Disposition: F/u with Dr. Mariah Milling or an APP in ***.   Medication Adjustments/Labs and Tests Ordered: Current medicines are reviewed at length with the patient today.  Concerns regarding medicines are outlined above. Medication changes, Labs and Tests ordered today are summarized above and listed in the Patient Instructions accessible in Encounters.   Signed, Eula Listen, PA-C 12/25/2022 7:10 AM      [facility] [address] [place], [place] [address] [phone]

## 2022-12-27 ENCOUNTER — Ambulatory Visit: Payer: Medicare HMO | Admitting: Family Medicine

## 2023-01-03 ENCOUNTER — Ambulatory Visit: Payer: Medicare HMO | Admitting: Family Medicine

## 2023-01-08 ENCOUNTER — Ambulatory Visit (AMBULATORY_SURGERY_CENTER): Payer: Medicare HMO

## 2023-01-08 VITALS — Ht 63.85 in | Wt 193.0 lb

## 2023-01-08 DIAGNOSIS — Z86010 Personal history of colonic polyps: Secondary | ICD-10-CM

## 2023-01-08 MED ORDER — SUTAB 1479-225-188 MG PO TABS
12.0000 | ORAL_TABLET | ORAL | 0 refills | Status: DC
Start: 2023-01-08 — End: 2023-02-07

## 2023-01-08 NOTE — Progress Notes (Signed)
No egg or soy allergy known to patient  No issues known to pt with past sedation with any surgeries or procedures: no issues patient does report low tolerance to sedation. Took her a while to wake up after her last colonoscopy  Patient denies ever being told they had issues or difficulty with intubation  No FH of Malignant Hyperthermia Pt is not on diet pills Pt is not on  home 02  Pt is not on blood thinners  Pt denies issues with constipation  No A fib or A flutter Have any cardiac testing pending-- no test annual apt in oct LOA: independent  Prep: Sutab   Patient's chart reviewed by Cathlyn Parsons CNRA prior to previsit and patient appropriate for the [facility].  Previsit completed and red dot placed by patient's name on their procedure day (on provider's schedule).     PV competed with patient. Prep instructions sent via mychart and home address. Sutab coupon provided to use for price reduction if needed.

## 2023-01-10 ENCOUNTER — Encounter: Payer: Self-pay | Admitting: Family Medicine

## 2023-01-10 ENCOUNTER — Ambulatory Visit: Payer: Medicare HMO | Admitting: Family Medicine

## 2023-01-10 ENCOUNTER — Telehealth: Payer: Self-pay | Admitting: Cardiovascular Disease

## 2023-01-10 VITALS — BP 144/80 | HR 89 | Temp 97.9°F | Ht 63.85 in | Wt 195.0 lb

## 2023-01-10 DIAGNOSIS — I10 Essential (primary) hypertension: Secondary | ICD-10-CM

## 2023-01-10 LAB — CBC WITH DIFFERENTIAL/PLATELET
Basophils Absolute: 0.1 10*3/uL (ref 0.0–0.1)
Basophils Relative: 0.9 % (ref 0.0–3.0)
Eosinophils Absolute: 0.1 10*3/uL (ref 0.0–0.7)
Eosinophils Relative: 1.1 % (ref 0.0–5.0)
HCT: 42.4 % (ref 36.0–46.0)
Hemoglobin: 13.8 g/dL (ref 12.0–15.0)
Lymphocytes Relative: 22.8 % (ref 12.0–46.0)
Lymphs Abs: 2.4 10*3/uL (ref 0.7–4.0)
MCHC: 32.5 g/dL (ref 30.0–36.0)
MCV: 93.7 fl (ref 78.0–100.0)
Monocytes Absolute: 0.8 10*3/uL (ref 0.1–1.0)
Monocytes Relative: 7.2 % (ref 3.0–12.0)
Neutro Abs: 7.2 10*3/uL (ref 1.4–7.7)
Neutrophils Relative %: 68 % (ref 43.0–77.0)
Platelets: 325 10*3/uL (ref 150.0–400.0)
RBC: 4.53 Mil/uL (ref 3.87–5.11)
RDW: 13.9 % (ref 11.5–15.5)
WBC: 10.6 10*3/uL — ABNORMAL HIGH (ref 4.0–10.5)

## 2023-01-10 LAB — COMPREHENSIVE METABOLIC PANEL
ALT: 42 U/L — ABNORMAL HIGH (ref 0–35)
AST: 45 U/L — ABNORMAL HIGH (ref 0–37)
Albumin: 4.7 g/dL (ref 3.5–5.2)
Alkaline Phosphatase: 125 U/L — ABNORMAL HIGH (ref 39–117)
BUN: 15 mg/dL (ref 6–23)
CO2: 29 mEq/L (ref 19–32)
Calcium: 9.8 mg/dL (ref 8.4–10.5)
Chloride: 97 mEq/L (ref 96–112)
Creatinine, Ser: 0.57 mg/dL (ref 0.40–1.20)
GFR: 90.73 mL/min (ref >60.00)
Glucose, Bld: 103 mg/dL — ABNORMAL HIGH (ref 70–99)
Potassium: 4.2 mEq/L (ref 3.5–5.1)
Sodium: 134 mEq/L — ABNORMAL LOW (ref 135–145)
Total Bilirubin: 0.4 mg/dL (ref 0.2–1.2)
Total Protein: 8 g/dL (ref 6.0–8.3)

## 2023-01-10 LAB — TSH: TSH: 2.68 u[IU]/mL (ref 0.35–5.50)

## 2023-01-10 NOTE — Assessment & Plan Note (Addendum)
Chronic with acute worsening Will evaluate with labs including thyroid, liver, kidney and CBC for possible secondary cause. Patient already avoiding caffeine and decongestants. There is some discrepancy between her home cuff and our cuff here in the office but her blood pressure for the last 3-5 measurements in office have been elevated.  She has been under an extreme amount of caregiving stress with her husband possible dementia diagnosis.  She is already on max dose lisinopril 40 mg daily, max amlodipine 10 mg daily as well as Toprol XL 100 mg daily. We discussed potentially adding hydrochlorothiazide to her medication regimen to lower blood pressure further.  She is hesitant given her history of hyponatremia.  I do think this is still an option given it has been normal the last 3 years.   I will forward my note to Dr. Mariah Milling cardiology for additional recommendations per patient request.

## 2023-01-10 NOTE — Telephone Encounter (Signed)
Pt c/o BP issue: STAT if pt c/o blurred vision, one-sided weakness or slurred speech  1. What are your last 5 BP readings? 150/89 3 hours after meds  Didn't have readings at time of call was in the car on the way to PCP  2. Are you having any other symptoms (ex. Dizziness, headache, blurred vision, passed out)? Dizziness, but states this may be due to a antibiotic she has taken for cellulitis   3. What is your BP issue? Hypertension for the past few months  Patient is requesting a callback after 10 due to currently going to PCP for an appt regarding this. Please advise.

## 2023-01-10 NOTE — Progress Notes (Signed)
Patient ID: Christy Freeman, female    DOB: [birth year], 72 y.o.   MRN: [id number]  This visit was conducted in person.  BP (!) 144/80 (BP Location: Left Arm, Patient Position: Sitting, Cuff Size: Normal)   Pulse 89   Temp 97.9 F (36.6 C) (Temporal)   Ht 5' 3.85" (1.622 m)   Wt 195 lb (88.5 kg)   SpO2 94%   BMI 33.63 kg/m    CC:  Chief Complaint  Patient presents with   Hypertension    Subjective:   HPI: Christy Freeman is a 72 y.o. female with history of hypertension, generalized anxiety presenting on 01/10/2023 for Hypertension  Cellulitis on arm in last month... treated with 2 courses of antibiotics... now resolved. Since being on these she has noted dizziness and BP elevations... 140-160/78-82.  Notes dizziness is mainly with  sitting to standing.  156/84 per home cuff and here was 144/80... cuff about 72 year old.  She take plain cetrizine.. no decongestant.   Hypertension:   She has noted recently elevated blood pressures in the last few weeks. On amlodipine 10 mg p.o. daily, lisinopril 40 mg p.o. daily metoprolol XL 100 mg p.o. daily BP Readings from Last 3 Encounters:  01/10/23 (!) 144/80  12/08/22 (!) 150/70  11/14/22 (!) 150/74  Using medication without problems or lightheadedness:  yes Chest pain with exertion: none Edema: none Short of breath: none Average home BPs: Other issues: We discussed possibly adding hydrochlorothiazide to her ACE inhibitor.  She has had normal sodium for the last 3 years but did previously have an issue with hyponatremia.   Reviewed last office visit from Dr. Mariah Milling cardiology April 25, 2022 At that time aortic atherosclerosis noted, asymptomatic PACs and well-controlled hypertension.  Only occassionally had to use 1/2 tab of short acting metoprolol.  Cutting back on caffeine has helped.  Relevant past medical, surgical, family and social history reviewed and updated as indicated. Interim medical history since our last  visit reviewed. Allergies and medications reviewed and updated. Outpatient Medications Prior to Visit  Medication Sig Dispense Refill   Alpha-Lipoic Acid 200 MG CAPS Take by mouth.     ALPRAZolam (XANAX) 0.25 MG tablet Take 1 tablet (0.25 mg total) by mouth daily as needed for anxiety. 30 tablet 0   amLODipine (NORVASC) 10 MG tablet TAKE 1 TABLET(10 MG) BY MOUTH DAILY 90 tablet 3   Ascorbic Acid (VITAMIN C PO) Take by mouth. 40 mg     aspirin EC 81 MG tablet Take 81 mg by mouth daily. Swallow whole.     cetirizine (ZYRTEC) 10 MG tablet Take 10 mg by mouth daily.     Cholecalciferol (VITAMIN D3 PO) Take by mouth. 1000 units 2 times daily     clobetasol (TEMOVATE) 0.05 % external solution Apply 1 Application topically 2 (two) times daily as needed.     clobetasol ointment (TEMOVATE) 0.05 % Apply 1 Application topically 2 (two) times daily as needed.     Clobetasol Propionate 0.05 % shampoo Use three times weekly as needed     Coenzyme Q10 (COQ10) 100 MG CAPS Take by mouth.     ezetimibe (ZETIA) 10 MG tablet TAKE 1 TABLET BY MOUTH DAILY 90 tablet 1   Folic Acid (FOLATE PO) Take by mouth. 400 mg     lisinopril (ZESTRIL) 40 MG tablet TAKE 1 TABLET(40 MG) BY MOUTH DAILY 90 tablet 2   MAGNESIUM PO Take 425 mg by mouth 2 (two) times daily.  metoprolol succinate (TOPROL-XL) 100 MG 24 hr tablet TAKE 1 TABLET BY MOUTH EVERY DAY WITH OR IMMEDIATELY FOLLOWING A MEAL 90 tablet 3   Omega-3 Fatty Acids (OMEGA 3 PO) Take by mouth. 1280 mg     omeprazole (PRILOSEC) 10 MG capsule TAKE 1 CAPSULE(10 MG) BY MOUTH DAILY 90 capsule 3   Probiotic Product (PROBIOTIC DAILY PO) Take 1 capsule by mouth in the morning, at noon, and at bedtime.     rosuvastatin (CRESTOR) 10 MG tablet TAKE 1 TABLET(10 MG) BY MOUTH DAILY 90 tablet 3   Sodium Hyaluronate, oral, (HYALURONIC ACID PO) Take by mouth. 125 mg plus Vitamin C 12.5 x 2     Sodium Sulfate-Mag Sulfate-KCl (SUTAB) [phone] MG TABS Take 12 tablets by mouth as  directed. Pt is aware insurance will not cover her prep. Please use sutab coupon provided by patient to discount. 24 tablet 0   thiamine (VITAMIN B-1) 100 MG tablet Take 100 mg by mouth daily.     traZODone (DESYREL) 50 MG tablet Take 0.5-1 tablets (25-50 mg total) by mouth at bedtime as needed for sleep. 30 tablet 1   triamcinolone cream (KENALOG) 0.1 % Apply 1 Application topically 2 (two) times daily as needed.     venlafaxine XR (EFFEXOR-XR) 150 MG 24 hr capsule TAKE 1 CAPSULE(150 MG) BY MOUTH DAILY 90 capsule 1   vitamin B-12 (CYANOCOBALAMIN) 1000 MCG tablet Take 1,000 mcg by mouth daily.     cephALEXin (KEFLEX) 500 MG capsule Take 1 capsule (500 mg total) by mouth 3 (three) times daily. (Patient not taking: Reported on 01/10/2023) 12 capsule 0   No facility-administered medications prior to visit.     Per HPI unless specifically indicated in ROS section below Review of Systems  Constitutional:  Negative for fatigue and fever.  HENT:  Negative for congestion.   Eyes:  Negative for pain.  Respiratory:  Negative for cough and shortness of breath.   Cardiovascular:  Negative for chest pain, palpitations and leg swelling.  Gastrointestinal:  Negative for abdominal pain.  Genitourinary:  Negative for dysuria and vaginal bleeding.  Musculoskeletal:  Negative for back pain.  Neurological:  Negative for syncope, light-headedness and headaches.  Psychiatric/Behavioral:  Positive for dysphoric mood. The patient is nervous/anxious.    Objective:  BP (!) 144/80 (BP Location: Left Arm, Patient Position: Sitting, Cuff Size: Normal)   Pulse 89   Temp 97.9 F (36.6 C) (Temporal)   Ht 5' 3.85" (1.622 m)   Wt 195 lb (88.5 kg)   SpO2 94%   BMI 33.63 kg/m   Wt Readings from Last 3 Encounters:  01/10/23 195 lb (88.5 kg)  01/08/23 193 lb (87.5 kg)  12/08/22 197 lb 6 oz (89.5 kg)      Physical Exam Constitutional:      General: She is not in acute distress.    Appearance: Normal appearance.  She is well-developed. She is not ill-appearing or toxic-appearing.  HENT:     Head: Normocephalic.     Right Ear: Hearing, tympanic membrane, ear canal and external ear normal. Tympanic membrane is not erythematous, retracted or bulging.     Left Ear: Hearing, tympanic membrane, ear canal and external ear normal. Tympanic membrane is not erythematous, retracted or bulging.     Nose: No mucosal edema or rhinorrhea.     Right Sinus: No maxillary sinus tenderness or frontal sinus tenderness.     Left Sinus: No maxillary sinus tenderness or frontal sinus tenderness.  Mouth/Throat:     Mouth: Oropharynx is clear and moist and mucous membranes are normal.     Pharynx: Uvula midline.  Eyes:     General: Lids are normal. Lids are everted, no foreign bodies appreciated.     Extraocular Movements: EOM normal.     Conjunctiva/sclera: Conjunctivae normal.     Pupils: Pupils are equal, round, and reactive to light.  Neck:     Thyroid: No thyroid mass or thyromegaly.     Vascular: No carotid bruit.     Trachea: Trachea normal.  Cardiovascular:     Rate and Rhythm: Normal rate and regular rhythm.     Pulses: Normal pulses.     Heart sounds: Normal heart sounds, S1 normal and S2 normal. No murmur heard.    No friction rub. No gallop.  Pulmonary:     Effort: Pulmonary effort is normal. No tachypnea or respiratory distress.     Breath sounds: Normal breath sounds. No decreased breath sounds, wheezing, rhonchi or rales.  Abdominal:     General: Bowel sounds are normal.     Palpations: Abdomen is soft.     Tenderness: There is no abdominal tenderness.  Musculoskeletal:     Cervical back: Normal range of motion and neck supple.  Skin:    General: Skin is warm, dry and intact.     Findings: No rash.  Neurological:     Mental Status: She is alert.  Psychiatric:        Mood and Affect: Mood is anxious. Mood is not depressed. Affect is tearful.        Speech: Speech normal.        Behavior:  Behavior normal. Behavior is cooperative.        Thought Content: Thought content normal.        Cognition and Memory: Cognition and memory normal.        Judgment: Judgment normal.       Results for orders placed or performed in visit on 03/31/22  VITAMIN D 25 Hydroxy (Vit-D Deficiency, Fractures)  Result Value Ref Range   VITD 34.64 30.00 - 100.00 ng/mL  Lipid panel  Result Value Ref Range   Cholesterol 170 0 - 200 mg/dL   Triglycerides 409.8 0.0 - 149.0 mg/dL   HDL 11.91 >47.82 mg/dL   VLDL 95.6 0.0 - 21.3 mg/dL   LDL Cholesterol 69 0 - 99 mg/dL   Total CHOL/HDL Ratio 2    NonHDL 89.91   Comprehensive metabolic panel  Result Value Ref Range   Sodium 135 135 - 145 mEq/L   Potassium 3.9 3.5 - 5.1 mEq/L   Chloride 98 96 - 112 mEq/L   CO2 28 19 - 32 mEq/L   Glucose, Bld 99 70 - 99 mg/dL   BUN 8 6 - 23 mg/dL   Creatinine, Ser 0.86 0.40 - 1.20 mg/dL   Total Bilirubin 0.6 0.2 - 1.2 mg/dL   Alkaline Phosphatase 116 39 - 117 U/L   AST 32 0 - 37 U/L   ALT 40 (H) 0 - 35 U/L   Total Protein 8.2 6.0 - 8.3 g/dL   Albumin 4.6 3.5 - 5.2 g/dL   GFR 57.84 >69.62 mL/min   Calcium 9.5 8.4 - 10.5 mg/dL    EKG: normal EKG, normal sinus rhythm, unchanged from previous tracings.  Assessment and Plan  Primary hypertension Assessment & Plan: Chronic with acute worsening Will evaluate with labs including thyroid, liver, kidney and CBC for possible secondary cause.  Patient already avoiding caffeine and decongestants. There is some discrepancy between her home cuff and our cuff here in the office but her blood pressure for the last 3-5 measurements in office have been elevated.  She has been under an extreme amount of caregiving stress with her husband possible dementia diagnosis.  She is already on max dose lisinopril 40 mg daily, max amlodipine 10 mg daily as well as Toprol XL 100 mg daily. We discussed potentially adding hydrochlorothiazide to her medication regimen to lower blood  pressure further.  She is hesitant given her history of hyponatremia.  I do think this is still an option given it has been normal the last 3 years.   I will forward my note to Dr. Mariah Milling cardiology for additional recommendations per patient request.   Orders: -     CBC with Differential/Platelet; Future -     TSH; Future -     Comprehensive metabolic panel; Future -     EKG 12-Lead    No follow-ups on file.   Kerby Nora, MD

## 2023-01-10 NOTE — Patient Instructions (Addendum)
Return when able for lab work in next few days/week.  I will consult with Dr. Mariah Milling about additional medication for hypertension. Continue to work on stress reduction and relaxation techniques.  Continue to work on lowering caffeine and avoiding decongestants. Continue moving forward with setting up counselor for caregiver stress. Can try 50 to 100 mg trazodone p.o. nightly as needed insomnia.

## 2023-01-10 NOTE — Assessment & Plan Note (Signed)
Chronic, recent worsening with additional stressors. Recommend continued venlafaxine XL 150 mg p.o. daily.   Occasionally using alprazolam as needed.  Trazodone half tablet did not help with sleep and she will try 1 full tablet versus 2 tablets at bedtime for sleep.   She is on wait list for 2 different counselors offices.  Possibly contributing to recent blood pressure elevations.

## 2023-01-11 ENCOUNTER — Other Ambulatory Visit: Payer: Self-pay | Admitting: Family Medicine

## 2023-01-15 NOTE — Progress Notes (Unsigned)
Cardiology Office Note    Date:  01/16/2023   ID:  Christy Freeman, DOB September 24, 1950, MRN [id number]  PCP:  Excell Seltzer, MD  Cardiologist:  Julien Nordmann, MD  Electrophysiologist:  None   Chief Complaint: Elevated blood pressure  History of Present Illness:   Christy Freeman is a 72 y.o. female with history of coronary calcification noted on CT chest, aortic atherosclerosis, HTN, HLD, hepatic steatosis, cholelithiasis, and GERD who presents for evaluation of elevated blood pressure.  Nuclear stress test in 03/2013 showed normal perfusion, normal LV systolic function, normal BP response to stress, and was overall low risk.  Echo in 03/2013 showed an EF of 60 to 65%, no regional wall motion abnormalities, trivial aortic insufficiency, normal RV systolic function and PASP.  Calcium score of 0 in 2015.  Mild coronary artery calcification noted on CT chest in 2019.  She was last seen in the office in 04/2022 and was without symptoms of angina or cardiac decompensation.  She reported well-controlled blood pressure at home with office blood pressure 142/86.  She contacted the office earlier this month after being evaluated at her PCP's office and noted to have blood pressure of 144/80 on lisinopril 40 mg, amlodipine 10 mg, and Toprol-XL 100 mg she (previously noted to have history of hyponatremia, she was hesitant to start HCTZ in this setting).  Over this past year, blood pressure and office visits has ranged from the 140s to 150s mmHg systolic.  She comes in today noting elevated BP readings ranging from the 140s to 150s systolic predominantly with occasional reading in the 160s systolic.  These readings have been obtained approximately 3 hours after her regular morning medications.  Not currently monitoring salt intake.  No symptoms of angina or cardiac decompensation.  No palpitations, presyncope, or syncope.  No progressive orthopnea.   Labs independently reviewed: 01/2023 - TSH  normal, potassium 4.2, BUN 15, serum creatinine 0.57, AST 45, ALT 42, albumin 4.7, Hgb 13.8, PLT 325 03/2022 - TC 170, TG 103, HDL 80, LDL 69  Past Medical History:  Diagnosis Date   Allergy    seasonal allergies   Anxiety    hx- on meds   Arthritis    Right hand   Blood transfusion without reported diagnosis 1989   Cancer (HCC)    basal cell on face   Cataract    Colon polyps    GERD (gastroesophageal reflux disease)    H/O: depression    Hypercholesterolemia    diet controlled   Hypertension    Lung nodule    left lung    Psoriasis     Past Surgical History:  Procedure Laterality Date   APPENDECTOMY  06/14/2017   BREAST BIOPSY Right 2000   benign   COLONOSCOPY  2016   TA x 1   CYST REMOVAL HAND Left    DILATION AND CURETTAGE OF UTERUS     LAPAROSCOPIC APPENDECTOMY N/A 06/14/2017   Procedure: APPENDECTOMY LAPAROSCOPIC;  Surgeon: Ricarda Frame, MD;  Location: [facility] ORS;  Service: General;  Laterality: N/A;   POLYPECTOMY  2016   TA    Current Medications: Current Meds  Medication Sig   Alpha-Lipoic Acid 200 MG CAPS Take by mouth.   ALPRAZolam (XANAX) 0.25 MG tablet Take 1 tablet (0.25 mg total) by mouth daily as needed for anxiety.   amLODipine (NORVASC) 10 MG tablet TAKE 1 TABLET(10 MG) BY MOUTH DAILY   Ascorbic Acid (VITAMIN C PO) Take by mouth. 40  mg   aspirin EC 81 MG tablet Take 81 mg by mouth daily. Swallow whole.   cetirizine (ZYRTEC) 10 MG tablet Take 10 mg by mouth daily.   Cholecalciferol (VITAMIN D3 PO) Take by mouth. 1000 units 2 times daily   clobetasol (TEMOVATE) 0.05 % external solution Apply 1 Application topically 2 (two) times daily as needed.   clobetasol ointment (TEMOVATE) 0.05 % Apply 1 Application topically 2 (two) times daily as needed.   Clobetasol Propionate 0.05 % shampoo Use three times weekly as needed   Coenzyme Q10 (COQ10) 100 MG CAPS Take by mouth.   ezetimibe (ZETIA) 10 MG tablet TAKE 1 TABLET BY MOUTH DAILY   Folic Acid  (FOLATE PO) Take by mouth. 400 mg   lisinopril (ZESTRIL) 40 MG tablet TAKE 1 TABLET(40 MG) BY MOUTH DAILY   MAGNESIUM PO Take 425 mg by mouth 2 (two) times daily.   metoprolol succinate (TOPROL-XL) 100 MG 24 hr tablet Take 1 tablet (100 mg total) by mouth 2 (two) times daily.   Omega-3 Fatty Acids (OMEGA 3 PO) Take by mouth. 1280 mg   omeprazole (PRILOSEC) 10 MG capsule TAKE 1 CAPSULE(10 MG) BY MOUTH DAILY   Probiotic Product (PROBIOTIC DAILY PO) Take 1 capsule by mouth in the morning, at noon, and at bedtime.   rosuvastatin (CRESTOR) 10 MG tablet TAKE 1 TABLET(10 MG) BY MOUTH DAILY   Sodium Hyaluronate, oral, (HYALURONIC ACID PO) Take by mouth. 125 mg plus Vitamin C 12.5 x 2   Sodium Sulfate-Mag Sulfate-KCl (SUTAB) [phone] MG TABS Take 12 tablets by mouth as directed. Pt is aware insurance will not cover her prep. Please use sutab coupon provided by patient to discount.   thiamine (VITAMIN B-1) 100 MG tablet Take 100 mg by mouth daily.   traZODone (DESYREL) 50 MG tablet Take 1 tablet (50 mg total) by mouth at bedtime as needed for sleep.   triamcinolone cream (KENALOG) 0.1 % Apply 1 Application topically 2 (two) times daily as needed.   venlafaxine XR (EFFEXOR-XR) 150 MG 24 hr capsule TAKE 1 CAPSULE(150 MG) BY MOUTH DAILY   vitamin B-12 (CYANOCOBALAMIN) 1000 MCG tablet Take 1,000 mcg by mouth daily.   [DISCONTINUED] metoprolol succinate (TOPROL-XL) 100 MG 24 hr tablet TAKE 1 TABLET BY MOUTH EVERY DAY WITH OR IMMEDIATELY FOLLOWING A MEAL    Allergies:   Patient has no known allergies.   Social History   Socioeconomic History   Marital status: Married    Spouse name: Sherrine Maples   Number of children: 0   Years of education: Not on file   Highest education level: Not on file  Occupational History    Comment:  chemist  Tobacco Use   Smoking status: Never    Passive exposure: Never   Smokeless tobacco: Never  Vaping Use   Vaping status: Never Used  Substance and Sexual Activity    Alcohol use: Yes    Alcohol/week: 14.0 standard drinks of alcohol    Types: 6 Glasses of wine, 8 Standard drinks or equivalent per week    Comment: 2 glasses of wine per night   Drug use: No   Sexual activity: Yes  Other Topics Concern   Not on file  Social History Narrative   She would desire CPR.   Does have a living will.   Social Determinants of Health   Financial Resource Strain: Low Risk  (04/06/2022)   Overall Financial Resource Strain (CARDIA)    Difficulty of Paying Living Expenses: Not hard at  all  Food Insecurity: No Food Insecurity (04/06/2022)   Hunger Vital Sign    Worried About Running Out of Food in the Last Year: Never true    Ran Out of Food in the Last Year: Never true  Transportation Needs: No Transportation Needs (04/06/2022)   PRAPARE - Administrator, Civil Service (Medical): No    Lack of Transportation (Non-Medical): No  Physical Activity: Sufficiently Active (04/06/2022)   Exercise Vital Sign    Days of Exercise per Week: 4 days    Minutes of Exercise per Session: 50 min  Stress: No Stress Concern Present (04/06/2022)   [employer] of Occupational Health - Occupational Stress Questionnaire    Feeling of Stress : Not at all  Social Connections: Moderately Integrated (04/06/2022)   Social Connection and Isolation Panel [NHANES]    Frequency of Communication with Friends and Family: More than three times a week    Frequency of Social Gatherings with Friends and Family: Twice a week    Attends Religious Services: Never    Database administrator or Organizations: Yes    Attends Engineer, structural: More than 4 times per year    Marital Status: Married     Family History:  The patient's family history includes Breast cancer (age of onset: 31) in an other family member; Diabetes in her mother; Heart disease in her brother; Heart failure in her mother; Hypertension in her father and mother; Stroke in her father and maternal  grandmother. There is no history of Colon cancer, Colon polyps, Esophageal cancer, Stomach cancer, or Rectal cancer.  ROS:   12-point review of systems is negative unless otherwise noted in the HPI.   EKGs/Labs/Other Studies Reviewed:    Studies reviewed were summarized above. The additional studies were reviewed today:  Calcium score 09/11/2013: IMPRESSION: Coronary calcium score of 0. This was 0 percentile for age and sex matched control. __________  2D echo 04/04/2013: - Left ventricle: The cavity size was normal. Systolic    function was normal. The estimated ejection fraction was    in the range of 60% to 65%. Wall motion was normal; there    were no regional wall motion abnormalities.  - Aortic valve: Trivial regurgitation.  - Left atrium: The atrium was normal in size.  - Right ventricle: Systolic function was normal.  - Pulmonary arteries: Systolic pressure was within the    normal range.  Impressions:   - Normal study.  __________  Nuclear stress test 03/24/2013: Normal perfusion, normal LV systolic function, normal study.   EKG:  EKG is not ordered today.    Recent Labs: 01/10/2023: ALT 42; BUN 15; Creatinine, Ser 0.57; Hemoglobin 13.8; Platelets 325.0; Potassium 4.2; Sodium 134; TSH 2.68  Recent Lipid Panel    Component Value Date/Time   CHOL 170 03/31/2022 0729   CHOL 258 (H) 01/28/2015 0852   TRIG 103.0 03/31/2022 0729   HDL 80.30 03/31/2022 0729   HDL 68 01/28/2015 0852   CHOLHDL 2 03/31/2022 0729   VLDL 20.6 03/31/2022 0729   LDLCALC 69 03/31/2022 0729   LDLCALC 154 (H) 01/28/2015 0852   LDLDIRECT 191.0 07/30/2014 1401    PHYSICAL EXAM:    VS:  BP (!) 158/78 (BP Location: Left Arm, Patient Position: Sitting, Cuff Size: Normal)   Pulse 88   Ht 5\' 4"  (1.626 m)   Wt 198 lb 9.6 oz (90.1 kg)   SpO2 97%   BMI 34.09 kg/m   BMI: Body  mass index is 34.09 kg/m.  Physical Exam Vitals reviewed.  Constitutional:      Appearance: She is well-developed.   HENT:     Head: Normocephalic and atraumatic.  Eyes:     General:        Right eye: No discharge.        Left eye: No discharge.  Neck:     Vascular: No JVD.  Cardiovascular:     Rate and Rhythm: Normal rate and regular rhythm.     Heart sounds: Normal heart sounds, S1 normal and S2 normal. Heart sounds not distant. No midsystolic click and no opening snap. No murmur heard.    No friction rub.  Pulmonary:     Effort: Pulmonary effort is normal. No respiratory distress.     Breath sounds: Normal breath sounds. No decreased breath sounds, wheezing or rales.  Chest:     Chest wall: No tenderness.  Abdominal:     General: There is no distension.  Musculoskeletal:     Cervical back: Normal range of motion.  Skin:    General: Skin is warm and dry.     Nails: There is no clubbing.  Neurological:     Mental Status: She is alert and oriented to person, place, and time.  Psychiatric:        Speech: Speech normal.        Behavior: Behavior normal.        Thought Content: Thought content normal.        Judgment: Judgment normal.     Wt Readings from Last 3 Encounters:  01/16/23 198 lb 9.6 oz (90.1 kg)  01/10/23 195 lb (88.5 kg)  01/08/23 193 lb (87.5 kg)     ASSESSMENT & PLAN:    HTN: Blood pressure has been running consistently in the 140s to 150s systolic for the better part of this year.  Query if this is related to excessive sodium intake and possible untreated sleep apnea.  For now, titrate Toprol-XL to 100 mg twice daily with continuation of amlodipine 10 mg and lisinopril 40 mg.  If needed moving forward, could consider transitioning metoprolol succinate to carvedilol or with possible addition of spironolactone.  She will obtain a 24-hour ambulatory BP monitor.  Coronary artery calcification/aortic atherosclerosis/HLD: No symptoms suggestive of angina or cardiac decompensation.  LDL 69.  She remains on rosuvastatin and ezetimibe.  Hepatic steatosis: LFTs mildly elevated on  check earlier this month.  Followed by PCP.  History of hyponatremia: Sodium 134 earlier this month.  Would attempt to avoid excessive sodium intake in effort to minimize fluid retention and elevated BP readings.  Suspect Effexor is contributing to hyponatremia.  Would attempt to avoid thiazide diuretic.   Disposition: F/u with Dr. Mariah Milling or an APP in 1 month.   Medication Adjustments/Labs and Tests Ordered: Current medicines are reviewed at length with the patient today.  Concerns regarding medicines are outlined above. Medication changes, Labs and Tests ordered today are summarized above and listed in the Patient Instructions accessible in Encounters.   Signed, Eula Listen, PA-C 01/16/2023 4:52 PM     [facility] [place] [address] [place], [place] [address] [phone]

## 2023-01-15 NOTE — Telephone Encounter (Signed)
Called patient and informed her of the following from Dr. Mariah Milling.  Wants to discuss including blood pressure, reason for ZIO monitor Would recommend we move her clinic appointment up from November to  next available Okay to schedule with APP Thx TGollan    Patient verbalized understanding. Will forwarded to scheduling to move appointment up.

## 2023-01-16 ENCOUNTER — Ambulatory Visit: Payer: Medicare HMO | Attending: Physician Assistant | Admitting: Physician Assistant

## 2023-01-16 ENCOUNTER — Encounter: Payer: Self-pay | Admitting: Physician Assistant

## 2023-01-16 VITALS — BP 158/78 | HR 88 | Ht 64.0 in | Wt 198.6 lb

## 2023-01-16 DIAGNOSIS — I700 Atherosclerosis of aorta: Secondary | ICD-10-CM | POA: Diagnosis not present

## 2023-01-16 DIAGNOSIS — I10 Essential (primary) hypertension: Secondary | ICD-10-CM | POA: Diagnosis not present

## 2023-01-16 DIAGNOSIS — E785 Hyperlipidemia, unspecified: Secondary | ICD-10-CM

## 2023-01-16 DIAGNOSIS — I2510 Atherosclerotic heart disease of native coronary artery without angina pectoris: Secondary | ICD-10-CM | POA: Diagnosis not present

## 2023-01-16 DIAGNOSIS — K760 Fatty (change of) liver, not elsewhere classified: Secondary | ICD-10-CM

## 2023-01-16 DIAGNOSIS — E871 Hypo-osmolality and hyponatremia: Secondary | ICD-10-CM

## 2023-01-16 MED ORDER — METOPROLOL SUCCINATE ER 100 MG PO TB24: 100.0000 mg | ORAL_TABLET | Freq: Two times a day (BID) | ORAL | 3 refills | Status: DC

## 2023-01-16 NOTE — Patient Instructions (Signed)
Medication Instructions:  Your physician recommends the following medication changes.  INCREASE: Metoprolol Succinate 100 mg twice daily    *If you need a refill on your cardiac medications before your next appointment, please call your pharmacy*   Lab Work: none If you have labs (blood work) drawn today and your tests are completely normal, you will receive your results only by: MyChart Message (if you have MyChart) OR A paper copy in the mail If you have any lab test that is abnormal or we need to change your treatment, we will call you to review the results.   Testing/Procedures: none   Follow-Up: At [facility], you and your health needs are our priority.  As part of our continuing mission to provide you with exceptional heart care, we have created designated Provider Care Teams.  These Care Teams include your primary Cardiologist (physician) and Advanced Practice Providers (APPs -  Physician Assistants and Nurse Practitioners) who all work together to provide you with the care you need, when you need it.  We recommend signing up for the patient portal called "MyChart".  Sign up information is provided on this After Visit Summary.  MyChart is used to connect with patients for Virtual Visits (Telemedicine).  Patients are able to view lab/test results, encounter notes, upcoming appointments, etc.  Non-urgent messages can be sent to your provider as well.   To learn more about what you can do with MyChart, go to [email or web address].    Your next appointment:   1 month(s)  Provider:   Eula Listen, PA-C

## 2023-01-18 DIAGNOSIS — R69 Illness, unspecified: Secondary | ICD-10-CM | POA: Diagnosis not present

## 2023-01-19 ENCOUNTER — Encounter: Payer: Self-pay | Admitting: Gastroenterology

## 2023-01-23 ENCOUNTER — Encounter: Payer: Self-pay | Admitting: Gastroenterology

## 2023-01-23 ENCOUNTER — Telehealth: Payer: Self-pay | Admitting: Cardiovascular Disease

## 2023-01-23 MED ORDER — EZETIMIBE 10 MG PO TABS: 10.0000 mg | ORAL_TABLET | Freq: Every day | ORAL | 0 refills | Status: DC

## 2023-01-23 NOTE — Telephone Encounter (Signed)
Requested Prescriptions  ° °Signed Prescriptions Disp Refills  ° ezetimibe (ZETIA) 10 MG tablet 90 tablet 0  °  Sig: Take 1 tablet (10 mg total) by mouth daily.  °  Authorizing Provider: GOLLAN, TIMOTHY J  °  Ordering User: LOPEZ, MARINA C  ° ° °

## 2023-01-23 NOTE — Telephone Encounter (Signed)
*  STAT* If patient is at the pharmacy, call can be transferred to refill team.   1. Which medications need to be refilled? (please list name of each medication and dose if known)  ezetimibe (ZETIA) 10 MG tablet  2. Which pharmacy/location (including street and city if local pharmacy) is medication to be sent to? [facility], [place] [address] AT [place]  3. Do they need a 30 day or 90 day supply?  90 day supply

## 2023-01-24 ENCOUNTER — Telehealth: Payer: Self-pay | Admitting: Pulmonary Disease

## 2023-01-24 NOTE — Telephone Encounter (Signed)
Pt. Calling wanting to go ahead and get sched. For HST looks like she was supposed to do it back in Dec. But is ready now

## 2023-01-24 NOTE — Telephone Encounter (Signed)
Insurance had changed. I have left a message asking the patient to call me back to schedule picking up HST machine

## 2023-01-29 NOTE — Telephone Encounter (Signed)
I have spoke with the patient and she is aware to pick up the HST machine at [facility] on 02/02/23 @ 8:30am

## 2023-01-29 NOTE — Telephone Encounter (Signed)
Pt. Calling back to set up apt. For HST please advise

## 2023-02-02 ENCOUNTER — Telehealth: Payer: Self-pay | Admitting: Cardiovascular Disease

## 2023-02-02 ENCOUNTER — Ambulatory Visit: Payer: Medicare HMO | Admitting: *Deleted

## 2023-02-02 ENCOUNTER — Ambulatory Visit: Payer: Medicare HMO

## 2023-02-02 VITALS — BP 157/86 | Ht 64.0 in | Wt 198.5 lb

## 2023-02-02 DIAGNOSIS — I10 Essential (primary) hypertension: Secondary | ICD-10-CM | POA: Diagnosis not present

## 2023-02-02 DIAGNOSIS — G4733 Obstructive sleep apnea (adult) (pediatric): Secondary | ICD-10-CM

## 2023-02-02 NOTE — Patient Instructions (Signed)
Instructed patient to monitor blood pressures twice a day 2 hours after medications. Advised that the 24 hour monitoring BP machine will be ordered but it may take a couple of months for her to receive it. Instructed her to give [place] a call if she should have any further questions.

## 2023-02-02 NOTE — Telephone Encounter (Signed)
Spoke with patient and she is in the building and would like some help with her blood pressure monitor. Advised that she could come to the office and I would be happy to assist.

## 2023-02-02 NOTE — Telephone Encounter (Signed)
Patient calling in to see if she can get help with setting her bp cuff up. Wanting to see if she can stop by the office this morning. Please advise

## 2023-02-02 NOTE — Progress Notes (Signed)
Patient in to have some assistance with her BP monitoring device. We hooked everything up and did some readings. Discussed with Eula Listen PA-C the monitoring device and request for 24 hour blood pressure cuff and measurements. Patient bought this cuff from [place] and it does save a trend of readings but is not 24 hour automatic monitoring. Verbal order received to order the monitoring and advised patient that it can take some time to get that device. Blood pressure readings documented. We also discussed monitoring blood pressures until she gets the 24 hour machine. Instructed her to monitor blood pressures twice a day 2 hours after medications. She verbalized understanding with no further questions.

## 2023-02-06 NOTE — Telephone Encounter (Signed)
Patient picked up HST machine on 02/02/23 and returned the machine today

## 2023-02-07 ENCOUNTER — Ambulatory Visit (AMBULATORY_SURGERY_CENTER): Payer: Medicare HMO | Admitting: Gastroenterology

## 2023-02-07 ENCOUNTER — Encounter: Payer: Self-pay | Admitting: Gastroenterology

## 2023-02-07 ENCOUNTER — Telehealth: Payer: Self-pay | Admitting: Pulmonary Disease

## 2023-02-07 VITALS — BP 168/85 | HR 77 | Temp 98.4°F | Resp 14 | Ht 63.85 in | Wt 197.0 lb

## 2023-02-07 DIAGNOSIS — K5730 Diverticulosis of large intestine without perforation or abscess without bleeding: Secondary | ICD-10-CM | POA: Diagnosis not present

## 2023-02-07 DIAGNOSIS — Z86010 Personal history of colonic polyps: Secondary | ICD-10-CM | POA: Diagnosis not present

## 2023-02-07 DIAGNOSIS — Z09 Encounter for follow-up examination after completed treatment for conditions other than malignant neoplasm: Secondary | ICD-10-CM

## 2023-02-07 DIAGNOSIS — K640 First degree hemorrhoids: Secondary | ICD-10-CM | POA: Diagnosis not present

## 2023-02-07 DIAGNOSIS — G4733 Obstructive sleep apnea (adult) (pediatric): Secondary | ICD-10-CM | POA: Diagnosis not present

## 2023-02-07 MED ORDER — SODIUM CHLORIDE 0.9 % IV SOLN: 500.0000 mL | Freq: Once | INTRAVENOUS | Status: DC

## 2023-02-07 NOTE — Progress Notes (Signed)
Vss nad trans to pacu 

## 2023-02-07 NOTE — Progress Notes (Signed)
See 01/16/2023 H&P no changes

## 2023-02-07 NOTE — Telephone Encounter (Signed)
HST showed mild OSA with AHI 13/ hr & low sat 78% Reviewed 2 night study  Would suggest treatment with either dental appliance or CPAP  If she is willing, would suggest initiating treatment with auto CPAP 5 to 15 cm, mask of choice. Schedule office visit with APP/me in 6 weeks after starting

## 2023-02-07 NOTE — Op Note (Signed)
[facility] Patient Name: Christy Freeman Procedure Date: 02/07/2023 9:24 AM MRN: [id number] Endoscopist: Meryl Dare , MD, [phone] Age: 72 Referring MD:  Date of Birth: July 10, 1950 Gender: Female Account #: [id number] Procedure:                Colonoscopy Indications:              Surveillance: Personal history of adenomatous                            polyps on last colonoscopy 3 years ago Medicines:                Monitored Anesthesia Care Procedure:                Pre-Anesthesia Assessment:                           - Prior to the procedure, a History and Physical                            was performed, and patient medications and                            allergies were reviewed. The patient's tolerance of                            previous anesthesia was also reviewed. The risks                            and benefits of the procedure and the sedation                            options and risks were discussed with the patient.                            All questions were answered, and informed consent                            was obtained. Prior Anticoagulants: The patient has                            taken no anticoagulant or antiplatelet agents. ASA                            Grade Assessment: II - A patient with mild systemic                            disease. After reviewing the risks and benefits,                            the patient was deemed in satisfactory condition to                            undergo the procedure.  After obtaining informed consent, the colonoscope                            was passed under direct vision. Throughout the                            procedure, the patient's blood pressure, pulse, and                            oxygen saturations were monitored continuously. The                            Olympus Scope SN: [id number] was introduced through                            the anus and advanced to  the the cecum, identified                            by appendiceal orifice and ileocecal valve. The                            ileocecal valve, appendiceal orifice, and rectum                            were photographed. The quality of the bowel                            preparation was good. The colonoscopy was performed                            without difficulty. The patient tolerated the                            procedure well. Scope In: 9:28:35 AM Scope Out: 9:40:09 AM Scope Withdrawal Time: 0 hours 7 minutes 55 seconds  Total Procedure Duration: 0 hours 11 minutes 34 seconds  Findings:                 The perianal and digital rectal examinations were                            normal.                           External and internal hemorrhoids were found during                            retroflexion. The hemorrhoids were small and Grade                            I (internal hemorrhoids that do not prolapse).                           Multiple small-mouthed diverticula were found in  the left colon. There was no evidence of                            diverticular bleeding.                           The exam was otherwise without abnormality on                            direct and retroflexion views. Complications:            No immediate complications. Estimated blood loss:                            None. Estimated Blood Loss:     Estimated blood loss: none. Impression:               - External and internal hemorrhoids.                           - Mild diverticulosis in the left colon.                           - The examination was otherwise normal on direct                            and retroflexion views.                           - No specimens collected. Recommendation:           - Patient has a contact number available for                            emergencies. The signs and symptoms of potential                            delayed  complications were discussed with the                            patient. Return to normal activities tomorrow.                            Written discharge instructions were provided to the                            patient.                           - Resume previous diet plus high fiber.                           - Continue present medications.                           - No repeat colonoscopy due to age and the absence  of colonic polyps. Meryl Dare, MD 02/07/2023 9:47:19 AM This report has been signed electronically.

## 2023-02-07 NOTE — Progress Notes (Signed)
Pt's states no medical or surgical changes since previsit or office visit. 

## 2023-02-07 NOTE — Patient Instructions (Addendum)
-   Resume previous diet plus high fiber. - Continue present medications. - No repeat colonoscopy due to age and the absence of colonic polyps.  YOU HAD AN ENDOSCOPIC PROCEDURE TODAY AT THE [facility]:   Refer to the procedure report that was given to you for any specific questions about what was found during the examination.  If the procedure report does not answer your questions, please call your gastroenterologist to clarify.  If you requested that your care partner not be given the details of your procedure findings, then the procedure report has been included in a sealed envelope for you to review at your convenience later.  YOU SHOULD EXPECT: Some feelings of bloating in the abdomen. Passage of more gas than usual.  Walking can help get rid of the air that was put into your GI tract during the procedure and reduce the bloating. If you had a lower endoscopy (such as a colonoscopy or flexible sigmoidoscopy) you may notice spotting of blood in your stool or on the toilet paper. If you underwent a bowel prep for your procedure, you may not have a normal bowel movement for a few days.  Please Note:  You might notice some irritation and congestion in your nose or some drainage.  This is from the oxygen used during your procedure.  There is no need for concern and it should clear up in a day or so.  SYMPTOMS TO REPORT IMMEDIATELY:  Following lower endoscopy (colonoscopy or flexible sigmoidoscopy):  Excessive amounts of blood in the stool  Significant tenderness or worsening of abdominal pains  Swelling of the abdomen that is new, acute  Fever of 100F or higher  For urgent or emergent issues, a gastroenterologist can be reached at any hour by calling [phone]. Do not use MyChart messaging for urgent concerns.    DIET:  We do recommend a small meal at first, but then you may proceed to your regular diet.  Drink plenty of fluids but you should avoid alcoholic beverages for 24  hours.  ACTIVITY:  You should plan to take it easy for the rest of today and you should NOT DRIVE or use heavy machinery until tomorrow (because of the sedation medicines used during the test).    FOLLOW UP: Our staff will call the number listed on your records the next business day following your procedure.  We will call around 7:15- 8:00 am to check on you and address any questions or concerns that you may have regarding the information given to you following your procedure. If we do not reach you, we will leave a message.     If any biopsies were taken you will be contacted by phone or by letter within the next 1-3 weeks.  Please call us at [phone] if you have not heard about the biopsies in 3 weeks.    SIGNATURES/CONFIDENTIALITY: You and/or your care partner have signed paperwork which will be entered into your electronic medical record.  These signatures attest to the fact that that the information above on your After Visit Summary has been reviewed and is understood.  Full responsibility of the confidentiality of this discharge information lies with you and/or your care-partner.

## 2023-02-08 ENCOUNTER — Telehealth: Payer: Self-pay

## 2023-02-08 DIAGNOSIS — G4733 Obstructive sleep apnea (adult) (pediatric): Secondary | ICD-10-CM

## 2023-02-08 NOTE — Telephone Encounter (Signed)
Called and lvm for patient to call us back also sent patient a my chart message

## 2023-02-08 NOTE — Telephone Encounter (Signed)
  Follow up Call-     02/07/2023    8:42 AM  Call back number  Post procedure Call Back phone  # [phone]  Permission to leave phone message Yes     Patient questions:  Do you have a fever, pain , or abdominal swelling? No. Pain Score  0 *  Have you tolerated food without any problems? Yes.    Have you been able to return to your normal activities? Yes.    Do you have any questions about your discharge instructions: Diet   No. Medications  No. Follow up visit  No.  Do you have questions or concerns about your Care? No.  Actions: * If pain score is 4 or above: No action needed, pain <4.

## 2023-02-09 ENCOUNTER — Ambulatory Visit (INDEPENDENT_AMBULATORY_CARE_PROVIDER_SITE_OTHER): Payer: Medicare HMO

## 2023-02-09 ENCOUNTER — Ambulatory Visit: Payer: Medicare HMO | Admitting: Podiatry

## 2023-02-09 DIAGNOSIS — M1991 Primary osteoarthritis, unspecified site: Secondary | ICD-10-CM | POA: Diagnosis not present

## 2023-02-09 DIAGNOSIS — M1990 Unspecified osteoarthritis, unspecified site: Secondary | ICD-10-CM | POA: Diagnosis not present

## 2023-02-09 DIAGNOSIS — M778 Other enthesopathies, not elsewhere classified: Secondary | ICD-10-CM

## 2023-02-09 DIAGNOSIS — M2012 Hallux valgus (acquired), left foot: Secondary | ICD-10-CM

## 2023-02-09 DIAGNOSIS — M2011 Hallux valgus (acquired), right foot: Secondary | ICD-10-CM

## 2023-02-09 MED ORDER — BETAMETHASONE SOD PHOS & ACET 6 (3-3) MG/ML IJ SUSP
3.0000 mg | Freq: Once | INTRAMUSCULAR | Status: AC
Start: 2023-02-09 — End: 2023-02-09
  Administered 2023-02-09: 3 mg via INTRA_ARTICULAR

## 2023-02-09 MED ORDER — MELOXICAM 15 MG PO TABS: 15.0000 mg | ORAL_TABLET | Freq: Every day | ORAL | 1 refills | Status: DC

## 2023-02-09 NOTE — Progress Notes (Signed)
Chief Complaint  Patient presents with   Arthritis    feet    HPI: 72 y.o. female presenting today for new complaint of pain and tenderness associated to the bilateral feet right greater than the left.  Patient is concerned for arthritis.  She has been experiencing pain and tenderness throughout the right midfoot almost on a daily basis.  She has not done anything for treatment  Past Medical History:  Diagnosis Date   Allergy    seasonal allergies   Anxiety    hx- on meds   Arthritis    Right hand   Blood transfusion without reported diagnosis 1989   Cancer (HCC)    basal cell on face   Cataract    Colon polyps    GERD (gastroesophageal reflux disease)    H/O: depression    Hypercholesterolemia    diet controlled   Hypertension    Lung nodule    left lung    Psoriasis     Past Surgical History:  Procedure Laterality Date   APPENDECTOMY  06/14/2017   BREAST BIOPSY Right 2000   benign   COLONOSCOPY  2016   TA x 1   CYST REMOVAL HAND Left    DILATION AND CURETTAGE OF UTERUS     LAPAROSCOPIC APPENDECTOMY N/A 06/14/2017   Procedure: APPENDECTOMY LAPAROSCOPIC;  Surgeon: Ricarda Frame, MD;  Location: [facility] ORS;  Service: General;  Laterality: N/A;   POLYPECTOMY  2016   TA    No Known Allergies   Physical Exam: General: The patient is alert and oriented x3 in no acute distress.  Dermatology: Skin is warm, dry and supple bilateral lower extremities.   Vascular: Palpable pedal pulses bilaterally. Capillary refill within normal limits.  No appreciable edema.  No erythema.  Neurological: Grossly intact via light touch  Musculoskeletal Exam: Degenerative changes noted with pain on palpation throughout the second third TMT of the bilateral feet right greater than the left.  There is dorsal osseous prominence noted throughout the midtarsal joint of the right foot as well consistent with findings of arthritis  Radiographic Exam B/L feet 02/09/2023:  Normal osseous  mineralization.  No acute fractures identified.  Degenerative changes noted specifically to the second and third TMT bilateral right greater than the left.  The right foot does demonstrate some periarticular 'beaking' on lateral view.  Moderate hallux valgus deformity also noted bilateral  Assessment/Plan of Care: 1.  DJD/arthritis bilateral TMT. RT > LT 2.  Moderate hallux valgus bilateral  -Patient evaluated.  X-rays reviewed -Injection of 0.5 cc Celestone Soluspan injected into the second third TMT of the right foot -Prescription for meloxicam 15 mg daily.  Take occasionally as needed -Recommend good supportive shoes with arch supports to support the medial longitudinal arch of the foot.  Advised against going barefoot -We did discuss surgery as a last resort however I do not believe the patient is at the point of needing surgery since she has not pursued conservative care for a while. -In regards to the bunion these are asymptomatic.  Recommend wide shoes that do not irritate or constrict the toebox area.  Conservative treatment for now       Felecia Shelling, DPM [facility]  Dr. Felecia Shelling, DPM    [address]  [place], [place] [address]                Office [phone]  Fax [phone]

## 2023-02-09 NOTE — Telephone Encounter (Signed)
Patient would like order for CPAP machine. Patient phone number is [phone].

## 2023-02-13 ENCOUNTER — Encounter: Payer: Self-pay | Admitting: Podiatry

## 2023-02-16 ENCOUNTER — Encounter: Payer: Self-pay | Admitting: Family Medicine

## 2023-02-18 NOTE — Progress Notes (Deleted)
Cardiology Office Note    Date:  02/18/2023   ID:  Christy Freeman, DOB 06-28-1950, MRN [id number]  PCP:  Excell Seltzer, MD  Cardiologist:  Julien Nordmann, MD  Electrophysiologist:  None   Chief Complaint: ***  History of Present Illness:   Christy Freeman is a 72 y.o. female with history of ***  ***   Labs independently reviewed: 01/2023 - TSH normal, potassium 4.2, BUN 15, serum creatinine 0.57, AST 45, ALT 42, albumin 4.7, Hgb 13.8, PLT 325 03/2022 - TC 170, TG 103, HDL 80, LDL 69  Past Medical History:  Diagnosis Date   Allergy    seasonal allergies   Anxiety    hx- on meds   Arthritis    Right hand   Blood transfusion without reported diagnosis 1989   Cancer (HCC)    basal cell on face   Cataract    Colon polyps    GERD (gastroesophageal reflux disease)    H/O: depression    Hypercholesterolemia    diet controlled   Hypertension    Lung nodule    left lung    Psoriasis     Past Surgical History:  Procedure Laterality Date   APPENDECTOMY  06/14/2017   BREAST BIOPSY Right 2000   benign   COLONOSCOPY  2016   TA x 1   CYST REMOVAL HAND Left    DILATION AND CURETTAGE OF UTERUS     LAPAROSCOPIC APPENDECTOMY N/A 06/14/2017   Procedure: APPENDECTOMY LAPAROSCOPIC;  Surgeon: Ricarda Frame, MD;  Location: [facility];  Service: General;  Laterality: N/A;   POLYPECTOMY  2016   TA    Current Medications: No outpatient medications have been marked as taking for the 02/21/23 encounter (Appointment) with Sondra Barges, PA-C.    Allergies:   Patient has no known allergies.   Social History   Socioeconomic History   Marital status: Married    Spouse name: Christy Freeman   Number of children: 0   Years of education: Not on file   Highest education level: Not on file  Occupational History    Comment:  chemist  Tobacco Use   Smoking status: Never    Passive exposure: Never   Smokeless tobacco: Never  Vaping Use   Vaping status: Never Used  Substance and  Sexual Activity   Alcohol use: Yes    Alcohol/week: 14.0 standard drinks of alcohol    Types: 6 Glasses of wine, 8 Standard drinks or equivalent per week    Comment: 2 glasses of wine per night   Drug use: No   Sexual activity: Yes  Other Topics Concern   Not on file  Social History Narrative   She would desire CPR.   Does have a living will.   Social Determinants of Health   Financial Resource Strain: Low Risk  (04/06/2022)   Overall Financial Resource Strain (CARDIA)    Difficulty of Paying Living Expenses: Not hard at all  Food Insecurity: No Food Insecurity (04/06/2022)   Hunger Vital Sign    Worried About Running Out of Food in the Last Year: Never true    Ran Out of Food in the Last Year: Never true  Transportation Needs: No Transportation Needs (04/06/2022)   PRAPARE - Administrator, Civil Service (Medical): No    Lack of Transportation (Non-Medical): No  Physical Activity: Sufficiently Active (04/06/2022)   Exercise Vital Sign    Days of Exercise per Week: 4 days  Minutes of Exercise per Session: 50 min  Stress: No Stress Concern Present (04/06/2022)   [employer] of Occupational Health - Occupational Stress Questionnaire    Feeling of Stress : Not at all  Social Connections: Moderately Integrated (04/06/2022)   Social Connection and Isolation Panel [NHANES]    Frequency of Communication with Friends and Family: More than three times a week    Frequency of Social Gatherings with Friends and Family: Twice a week    Attends Religious Services: Never    Database administrator or Organizations: Yes    Attends Engineer, structural: More than 4 times per year    Marital Status: Married     Family History:  The patient's family history includes Breast cancer (age of onset: 60) in an other family member; Diabetes in her mother; Heart disease in her brother; Heart failure in her mother; Hypertension in her father and mother; Stroke in her father and  maternal grandmother. There is no history of Colon cancer, Colon polyps, Esophageal cancer, Stomach cancer, or Rectal cancer.  ROS:   12-point review of systems is negative unless otherwise noted in the HPI.   EKGs/Labs/Other Studies Reviewed:    Studies reviewed were summarized above. The additional studies were reviewed today:  Calcium score 09/11/2013: IMPRESSION: Coronary calcium score of 0. This was 0 percentile for age and sex matched control. __________   2D echo 04/04/2013: - Left ventricle: The cavity size was normal. Systolic    function was normal. The estimated ejection fraction was    in the range of 60% to 65%. Wall motion was normal; there    were no regional wall motion abnormalities.  - Aortic valve: Trivial regurgitation.  - Left atrium: The atrium was normal in size.  - Right ventricle: Systolic function was normal.  - Pulmonary arteries: Systolic pressure was within the    normal range.  Impressions:   - Normal study.  __________   Nuclear stress test 03/24/2013: Normal perfusion, normal LV systolic function, normal study.   EKG:  EKG is ordered today.  The EKG ordered today demonstrates ***  Recent Labs: 01/10/2023: ALT 42; BUN 15; Creatinine, Ser 0.57; Hemoglobin 13.8; Platelets 325.0; Potassium 4.2; Sodium 134; TSH 2.68  Recent Lipid Panel    Component Value Date/Time   CHOL 170 03/31/2022 0729   CHOL 258 (H) 01/28/2015 0852   TRIG 103.0 03/31/2022 0729   HDL 80.30 03/31/2022 0729   HDL 68 01/28/2015 0852   CHOLHDL 2 03/31/2022 0729   VLDL 20.6 03/31/2022 0729   LDLCALC 69 03/31/2022 0729   LDLCALC 154 (H) 01/28/2015 0852   LDLDIRECT 191.0 07/30/2014 1401    PHYSICAL EXAM:    VS:  There were no vitals taken for this visit.  BMI: There is no height or weight on file to calculate BMI.  Physical Exam  Wt Readings from Last 3 Encounters:  02/07/23 197 lb (89.4 kg)  02/02/23 198 lb 8 oz (90 kg)  01/16/23 198 lb 9.6 oz (90.1 kg)      ASSESSMENT & PLAN:   ***   {Are you ordering a CV Procedure (e.g. stress test, cath, DCCV, TEE, etc)?   Press F2        :[id number]}     Disposition: F/u with Dr. Mariah Milling or an APP in ***.   Medication Adjustments/Labs and Tests Ordered: Current medicines are reviewed at length with the patient today.  Concerns regarding medicines are outlined above. Medication changes, Labs and  Tests ordered today are summarized above and listed in the Patient Instructions accessible in Encounters.   Signed, Eula Listen, PA-C 02/18/2023 10:16 AM     [facility] [place] [address] [place], [place] [address] [phone]

## 2023-02-21 ENCOUNTER — Ambulatory Visit: Payer: Medicare HMO | Admitting: Physician Assistant

## 2023-02-22 ENCOUNTER — Other Ambulatory Visit: Payer: Self-pay | Admitting: *Deleted

## 2023-02-22 DIAGNOSIS — I10 Essential (primary) hypertension: Secondary | ICD-10-CM

## 2023-02-22 MED ORDER — LISINOPRIL 40 MG PO TABS
40.0000 mg | ORAL_TABLET | Freq: Every day | ORAL | 3 refills | Status: DC
Start: 2023-02-22 — End: 2024-01-22

## 2023-02-26 ENCOUNTER — Telehealth: Payer: Self-pay

## 2023-02-26 DIAGNOSIS — G4733 Obstructive sleep apnea (adult) (pediatric): Secondary | ICD-10-CM | POA: Diagnosis not present

## 2023-02-26 NOTE — Telephone Encounter (Signed)
Called and lvm for patient to call us  back to set up an appt . Received  an faxed from  [employer] for patient needing to come in for ov for c-pap supplies pt will need to be seen between 03/29/23-05/27/23. Left detailed message to let patient know that she make a Video Visit or in-person visit.

## 2023-03-21 ENCOUNTER — Ambulatory Visit: Payer: Medicare HMO | Admitting: Physician Assistant

## 2023-03-27 ENCOUNTER — Other Ambulatory Visit: Payer: Self-pay | Admitting: Family Medicine

## 2023-03-27 NOTE — Telephone Encounter (Signed)
Please schedule [facility] Wellness with nurse and CPE with fasting labs prior with Dr. Ermalene Searing after 04/08/2023.

## 2023-03-27 NOTE — Telephone Encounter (Signed)
Patient has been scheduled

## 2023-03-28 DIAGNOSIS — G4733 Obstructive sleep apnea (adult) (pediatric): Secondary | ICD-10-CM | POA: Diagnosis not present

## 2023-04-03 DIAGNOSIS — H2513 Age-related nuclear cataract, bilateral: Secondary | ICD-10-CM | POA: Diagnosis not present

## 2023-04-03 DIAGNOSIS — H43813 Vitreous degeneration, bilateral: Secondary | ICD-10-CM | POA: Diagnosis not present

## 2023-04-10 ENCOUNTER — Other Ambulatory Visit: Payer: Self-pay | Admitting: Family Medicine

## 2023-04-10 DIAGNOSIS — Z1231 Encounter for screening mammogram for malignant neoplasm of breast: Secondary | ICD-10-CM

## 2023-04-17 DIAGNOSIS — Z0100 Encounter for examination of eyes and vision without abnormal findings: Secondary | ICD-10-CM | POA: Diagnosis not present

## 2023-04-18 DIAGNOSIS — R69 Illness, unspecified: Secondary | ICD-10-CM | POA: Diagnosis not present

## 2023-04-20 ENCOUNTER — Encounter: Payer: Self-pay | Admitting: Physician Assistant

## 2023-04-20 ENCOUNTER — Ambulatory Visit: Payer: Medicare HMO | Attending: Physician Assistant | Admitting: Physician Assistant

## 2023-04-20 VITALS — BP 130/77 | HR 68 | Ht 64.0 in | Wt 198.8 lb

## 2023-04-20 DIAGNOSIS — I10 Essential (primary) hypertension: Secondary | ICD-10-CM | POA: Diagnosis not present

## 2023-04-20 DIAGNOSIS — G4733 Obstructive sleep apnea (adult) (pediatric): Secondary | ICD-10-CM | POA: Diagnosis not present

## 2023-04-20 DIAGNOSIS — I2510 Atherosclerotic heart disease of native coronary artery without angina pectoris: Secondary | ICD-10-CM | POA: Diagnosis not present

## 2023-04-20 DIAGNOSIS — Z6834 Body mass index (BMI) 34.0-34.9, adult: Secondary | ICD-10-CM | POA: Diagnosis not present

## 2023-04-20 DIAGNOSIS — K760 Fatty (change of) liver, not elsewhere classified: Secondary | ICD-10-CM | POA: Diagnosis not present

## 2023-04-20 DIAGNOSIS — I700 Atherosclerosis of aorta: Secondary | ICD-10-CM | POA: Diagnosis not present

## 2023-04-20 DIAGNOSIS — E66811 Obesity, class 1: Secondary | ICD-10-CM | POA: Diagnosis not present

## 2023-04-20 DIAGNOSIS — E871 Hypo-osmolality and hyponatremia: Secondary | ICD-10-CM

## 2023-04-20 DIAGNOSIS — Z0100 Encounter for examination of eyes and vision without abnormal findings: Secondary | ICD-10-CM | POA: Diagnosis not present

## 2023-04-20 DIAGNOSIS — E785 Hyperlipidemia, unspecified: Secondary | ICD-10-CM | POA: Diagnosis not present

## 2023-04-20 NOTE — Progress Notes (Signed)
Cardiology Office Note    Date:  04/20/2023   ID:  Christy Freeman, DOB 1950/10/07, MRN [id number]  PCP:  Excell Seltzer, MD  Cardiologist:  Julien Nordmann, MD  Electrophysiologist:  None   Chief Complaint: Follow-up  History of Present Illness:   Christy Freeman is a 72 y.o. female with history of coronary calcification noted on CT chest, aortic atherosclerosis, HTN, HLD, hepatic steatosis, cholelithiasis, OSA, and GERD who presents for follow up of HTN.   Nuclear stress test in 03/2013 showed normal perfusion, normal LV systolic function, normal BP response to stress, and was overall low risk.  Echo in 03/2013 showed an EF of 60 to 65%, no regional wall motion abnormalities, trivial aortic insufficiency, normal RV systolic function and PASP.  Calcium score of 0 in 2015.  Mild coronary artery calcification noted on CT chest in 2019.   She was last seen in the office in 01/2023 reporting BP readings in the 140s-150s systolic on lisinopril 40 mg, amlodipine 10 mg, and Toprol-XL 100 mg.  She was not monitoring salt intake.  It was suspected to the elevated BP readings were in the setting of increased sodium intake and possible untreated sleep apnea.  It was felt that Effexor was contributing to her hyponatremia.  Recommended minimization of sodium intake to minimize of fluid retention and BP readings.  Toprol was titrated to 100 mg twice daily with continuation of amlodipine 10 mg and lisinopril 40 mg.  Subsequent home sleep study showed mild sleep apnea.  She comes in doing well from a cardiac perspective and is without symptoms of angina or cardiac decompensation.  Notes improvement in palpitation burden and blood pressure following titration of Toprol-XL as outlined above.  No presyncope or syncope.  No progressive orthopnea or significant lower extremity swelling.  She is significantly limited by arthritis involving the right foot and has been prescribed Mobic for this.  In the setting of  her limited functional status, she is concerned about weight gain and cardiovascular health, asking about potential GLP-1 therapy.  No personal or family history of medullary thyroid cancer or multiple endocrine neoplasia syndrome type II.   Labs independently reviewed: 01/2023 - TSH normal, potassium 4.2, BUN 15, serum creatinine 0.57, AST 45, ALT 42, albumin 4.7, Hgb 13.8, PLT 325 03/2022 - TC 170, TG 103, HDL 80, LDL 69  Past Medical History:  Diagnosis Date   Allergy    seasonal allergies   Anxiety    hx- on meds   Arthritis    Right hand   Blood transfusion without reported diagnosis 1989   Cancer (HCC)    basal cell on face   Cataract    Colon polyps    GERD (gastroesophageal reflux disease)    H/O: depression    Hypercholesterolemia    diet controlled   Hypertension    Lung nodule    left lung    Psoriasis     Past Surgical History:  Procedure Laterality Date   APPENDECTOMY  06/14/2017   BREAST BIOPSY Right 2000   benign   COLONOSCOPY  2016   TA x 1   CYST REMOVAL HAND Left    DILATION AND CURETTAGE OF UTERUS     LAPAROSCOPIC APPENDECTOMY N/A 06/14/2017   Procedure: APPENDECTOMY LAPAROSCOPIC;  Surgeon: Ricarda Frame, MD;  Location: [facility] ORS;  Service: General;  Laterality: N/A;   POLYPECTOMY  2016   TA    Current Medications: Current Meds  Medication Sig  Alpha-Lipoic Acid 200 MG CAPS Take by mouth.   ALPRAZolam (XANAX) 0.25 MG tablet Take 1 tablet (0.25 mg total) by mouth daily as needed for anxiety.   amLODipine (NORVASC) 10 MG tablet TAKE 1 TABLET(10 MG) BY MOUTH DAILY   Ascorbic Acid (VITAMIN C PO) Take by mouth. 40 mg   aspirin EC 81 MG tablet Take 81 mg by mouth daily. Swallow whole.   cetirizine (ZYRTEC) 10 MG tablet Take 10 mg by mouth daily.   Cholecalciferol (VITAMIN D3 PO) Take by mouth. 1000 units 2 times daily   clobetasol (TEMOVATE) 0.05 % external solution Apply 1 Application topically 2 (two) times daily as needed.   clobetasol  ointment (TEMOVATE) 0.05 % Apply 1 Application topically 2 (two) times daily as needed.   Clobetasol Propionate 0.05 % shampoo Use three times weekly as needed   Coenzyme Q10 (COQ10) 100 MG CAPS Take by mouth.   ezetimibe (ZETIA) 10 MG tablet Take 1 tablet (10 mg total) by mouth daily.   Folic Acid (FOLATE PO) Take by mouth. 400 mg   lisinopril (ZESTRIL) 40 MG tablet Take 1 tablet (40 mg total) by mouth daily.   MAGNESIUM PO Take 425 mg by mouth 2 (two) times daily.   meloxicam (MOBIC) 15 MG tablet Take 1 tablet (15 mg total) by mouth daily.   Omega-3 Fatty Acids (OMEGA 3 PO) Take by mouth. 1280 mg   omeprazole (PRILOSEC) 10 MG capsule TAKE 1 CAPSULE(10 MG) BY MOUTH DAILY   Probiotic Product (PROBIOTIC DAILY PO) Take 1 capsule by mouth in the morning, at noon, and at bedtime.   rosuvastatin (CRESTOR) 10 MG tablet TAKE 1 TABLET(10 MG) BY MOUTH DAILY   Sodium Hyaluronate, oral, (HYALURONIC ACID PO) Take by mouth. 125 mg plus Vitamin C 12.5 x 2   thiamine (VITAMIN B-1) 100 MG tablet Take 100 mg by mouth daily.   triamcinolone cream (KENALOG) 0.1 % Apply 1 Application topically 2 (two) times daily as needed.   venlafaxine XR (EFFEXOR-XR) 150 MG 24 hr capsule TAKE 1 CAPSULE(150 MG) BY MOUTH DAILY   vitamin B-12 (CYANOCOBALAMIN) 1000 MCG tablet Take 1,000 mcg by mouth daily.    Allergies:   Patient has no known allergies.   Social History   Socioeconomic History   Marital status: Married    Spouse name: Sherrine Maples   Number of children: 0   Years of education: Not on file   Highest education level: Not on file  Occupational History    Comment:  chemist  Tobacco Use   Smoking status: Never    Passive exposure: Never   Smokeless tobacco: Never  Vaping Use   Vaping status: Never Used  Substance and Sexual Activity   Alcohol use: Yes    Alcohol/week: 14.0 standard drinks of alcohol    Types: 6 Glasses of wine, 8 Standard drinks or equivalent per week    Comment: 2 glasses of wine per night    Drug use: No   Sexual activity: Yes  Other Topics Concern   Not on file  Social History Narrative   She would desire CPR.   Does have a living will.   Social Determinants of Health   Financial Resource Strain: Low Risk  (04/06/2022)   Overall Financial Resource Strain (CARDIA)    Difficulty of Paying Living Expenses: Not hard at all  Food Insecurity: No Food Insecurity (04/06/2022)   Hunger Vital Sign    Worried About Running Out of Food in the Last Year: Never  true    Ran Out of Food in the Last Year: Never true  Transportation Needs: No Transportation Needs (04/06/2022)   PRAPARE - Administrator, Civil Service (Medical): No    Lack of Transportation (Non-Medical): No  Physical Activity: Sufficiently Active (04/06/2022)   Exercise Vital Sign    Days of Exercise per Week: 4 days    Minutes of Exercise per Session: 50 min  Stress: No Stress Concern Present (04/06/2022)   [employer] of Occupational Health - Occupational Stress Questionnaire    Feeling of Stress : Not at all  Social Connections: Moderately Integrated (04/06/2022)   Social Connection and Isolation Panel [NHANES]    Frequency of Communication with Friends and Family: More than three times a week    Frequency of Social Gatherings with Friends and Family: Twice a week    Attends Religious Services: Never    Database administrator or Organizations: Yes    Attends Engineer, structural: More than 4 times per year    Marital Status: Married     Family History:  The patient's family history includes Breast cancer (age of onset: 82) in an other family member; Diabetes in her mother; Heart disease in her brother; Heart failure in her mother; Hypertension in her father and mother; Stroke in her father and maternal grandmother. There is no history of Colon cancer, Colon polyps, Esophageal cancer, Stomach cancer, or Rectal cancer.  ROS:   12-point review of systems is negative unless otherwise noted  in the HPI.   EKGs/Labs/Other Studies Reviewed:    Studies reviewed were summarized above. The additional studies were reviewed today:  Calcium score 09/11/2013: IMPRESSION: Coronary calcium score of 0. This was 0 percentile for age and sex matched control. __________   2D echo 04/04/2013: - Left ventricle: The cavity size was normal. Systolic    function was normal. The estimated ejection fraction was    in the range of 60% to 65%. Wall motion was normal; there    were no regional wall motion abnormalities.  - Aortic valve: Trivial regurgitation.  - Left atrium: The atrium was normal in size.  - Right ventricle: Systolic function was normal.  - Pulmonary arteries: Systolic pressure was within the    normal range.  Impressions:   - Normal study.  __________   Nuclear stress test 03/24/2013: Normal perfusion, normal LV systolic function, normal study.   EKG:  EKG is ordered today.  The EKG ordered today demonstrates NSR, 60 bpm, LVH, no acute ST-T changes  Recent Labs: 01/10/2023: ALT 42; BUN 15; Creatinine, Ser 0.57; Hemoglobin 13.8; Platelets 325.0; Potassium 4.2; Sodium 134; TSH 2.68  Recent Lipid Panel    Component Value Date/Time   CHOL 170 03/31/2022 0729   CHOL 258 (H) 01/28/2015 0852   TRIG 103.0 03/31/2022 0729   HDL 80.30 03/31/2022 0729   HDL 68 01/28/2015 0852   CHOLHDL 2 03/31/2022 0729   VLDL 20.6 03/31/2022 0729   LDLCALC 69 03/31/2022 0729   LDLCALC 154 (H) 01/28/2015 0852   LDLDIRECT 191.0 07/30/2014 1401    PHYSICAL EXAM:    VS:  BP 130/77 (BP Location: Left Arm, Patient Position: Sitting, Cuff Size: Normal)   Pulse 68   Ht 5\' 4"  (1.626 m)   Wt 198 lb 12.8 oz (90.2 kg)   SpO2 97%   BMI 34.12 kg/m   BMI: Body mass index is 34.12 kg/m.  Physical Exam Vitals reviewed.  Constitutional:  Appearance: She is well-developed.  HENT:     Head: Normocephalic and atraumatic.  Eyes:     General:        Right eye: No discharge.        Left  eye: No discharge.  Neck:     Vascular: No JVD.  Cardiovascular:     Rate and Rhythm: Normal rate and regular rhythm.     Heart sounds: Normal heart sounds, S1 normal and S2 normal. Heart sounds not distant. No midsystolic click and no opening snap. No murmur heard.    No friction rub.  Pulmonary:     Effort: Pulmonary effort is normal. No respiratory distress.     Breath sounds: Normal breath sounds. No decreased breath sounds, wheezing, rhonchi or rales.  Chest:     Chest wall: No tenderness.  Abdominal:     General: There is no distension.  Musculoskeletal:     Cervical back: Normal range of motion.  Skin:    General: Skin is warm and dry.     Nails: There is no clubbing.  Neurological:     Mental Status: She is alert and oriented to person, place, and time.  Psychiatric:        Speech: Speech normal.        Behavior: Behavior normal.        Thought Content: Thought content normal.        Judgment: Judgment normal.     Wt Readings from Last 3 Encounters:  04/20/23 198 lb 12.8 oz (90.2 kg)  02/07/23 197 lb (89.4 kg)  02/02/23 198 lb 8 oz (90 kg)     ASSESSMENT & PLAN:   HTN: Blood pressure is well-controlled in the office today.  Continue current medical therapy including amlodipine 10 mg, lisinopril 40 mg, and Toprol-XL 1 mg twice daily.  Continue to limit sodium intake.  Coronary artery calcification/aortic atherosclerosis/HLD: No symptoms suggestive of angina or cardiac decompensation.  LDL 69.  Remains on rosuvastatin 10 mg and ezetimibe.  Hepatic steatosis: Followed by PCP.  Hyponatremia: Most recent sodium 134 in 01/2023.  Avoid excessive sodium intake to minimize fluid retention and elevated BP readings.  Suspect Effexor is contributing to hyponatremia.  Ongoing management per PCP.  Obesity with recently diagnosed OSA: Weight loss is encouraged with heart healthy diet and regular exercise.  Active lifestyle is limited by significant arthritis involving the right  foot.  She is interested in pursuing GLP-1 therapy, no history of medullary thyroid cancer or MEN 2.  Await follow-up labs at PCP's office with plans to initiate therapy thereafter in an effort to reduce CV risk.    Disposition: F/u with Dr. Mariah Milling or an APP in 6 months.   Medication Adjustments/Labs and Tests Ordered: Current medicines are reviewed at length with the patient today.  Concerns regarding medicines are outlined above. Medication changes, Labs and Tests ordered today are summarized above and listed in the Patient Instructions accessible in Encounters.   Signed, Eula Listen, PA-C 04/20/2023 1:17 PM     [facility] [place] [address] [place], [place] [address] [phone]

## 2023-04-20 NOTE — Patient Instructions (Signed)
Medication Instructions:  Your Physician recommend you continue on your current medication as directed.    *If you need a refill on your cardiac medications before your next appointment, please call your pharmacy*   Lab Work: None Ordered  Follow-Up: At [facility], you and your health needs are our priority.  As part of our continuing mission to provide you with exceptional heart care, we have created designated Provider Care Teams.  These Care Teams include your primary Cardiologist (physician) and Advanced Practice Providers (APPs -  Physician Assistants and Nurse Practitioners) who all work together to provide you with the care you need, when you need it.  We recommend signing up for the patient portal called "MyChart".  Sign up information is provided on this After Visit Summary.  MyChart is used to connect with patients for Virtual Visits (Telemedicine).  Patients are able to view lab/test results, encounter notes, upcoming appointments, etc.  Non-urgent messages can be sent to your provider as well.   To learn more about what you can do with MyChart, go to [email or web address].    Your next appointment:   6 month(s)  Provider:   You may see Yvonne Kendall, MD or one of the following Advanced Practice Providers on your designated Care Team:   Eula Listen, [place]

## 2023-04-22 ENCOUNTER — Other Ambulatory Visit: Payer: Self-pay | Admitting: Cardiovascular Disease

## 2023-04-27 ENCOUNTER — Encounter: Payer: Self-pay | Admitting: Cardiovascular Disease

## 2023-04-27 ENCOUNTER — Ambulatory Visit: Payer: Medicare HMO | Attending: Cardiovascular Disease | Admitting: Cardiovascular Disease

## 2023-04-27 VITALS — BP 150/70 | HR 74 | Ht 64.0 in | Wt 199.1 lb

## 2023-04-27 DIAGNOSIS — I700 Atherosclerosis of aorta: Secondary | ICD-10-CM

## 2023-04-27 DIAGNOSIS — I10 Essential (primary) hypertension: Secondary | ICD-10-CM | POA: Diagnosis not present

## 2023-04-27 DIAGNOSIS — I2510 Atherosclerotic heart disease of native coronary artery without angina pectoris: Secondary | ICD-10-CM

## 2023-04-27 MED ORDER — DOXAZOSIN MESYLATE 1 MG PO TABS: 1.0000 mg | ORAL_TABLET | Freq: Every day | ORAL | 3 refills | Status: DC | PRN

## 2023-04-27 NOTE — Patient Instructions (Addendum)
Medication Instructions:  Cardura/doxazosin 1 mg daily as needed for pressure >160  If you need a refill on your cardiac medications before your next appointment, please call your pharmacy.   Lab work: No new labs needed  Testing/Procedures: No new testing needed  Follow-Up: At [facility], you and your health needs are our priority.  As part of our continuing mission to provide you with exceptional heart care, we have created designated Provider Care Teams.  These Care Teams include your primary Cardiologist (physician) and Advanced Practice Providers (APPs -  Physician Assistants and Nurse Practitioners) who all work together to provide you with the care you need, when you need it.  You will need a follow up appointment in 12 months  Providers on your designated Care Team:   Nicolasa Ducking, NP Eula Listen, PA-C Cadence Fransico Michael, [place]  COVID-19 Vaccine Information can be found at: [email or web address] For questions related to vaccine distribution or appointments, please email [email or web address] or call [phone].

## 2023-04-27 NOTE — Progress Notes (Signed)
Date:  04/27/2023 home ID:  Christy Freeman, Christy Freeman 12-08-1950, MRN [id number]  Patient Location:  [address] [facility] [address]   Provider location:   [facility] [place] office  PCP:  Excell Seltzer, MD  Cardiologist:  Fonnie Mu   Chief Complaint  Patient presents with   12 month follow up     Patient saw Eula Listen, PA on 04/20/2023 due to elevated blood pressure. Patient stated, since the change in the Metoprolol, her blood pressure is doing much better. Medications reviewed by the patient verbally.     History of Present Illness:    Christy Freeman is a 72 y.o. female past medical history of fatty liver   LDL  190-200 mg/dL,   Previous evaluation in the ER following chest pains, which were ultimately felt to be due to noncardiac chest pain, poorly controlled blood pressure,    Stress test negative.  aortic atherosclerosis , mild coronary calcification on prior CT scan  She presents for evaluation of hyperlipidemia, aortic atherosclerosis  Last clinic visit with myself November 2023 Seen by one of our providers November 2024  For elevated blood pressure taking lisinopril 40 amlodipine 10 metoprolol succinate 100 Possible untreated sleep apnea, high sodium intake Mild sleep apnea on sleep study  In follow-up she reports blood pressure running 135 up to 140 Take 3 of her medications in the morning, additional metoprolol in the evening  Problems with her foot, swelling, tendinitis, has not been exercising, weight has been trending higher Bought a sit down pedal bike for home  Has trace lower extremity edema Thinking about compression hose  Lab work reviewed Lipids, LDL 69 Total chol 170  Does periodic lower extremity venous Dopplers    Right:  - Venous reflux is noted in the right sapheno-femoral junction.  Left:  - Venous reflux is noted in the left common femoral vein.  - Venous reflux is noted in the left sapheno-femoral  junction.   CT ABD 03/2019 reviewed 1. Mildly enlarged and steatotic liver. 2. Cholelithiasis. 3.  Aortic atherosclerosis   CT scan chest showed no coronary calcifications, minimal descending aorta calcification, none in the proximal carotid arteries.   Family history:  Mother (MI at 10 y.o. ; Diabetes at age 44 y.o.).  Father (TIA at 30 y.o.);  Brother (MI at 48 y.o.) Social History:  Drinks wine 4-6 glasses per week  Previously drank 4 nights per week.  She stopped 2009-2011 after seeing fatty liver results, but restarted in 2011 after stress levels increased.   No tobacco use.     Liver history:  Patient diagnosed with fatty liver on U/S 06/2007 by Dr. Demetrio Lapping [facility], [place]).  Her LFTs went all the way to normal she tells me once she stopped drinking alcohol in 2009 - then went up after restating alcohol.  He checks LFTs annually.  ALT/AST were in the 90's in 03/06/13, then improved to 40's late 03/24/13 after she reduced her alcohol consumption from 4 days per week down to 2 nights per week (2-3 glasses per day).       Past Medical History:  Diagnosis Date   Allergy    seasonal allergies   Anxiety    hx- on meds   Arthritis    Right hand   Blood transfusion without reported diagnosis 1989   Cancer (HCC)    basal cell on face   Cataract    Colon polyps  GERD (gastroesophageal reflux disease)    H/O: depression    Hypercholesterolemia    diet controlled   Hypertension    Lung nodule    left lung    Psoriasis    Past Surgical History:  Procedure Laterality Date   APPENDECTOMY  06/14/2017   BREAST BIOPSY Right 2000   benign   COLONOSCOPY  2016   TA x 1   CYST REMOVAL HAND Left    DILATION AND CURETTAGE OF UTERUS     LAPAROSCOPIC APPENDECTOMY N/A 06/14/2017   Procedure: APPENDECTOMY LAPAROSCOPIC;  Surgeon: Ricarda Frame, MD;  Location: [facility] ORS;  Service: General;  Laterality: N/A;   POLYPECTOMY  2016   TA     Current Meds  Medication Sig   Alpha-Lipoic Acid 200 MG  CAPS Take by mouth.   ALPRAZolam (XANAX) 0.25 MG tablet Take 1 tablet (0.25 mg total) by mouth daily as needed for anxiety.   amLODipine (NORVASC) 10 MG tablet TAKE 1 TABLET(10 MG) BY MOUTH DAILY   Ascorbic Acid (VITAMIN C PO) Take by mouth. 40 mg   aspirin EC 81 MG tablet Take 81 mg by mouth daily. Swallow whole.   cetirizine (ZYRTEC) 10 MG tablet Take 10 mg by mouth daily.   Cholecalciferol (VITAMIN D3 PO) Take by mouth. 1000 units 2 times daily   clobetasol ointment (TEMOVATE) 0.05 % Apply 1 Application topically 2 (two) times daily as needed.   Clobetasol Propionate 0.05 % shampoo Use three times weekly as needed   Coenzyme Q10 (COQ10) 100 MG CAPS Take by mouth.   ezetimibe (ZETIA) 10 MG tablet TAKE 1 TABLET(10 MG) BY MOUTH DAILY   Folic Acid (FOLATE PO) Take by mouth. 400 mg   lisinopril (ZESTRIL) 40 MG tablet Take 1 tablet (40 mg total) by mouth daily.   MAGNESIUM PO Take 425 mg by mouth 2 (two) times daily.   meloxicam (MOBIC) 15 MG tablet Take 1 tablet (15 mg total) by mouth daily.   metoprolol succinate (TOPROL-XL) 100 MG 24 hr tablet Take 1 tablet (100 mg total) by mouth 2 (two) times daily.   Omega-3 Fatty Acids (OMEGA 3 PO) Take by mouth. 1280 mg   omeprazole (PRILOSEC) 10 MG capsule TAKE 1 CAPSULE(10 MG) BY MOUTH DAILY   Probiotic Product (PROBIOTIC DAILY PO) Take 1 capsule by mouth in the morning, at noon, and at bedtime.   rosuvastatin (CRESTOR) 10 MG tablet TAKE 1 TABLET(10 MG) BY MOUTH DAILY   Sodium Hyaluronate, oral, (HYALURONIC ACID PO) Take by mouth. 125 mg plus Vitamin C 12.5 x 2   thiamine (VITAMIN B-1) 100 MG tablet Take 100 mg by mouth daily.   triamcinolone cream (KENALOG) 0.1 % Apply 1 Application topically 2 (two) times daily as needed.   venlafaxine XR (EFFEXOR-XR) 150 MG 24 hr capsule TAKE 1 CAPSULE(150 MG) BY MOUTH DAILY   vitamin B-12 (CYANOCOBALAMIN) 1000 MCG tablet Take 1,000 mcg by mouth daily.     Allergies:   Patient has no known allergies.   Social  History   Tobacco Use   Smoking status: Never    Passive exposure: Never   Smokeless tobacco: Never  Vaping Use   Vaping status: Never Used  Substance Use Topics   Alcohol use: Yes    Alcohol/week: 14.0 standard drinks of alcohol    Types: 6 Glasses of wine, 8 Standard drinks or equivalent per week    Comment: 2 glasses of wine per night   Drug use: No  Family Hx: The patient's family history includes Breast cancer (age of onset: 83) in an other family member; Diabetes in her mother; Heart disease in her brother; Heart failure in her mother; Hypertension in her father and mother; Stroke in her father and maternal grandmother. There is no history of Colon cancer, Colon polyps, Esophageal cancer, Stomach cancer, or Rectal cancer.  ROS:   Please see the history of present illness.    Review of Systems  Constitutional: Negative.   HENT: Negative.    Respiratory: Negative.    Cardiovascular: Negative.   Gastrointestinal: Negative.   Musculoskeletal: Negative.   Neurological: Negative.   Psychiatric/Behavioral: Negative.    All other systems reviewed and are negative.    Labs/Other Tests and Data Reviewed:    Recent Labs: 01/10/2023: ALT 42; BUN 15; Creatinine, Ser 0.57; Hemoglobin 13.8; Platelets 325.0; Potassium 4.2; Sodium 134; TSH 2.68   Recent Lipid Panel Lab Results  Component Value Date/Time   CHOL 170 03/31/2022 07:29 AM   CHOL 258 (H) 01/28/2015 08:52 AM   TRIG 103.0 03/31/2022 07:29 AM   HDL 80.30 03/31/2022 07:29 AM   HDL 68 01/28/2015 08:52 AM   CHOLHDL 2 03/31/2022 07:29 AM   LDLCALC 69 03/31/2022 07:29 AM   LDLCALC 154 (H) 01/28/2015 08:52 AM   LDLDIRECT 191.0 07/30/2014 02:01 PM    Wt Readings from Last 3 Encounters:  04/27/23 199 lb 2 oz (90.3 kg)  04/20/23 198 lb 12.8 oz (90.2 kg)  02/07/23 197 lb (89.4 kg)     Exam:    Vital Signs: Vital signs may also be detailed in the HPI BP (!) 150/70 (BP Location: Left Arm, Patient Position: Sitting, Cuff  Size: Normal)   Pulse 74   Ht 5\' 4"  (1.626 m)   Wt 199 lb 2 oz (90.3 kg)   SpO2 97%   BMI 34.18 kg/m   Constitutional:  oriented to person, place, and time. No distress.  HENT:  Head: Grossly normal Eyes:  no discharge. No scleral icterus.  Neck: No JVD, no carotid bruits  Cardiovascular: Regular rate and rhythm, no murmurs appreciated Pulmonary/Chest: Clear to auscultation bilaterally, no wheezes or rails Abdominal: Soft.  no distension.  no tenderness.  Musculoskeletal: Normal range of motion Neurological:  normal muscle tone. Coordination normal. No atrophy Skin: Skin warm and dry Psychiatric: normal affect, pleasant  ASSESSMENT & PLAN:    Aortic atherosclerosis (HCC) Mild to moderate distal aortic atherosclerosis  On crestor/zetia LDL at goal  PACs Previously noted on EKG in 2023, None on subsequent EKGs Tolerating high-dose metoprolol succinate twice a day Palpitations at nighttime have resolved  Essential hypertension Weight trending up over the past year Blood pressure high end of normal range Will continue amlodipine 10, lisinopril 40, metoprolol succinate 100 twice daily for now Prescription provided for Cardura 1 mg that she can take for pressures over 160s Will try to avoid HCTZ/Lasix given hyponatremia  Hyponatremia Chronically borderline low, off HCTZ  Gastroesophageal reflux disease with esophagitis Stable symptoms  Hyperlipidemia, Cholesterol is at goal on the current lipid regimen. No changes to the medications were made.  Obesity  low carbohydrates recommended  Fatty liver/NASH Low grade mildly elevated numbers Low carbohydrate diet recommended Interested in GLP-1 medication A1c through primary care  Leg swelling Recommend compression hose Weight trending higher may contribute Amlodipine may also contribute     Signed, Julien Nordmann, MD  04/27/2023 10:46 AM    [facility] [id number], [place], [place] [address]

## 2023-04-28 DIAGNOSIS — G4733 Obstructive sleep apnea (adult) (pediatric): Secondary | ICD-10-CM | POA: Diagnosis not present

## 2023-04-30 ENCOUNTER — Other Ambulatory Visit: Payer: Self-pay | Admitting: Emergency Medicine

## 2023-04-30 ENCOUNTER — Other Ambulatory Visit: Payer: Self-pay | Admitting: Physician Assistant

## 2023-04-30 DIAGNOSIS — R030 Elevated blood-pressure reading, without diagnosis of hypertension: Secondary | ICD-10-CM

## 2023-04-30 DIAGNOSIS — I10 Essential (primary) hypertension: Secondary | ICD-10-CM

## 2023-05-01 ENCOUNTER — Other Ambulatory Visit: Payer: Self-pay | Admitting: Cardiovascular Disease

## 2023-05-01 ENCOUNTER — Ambulatory Visit: Payer: Medicare HMO

## 2023-05-01 DIAGNOSIS — Z872 Personal history of diseases of the skin and subcutaneous tissue: Secondary | ICD-10-CM | POA: Diagnosis not present

## 2023-05-01 DIAGNOSIS — Z85828 Personal history of other malignant neoplasm of skin: Secondary | ICD-10-CM | POA: Diagnosis not present

## 2023-05-01 DIAGNOSIS — L2089 Other atopic dermatitis: Secondary | ICD-10-CM | POA: Diagnosis not present

## 2023-05-01 DIAGNOSIS — D485 Neoplasm of uncertain behavior of skin: Secondary | ICD-10-CM | POA: Diagnosis not present

## 2023-05-01 DIAGNOSIS — D2361 Other benign neoplasm of skin of right upper limb, including shoulder: Secondary | ICD-10-CM | POA: Diagnosis not present

## 2023-05-01 DIAGNOSIS — L578 Other skin changes due to chronic exposure to nonionizing radiation: Secondary | ICD-10-CM | POA: Diagnosis not present

## 2023-05-09 ENCOUNTER — Ambulatory Visit: Payer: Medicare HMO

## 2023-05-09 VITALS — Ht 64.0 in | Wt 199.0 lb

## 2023-05-09 DIAGNOSIS — Z0000 Encounter for general adult medical examination without abnormal findings: Secondary | ICD-10-CM

## 2023-05-09 NOTE — Progress Notes (Signed)
Subjective:   Christy Freeman is a 72 y.o. female who presents for Medicare Annual (Subsequent) preventive examination.  Visit Complete: Virtual I connected with  Christy Freeman on 05/09/23 by a audio enabled telemedicine application and verified that I am speaking with the correct person using two identifiers.  Patient Location: Home  Provider Location: Office/Clinic  I discussed the limitations of evaluation and management by telemedicine. The patient expressed understanding and agreed to proceed.  Vital Signs: Because this visit was a virtual/telehealth visit, some criteria may be missing or patient reported. Any vitals not documented were not able to be obtained and vitals that have been documented are patient reported.  Patient Medicare AWV questionnaire was completed by the patient on (not done); I have confirmed that all information answered by patient is correct and no changes since this date.  Cardiac Risk Factors include: advanced age (>36men, >44 women);dyslipidemia;hypertension;obesity (BMI >30kg/m2);sedentary lifestyle    Objective:    Today's Vitals   05/09/23 1407  Weight: 199 lb (90.3 kg)  Height: 5\' 4"  (1.626 m)  PainSc: 6    Body mass index is 34.16 kg/m.     05/09/2023    2:24 PM 04/06/2022    9:05 AM 11/27/2020    2:45 PM 10/09/2018    9:20 AM 10/29/2017    9:56 AM 08/15/2017    2:26 PM 06/14/2017    4:06 PM  Advanced Directives  Does Patient Have a Medical Advance Directive? Yes Yes No Yes No Yes   Type of Estate agent of [place];Living will Healthcare Power of [employer] of [place];Living will  Healthcare Power of [place];Living will   Does patient want to make changes to medical advance directive?    No - Patient declined  No - Patient declined   Copy of Healthcare Power of Attorney in Chart? Yes - validated most recent copy scanned in chart (See row information) Yes - validated most recent copy scanned in  chart (See row information)  Yes - validated most recent copy scanned in chart (See row information)  No - copy requested   Would patient like information on creating a medical advance directive?     No - Patient declined  No - Patient declined    Current Medications (verified) Outpatient Encounter Medications as of 05/09/2023  Medication Sig   Alpha-Lipoic Acid 200 MG CAPS Take by mouth.   ALPRAZolam (XANAX) 0.25 MG tablet Take 1 tablet (0.25 mg total) by mouth daily as needed for anxiety.   amLODipine (NORVASC) 10 MG tablet TAKE 1 TABLET(10 MG) BY MOUTH DAILY   Ascorbic Acid (VITAMIN C PO) Take by mouth. 40 mg   aspirin EC 81 MG tablet Take 81 mg by mouth daily. Swallow whole.   cetirizine (ZYRTEC) 10 MG tablet Take 10 mg by mouth daily.   Cholecalciferol (VITAMIN D3 PO) Take by mouth. 1000 units 2 times daily   clobetasol (TEMOVATE) 0.05 % external solution Apply 1 Application topically 2 (two) times daily as needed.   clobetasol ointment (TEMOVATE) 0.05 % Apply 1 Application topically 2 (two) times daily as needed.   Clobetasol Propionate 0.05 % shampoo Use three times weekly as needed   Coenzyme Q10 (COQ10) 100 MG CAPS Take by mouth.   doxazosin (CARDURA) 1 MG tablet Take 1 tablet (1 mg total) by mouth daily as needed. Take for blood pressure greater then 160.   ezetimibe (ZETIA) 10 MG tablet TAKE 1 TABLET(10 MG) BY MOUTH DAILY  Folic Acid (FOLATE PO) Take by mouth. 400 mg   lisinopril (ZESTRIL) 40 MG tablet Take 1 tablet (40 mg total) by mouth daily.   MAGNESIUM PO Take 425 mg by mouth 2 (two) times daily.   Omega-3 Fatty Acids (OMEGA 3 PO) Take by mouth. 1280 mg   omeprazole (PRILOSEC) 10 MG capsule TAKE 1 CAPSULE(10 MG) BY MOUTH DAILY   Probiotic Product (PROBIOTIC DAILY PO) Take 1 capsule by mouth in the morning, at noon, and at bedtime.   rosuvastatin (CRESTOR) 10 MG tablet TAKE 1 TABLET(10 MG) BY MOUTH DAILY   Sodium Hyaluronate, oral, (HYALURONIC ACID PO) Take by mouth. 125 mg  plus Vitamin C 12.5 x 2   thiamine (VITAMIN B-1) 100 MG tablet Take 100 mg by mouth daily.   traZODone (DESYREL) 50 MG tablet Take 1 tablet (50 mg total) by mouth at bedtime as needed for sleep.   triamcinolone cream (KENALOG) 0.1 % Apply 1 Application topically 2 (two) times daily as needed.   venlafaxine XR (EFFEXOR-XR) 150 MG 24 hr capsule TAKE 1 CAPSULE(150 MG) BY MOUTH DAILY   vitamin B-12 (CYANOCOBALAMIN) 1000 MCG tablet Take 1,000 mcg by mouth daily.   meloxicam (MOBIC) 15 MG tablet Take 1 tablet (15 mg total) by mouth daily. (Patient not taking: Reported on 05/09/2023)   metoprolol succinate (TOPROL-XL) 100 MG 24 hr tablet Take 1 tablet (100 mg total) by mouth 2 (two) times daily.   No facility-administered encounter medications on file as of 05/09/2023.    Allergies (verified) Patient has no known allergies.   History: Past Medical History:  Diagnosis Date   Allergy    seasonal allergies   Anxiety    hx- on meds   Arthritis    Right hand   Blood transfusion without reported diagnosis 1989   Cancer (HCC)    basal cell on face   Cataract    Colon polyps    GERD (gastroesophageal reflux disease)    H/O: depression    Hypercholesterolemia    diet controlled   Hypertension    Lung nodule    left lung    Psoriasis    Past Surgical History:  Procedure Laterality Date   APPENDECTOMY  06/14/2017   BREAST BIOPSY Right 2000   benign   COLONOSCOPY  2016   TA x 1   CYST REMOVAL HAND Left    DILATION AND CURETTAGE OF UTERUS     LAPAROSCOPIC APPENDECTOMY N/A 06/14/2017   Procedure: APPENDECTOMY LAPAROSCOPIC;  Surgeon: Ricarda Frame, MD;  Location: [facility] ORS;  Service: General;  Laterality: N/A;   POLYPECTOMY  2016   TA   Family History  Problem Relation Age of Onset   Diabetes Mother    Heart failure Mother    Hypertension Mother    Stroke Father    Hypertension Father    Heart disease Brother    Stroke Maternal Grandmother    Breast cancer Other 35       neice    Colon cancer Neg Hx    Colon polyps Neg Hx    Esophageal cancer Neg Hx    Stomach cancer Neg Hx    Rectal cancer Neg Hx    Social History   Socioeconomic History   Marital status: Married    Spouse name: Christy Freeman   Number of children: 0   Years of education: Not on file   Highest education level: Not on file  Occupational History    Comment:  chemist  Tobacco Use  Smoking status: Never    Passive exposure: Never   Smokeless tobacco: Never  Vaping Use   Vaping status: Never Used  Substance and Sexual Activity   Alcohol use: Yes    Alcohol/week: 14.0 standard drinks of alcohol    Types: 6 Glasses of wine, 8 Standard drinks or equivalent per week    Comment: 2 glasses of wine per night   Drug use: No   Sexual activity: Yes  Other Topics Concern   Not on file  Social History Narrative   She would desire CPR.   Does have a living will.   Social Determinants of Health   Financial Resource Strain: Low Risk  (05/09/2023)   Overall Financial Resource Strain (CARDIA)    Difficulty of Paying Living Expenses: Not hard at all  Food Insecurity: No Food Insecurity (05/09/2023)   Hunger Vital Sign    Worried About Running Out of Food in the Last Year: Never true    Ran Out of Food in the Last Year: Never true  Transportation Needs: No Transportation Needs (05/09/2023)   PRAPARE - Administrator, Civil Service (Medical): No    Lack of Transportation (Non-Medical): No  Physical Activity: Insufficiently Active (05/09/2023)   Exercise Vital Sign    Days of Exercise per Week: 3 days    Minutes of Exercise per Session: 20 min  Stress: No Stress Concern Present (05/09/2023)   [employer] of Occupational Health - Occupational Stress Questionnaire    Feeling of Stress : Not at all  Social Connections: Moderately Integrated (05/09/2023)   Social Connection and Isolation Panel [NHANES]    Frequency of Communication with Friends and Family: More than three times a week     Frequency of Social Gatherings with Friends and Family: Twice a week    Attends Religious Services: Never    Database administrator or Organizations: Yes    Attends Engineer, structural: More than 4 times per year    Marital Status: Married    Tobacco Counseling Counseling given: Not Answered   Clinical Intake:  Pre-visit preparation completed: No  Pain : 0-10 Pain Score: 6  Pain Type: Chronic pain (arthritis) Pain Location: Foot (both) Pain Descriptors / Indicators: Aching Pain Onset: More than a month ago Pain Frequency: Constant Pain Relieving Factors: Alieve, better shoes, CBD cream  Pain Relieving Factors: Alieve, better shoes, CBD cream  BMI - recorded: 34.16 Nutritional Status: BMI > 30  Obese Nutritional Risks: None Diabetes: No  How often do you need to have someone help you when you read instructions, pamphlets, or other written materials from your doctor or pharmacy?: 1 - Never  Interpreter Needed?: No  Comments: live with husband Information entered by :: B.Dewanda Fennema,LPN   Activities of Daily Living    05/09/2023    2:24 PM  In your present state of health, do you have any difficulty performing the following activities:  Hearing? 1  Vision? 0  Difficulty concentrating or making decisions? 0  Walking or climbing stairs? 0  Dressing or bathing? 0  Doing errands, shopping? 0  Preparing Food and eating ? N  Using the Toilet? N  In the past six months, have you accidently leaked urine? N  Do you have problems with loss of bowel control? N  Managing your Medications? N  Managing your Finances? N  Housekeeping or managing your Housekeeping? N    Patient Care Team: Excell Seltzer, MD as PCP - General (Family Medicine)  Antonieta Iba, MD as PCP - Cardiology (Cardiology) Antonieta Iba, MD as Consulting Physician (Cardiology) Meryl Dare, MD as Consulting Physician (Gastroenterology) [place] Crane, MD as Consulting Physician  (Ophthalmology) Oretha Milch, MD as Consulting Physician (Pulmonary Disease)  Indicate any recent Medical Services you may have received from other than [facility] providers in the past year (date may be approximate).     Assessment:   This is a routine wellness examination for [place].  Hearing/Vision screen Hearing Screening - Comments:: Pt says her hearing is good with hearing aids Vision Screening - Comments:: Pt says her vision is good with glasses Dr Brooke Dare   Goals Addressed             This Visit's Progress    Increase physical activity   Not on track    Starting 08/03/2016, I will continue to exercise at least 30 min 5 days per week.      COMPLETED: LDL CALC < 130       < 130 at least, and would prefer LDL < 100 if possible given family history     Patient Stated   On track    Reducing triglycerides       Depression Screen    05/09/2023    2:20 PM 01/10/2023    8:55 AM 11/14/2022   11:27 AM 04/06/2022    9:14 AM 04/05/2021    9:52 AM 09/16/2020    9:40 AM 04/02/2020   11:04 AM  PHQ 2/9 Scores  PHQ - 2 Score 0 0 2 0 0 3 1  PHQ- 9 Score  4 10 2 1 7 2     Fall Risk    05/09/2023    2:15 PM 01/10/2023    8:55 AM 11/14/2022   10:47 AM 04/06/2022    9:06 AM 07/11/2021    9:14 AM  Fall Risk   Falls in the past year? 0 0 0 0 0  Number falls in past yr: 0 0 0 0 0  Injury with Fall? 0 0 0 0   Risk for fall due to : Orthopedic patient No Fall Risks No Fall Risks    Follow up Falls prevention discussed;Education provided Falls evaluation completed Falls evaluation completed Falls evaluation completed;Education provided     MEDICARE RISK AT HOME: Medicare Risk at Home Any stairs in or around the home?: Yes If so, are there any without handrails?: Yes Home free of loose throw rugs in walkways, pet beds, electrical cords, etc?: Yes Adequate lighting in your home to reduce risk of falls?: Yes Life alert?: No Use of a cane, walker or w/c?: No Grab bars in the bathroom?:  No Shower chair or bench in shower?: No Elevated toilet seat or a handicapped toilet?: Yes  TIMED UP AND GO:  Was the test performed?  No    Cognitive Function:    08/03/2016    8:10 AM  MMSE - Mini Mental State Exam  Orientation to time 5  Orientation to Place 5  Registration 3  Attention/ Calculation 0  Recall 3  Language- name 2 objects 0  Language- repeat 1  Language- follow 3 step command 3  Language- read & follow direction 0  Write a sentence 0  Copy design 0  Total score 20        05/09/2023    2:26 PM 04/06/2022    9:07 AM  6CIT Screen  What Year? 0 points 0 points  What month? 0 points 0  points  What time? 0 points 0 points  Count back from 20 0 points 0 points  Months in reverse 0 points 0 points  Repeat phrase 0 points 0 points  Total Score 0 points 0 points    Immunizations Immunization History  Administered Date(s) Administered   Fluad Quad(high Dose 65+) 04/02/2020, 04/05/2021, 04/04/2022   Influenza Split 04/15/2013   Influenza, Seasonal, Injecte, Preservative Fre 07/03/2016   Influenza,inj,Quad PF,6+ Mos 05/12/2014   Influenza-Unspecified 04/06/2017, 05/23/2018, 02/21/2019   PFIZER SARS-COV-2 Pediatric Vaccination 5-67yrs 03/17/2022   PFIZER(Purple Top)SARS-COV-2 Vaccination 07/10/2019, 07/31/2019, 02/29/2020, 10/14/2020   Pfizer Covid-19 Vaccine Bivalent Booster 70yrs & up 03/24/2021, 04/18/2023   Pneumococcal Conjugate-13 08/03/2015   Pneumococcal Polysaccharide-23 03/31/2013, 03/26/2019   Td 08/08/2012   Zoster Recombinant(Shingrix) 03/15/2017, 06/27/2018   Zoster, Live 05/12/2012    TDAP status: Up to date  Flu Vaccine status: Up to date  Pneumococcal vaccine status: Up to date  Covid-19 vaccine status: Completed vaccines  Qualifies for Shingles Vaccine? Yes   Zostavax completed Yes   Shingrix Completed?: Yes  Screening Tests Health Maintenance  Topic Date Due   INFLUENZA VACCINE  09/03/2023 (Originally 01/04/2023)    DTaP/Tdap/Td (2 - Tdap) 05/08/2024 (Originally 08/09/2022)   COVID-19 Vaccine (7 - 2023-24 season) 06/13/2023   MAMMOGRAM  05/08/2024   Medicare Annual Wellness (AWV)  05/08/2024   Pneumonia Vaccine 47+ Years old  Completed   DEXA SCAN  Completed   Hepatitis C Screening  Completed   Zoster Vaccines- Shingrix  Completed   HPV VACCINES  Aged Out   Colonoscopy  Discontinued    Health Maintenance  There are no preventive care reminders to display for this patient.   Colorectal cancer screening: Type of screening: Colonoscopy. Completed 02/07/2023. Repeat every no longer will receive years  Mammogram status: Completed 05/08/22. Repeat every year Appt for 05/11/23  Bone Density status: Completed 06/26/22. Results reflect: Bone density results: NORMAL. Repeat every 5 years.  Lung Cancer Screening: (Low Dose CT Chest recommended if Age 62-80 years, 20 pack-year currently smoking OR have quit w/in 15years.) does not qualify.   Lung Cancer Screening Referral: no  Additional Screening:  Hepatitis C Screening: does not qualify; Completed 08/03/2015  Vision Screening: Recommended annual ophthalmology exams for early detection of glaucoma and other disorders of the eye. Is the patient up to date with their annual eye exam?  Yes  Who is the provider or what is the name of the office in which the patient attends annual eye exams? Dr Willey Blade If pt is not established with a provider, would they like to be referred to a provider to establish care? No .   Dental Screening: Recommended annual dental exams for proper oral hygiene  Diabetic Foot Exam: n/a  Community Resource Referral / Chronic Care Management: CRR required this visit?  No   CCM required this visit?  No    Plan:     I have personally reviewed and noted the following in the patient's chart:   Medical and social history Use of alcohol, tobacco or illicit drugs  Current medications and supplements including opioid  prescriptions. Patient is not currently taking opioid prescriptions. Functional ability and status Nutritional status Physical activity Advanced directives List of other physicians Hospitalizations, surgeries, and ER visits in previous 12 months Vitals Screenings to include cognitive, depression, and falls Referrals and appointments  In addition, I have reviewed and discussed with patient certain preventive protocols, quality metrics, and best practice recommendations. A written personalized care  plan for preventive services as well as general preventive health recommendations were provided to patient.    Sue Lush, LPN   [id number]   After Visit Summary: (MyChart) Due to this being a telephonic visit, the after visit summary with patients personalized plan was offered to patient via MyChart   Nurse Notes: Pt relays she is managing pain due to arthritis in both feet. She relays she no longer walks but does exercises on elliptical sitting due to this. Otherwise she relays she is doing alright. She does express interest in GLP-1 medications to lose weight (wants A1C done). Also desires to know if she needs another RSV vaccine. Pt has appt 05/11/23 and will inquire.

## 2023-05-09 NOTE — Patient Instructions (Addendum)
Christy Freeman , Thank you for taking time to come for your Medicare Wellness Visit. I appreciate your ongoing commitment to your health goals. Please review the following plan we discussed and let me know if I can assist you in the future.   Referrals/Orders/Follow-Ups/Clinician Recommendations: none  This is a list of the screening recommended for you and due dates:  Health Maintenance  Topic Date Due   Flu Shot  09/03/2023*   DTaP/Tdap/Td vaccine (2 - Tdap) 05/08/2024*   COVID-19 Vaccine (7 - 2023-24 season) 06/13/2023   Mammogram  05/08/2024   Medicare Annual Wellness Visit  05/08/2024   Pneumonia Vaccine  Completed   DEXA scan (bone density measurement)  Completed   Hepatitis C Screening  Completed   Zoster (Shingles) Vaccine  Completed   HPV Vaccine  Aged Out   Colon Cancer Screening  Discontinued  *Topic was postponed. The date shown is not the original due date.    Advanced directives: (In Chart) A copy of your advanced directives are scanned into your chart should your provider ever need it.  Next Medicare Annual Wellness Visit scheduled for next year: Yes 05/10/23/5 @ 2:20pm telephone

## 2023-05-11 ENCOUNTER — Ambulatory Visit
Admission: RE | Admit: 2023-05-11 | Discharge: 2023-05-11 | Disposition: A | Discharge status: Home / Self Care | Payer: Medicare HMO | Source: Ambulatory Visit | Attending: Family Medicine | Admitting: Family Medicine

## 2023-05-11 ENCOUNTER — Encounter: Payer: Self-pay | Admitting: Family Medicine

## 2023-05-11 ENCOUNTER — Ambulatory Visit (INDEPENDENT_AMBULATORY_CARE_PROVIDER_SITE_OTHER): Payer: Medicare HMO | Admitting: Family Medicine

## 2023-05-11 VITALS — BP 130/66 | HR 94 | Temp 98.3°F | Ht 64.0 in | Wt 199.1 lb

## 2023-05-11 DIAGNOSIS — F321 Major depressive disorder, single episode, moderate: Secondary | ICD-10-CM

## 2023-05-11 DIAGNOSIS — L400 Psoriasis vulgaris: Secondary | ICD-10-CM | POA: Diagnosis not present

## 2023-05-11 DIAGNOSIS — M816 Localized osteoporosis [Lequesne]: Secondary | ICD-10-CM | POA: Insufficient documentation

## 2023-05-11 DIAGNOSIS — E559 Vitamin D deficiency, unspecified: Secondary | ICD-10-CM | POA: Diagnosis not present

## 2023-05-11 DIAGNOSIS — F411 Generalized anxiety disorder: Secondary | ICD-10-CM | POA: Diagnosis not present

## 2023-05-11 DIAGNOSIS — Z0000 Encounter for general adult medical examination without abnormal findings: Secondary | ICD-10-CM

## 2023-05-11 DIAGNOSIS — M810 Age-related osteoporosis without current pathological fracture: Secondary | ICD-10-CM

## 2023-05-11 DIAGNOSIS — E785 Hyperlipidemia, unspecified: Secondary | ICD-10-CM

## 2023-05-11 DIAGNOSIS — I10 Essential (primary) hypertension: Secondary | ICD-10-CM

## 2023-05-11 DIAGNOSIS — I2510 Atherosclerotic heart disease of native coronary artery without angina pectoris: Secondary | ICD-10-CM

## 2023-05-11 DIAGNOSIS — E7800 Pure hypercholesterolemia, unspecified: Secondary | ICD-10-CM

## 2023-05-11 DIAGNOSIS — G4733 Obstructive sleep apnea (adult) (pediatric): Secondary | ICD-10-CM

## 2023-05-11 DIAGNOSIS — Z1231 Encounter for screening mammogram for malignant neoplasm of breast: Secondary | ICD-10-CM | POA: Diagnosis not present

## 2023-05-11 DIAGNOSIS — Z131 Encounter for screening for diabetes mellitus: Secondary | ICD-10-CM

## 2023-05-11 DIAGNOSIS — F5104 Psychophysiologic insomnia: Secondary | ICD-10-CM | POA: Diagnosis not present

## 2023-05-11 DIAGNOSIS — I700 Atherosclerosis of aorta: Secondary | ICD-10-CM | POA: Diagnosis not present

## 2023-05-11 MED ORDER — ALPRAZOLAM 0.25 MG PO TABS: 0.2500 mg | ORAL_TABLET | Freq: Every day | ORAL | 0 refills | Status: AC | PRN

## 2023-05-11 NOTE — Assessment & Plan Note (Signed)
Chronic  Max lisinopril 40 mg daily, max amlodipine 10 mg daily as well as Toprol XL 100 mg daily. We discussed potentially adding hydrochlorothiazide to her medication regimen to lower blood pressure further.  She is hesitant given her history of hyponatremia.  I do think this is still an option given it has been normal the last 3 years.   I will forward my note to Dr. Mariah Milling cardiology for additional recommendations per patient request.

## 2023-05-11 NOTE — Assessment & Plan Note (Signed)
Acute,  well controlled.. has only used 4  tablets in last year.  Will provide Xanax to use as needed on a limited basis giving recent additional stressors.  We did discuss in detail addictive nature of benzodiazepines and increased risk of use after age 72.  She understands risks and will limit use.

## 2023-05-11 NOTE — Assessment & Plan Note (Addendum)
On statin. Followed by cardiology

## 2023-05-11 NOTE — Assessment & Plan Note (Signed)
Chronic, stable  venlafaxine XL 150 mg p.o. daily.   Occasionally using alprazolam as needed.  Trazodone  50 mg po QhS prn insomnia  Seeing counselor.

## 2023-05-11 NOTE — Assessment & Plan Note (Signed)
Osteoporosis seen in forearm on January 2024 DEXA.  Spine and hip are in osteopenia range. Recommend weight bearing exercise, calcium in diet and vit D supplement 400 IU 1-2 times daily. She would like to hold off on medication to treat given primarily in the forearm and not major areas.  Will reevaluate in January 2026.

## 2023-05-11 NOTE — Assessment & Plan Note (Signed)
Patient is a good candidate for GLP-1 medication to decrease CVD risk given current coronary artery calcification seen on CT chest.

## 2023-05-11 NOTE — Patient Instructions (Signed)
Work on heart healthy diet, regular exercise.. weight bearing able. Recommend weight bearing exercise, calcium in diet and vit D supplement 400 IU 1-2 times daily.

## 2023-05-11 NOTE — Assessment & Plan Note (Signed)
Due for reevaluation 

## 2023-05-11 NOTE — Assessment & Plan Note (Signed)
Feeling much better on CPAP.  Dr. Vassie Loll.

## 2023-05-11 NOTE — Progress Notes (Signed)
Patient ID: Darinka Marcello, female    DOB: 09-03-1950, 72 y.o.   MRN: [id number]  This visit was conducted in person.  BP 130/66 (BP Location: Left Arm, Patient Position: Sitting, Cuff Size: Large)   Pulse 94   Temp 98.3 F (36.8 C) (Temporal)   Ht 5\' 4"  (1.626 m)   Wt 199 lb 2 oz (90.3 kg)   SpO2 98%   BMI 34.18 kg/m    CC: Chief Complaint  Patient presents with   Annual Exam    Part 2 (MWV 05/09/23)    Subjective:   HPI: Chasmine Neef is a 72 y.o. female presenting on 05/11/2023 for Annual Exam (Part 2 (MWV 05/09/23))  The patient presents for complete physical and review of chronic health problems. He/She also has the following acute concerns today:   OA feet, followed by Dr. Clayburn Pert.. using naproxyn.  Has had steroid injections.   Not able to exercise.Marland Kitchen gaining weight.  The patient saw a LPN or RN for medicare wellness visit.  May 09, 2023  Prevention and wellness was reviewed in detail. Note reviewed and important notes copied below.   Aortic atherosclerosis  followed by Dr. Mariah Milling  On crestor/zetia.  Due for re-eval lipids.   Fatty liver/NASH: re-eval with liver function tests.   GERD with esophagitis: stable on prilosec   MDD: stable control on venlafaxine.  Caregiver stress:She has been under an extreme amount of caregiving stress with her husband possible dementia diagnosis.  Wt Readings from Last 3 Encounters:  05/11/23 199 lb 2 oz (90.3 kg)  05/09/23 199 lb (90.3 kg)  04/27/23 199 lb 2 oz (90.3 kg)  Body mass index is 34.18 kg/m.   She has been working on weight loss with diet changes but  continuing to gain weight.  Has history of aortic atherosclerosis, Mild coronary artery calcification noted on CT chest in 2019.   Considering GLP1 medication for  CVD prevention.   She has been doing keto, Exercising daily and resistance bands.    Hypertension: 2 months ago cardiology has increased metoprolol... has improved some. BP Readings from  Last 3 Encounters:  05/11/23 130/66  04/27/23 (!) 150/70  04/20/23 130/77  Using medication without problems or lightheadedness:  none Chest pain with exertion:none Edema:none Short of breath: none  Average home BPs: Other issues:    Relevant past medical, surgical, family and social history reviewed and updated as indicated. Interim medical history since our last visit reviewed. Allergies and medications reviewed and updated. Outpatient Medications Prior to Visit  Medication Sig Dispense Refill   Alpha-Lipoic Acid 200 MG CAPS Take by mouth.     amLODipine (NORVASC) 10 MG tablet TAKE 1 TABLET(10 MG) BY MOUTH DAILY 90 tablet 3   Ascorbic Acid (VITAMIN C PO) Take by mouth. 40 mg     aspirin EC 81 MG tablet Take 81 mg by mouth daily. Swallow whole.     cetirizine (ZYRTEC) 10 MG tablet Take 10 mg by mouth daily.     Cholecalciferol (VITAMIN D3 PO) Take by mouth. 1000 units 2 times daily     clobetasol (TEMOVATE) 0.05 % external solution Apply 1 Application topically 2 (two) times daily as needed.     clobetasol ointment (TEMOVATE) 0.05 % Apply 1 Application topically 2 (two) times daily as needed.     Clobetasol Propionate 0.05 % shampoo Use three times weekly as needed     Coenzyme Q10 (COQ10) 100 MG CAPS Take by mouth.  doxazosin (CARDURA) 1 MG tablet Take 1 tablet (1 mg total) by mouth daily as needed. Take for blood pressure greater then 160. 90 tablet 3   ezetimibe (ZETIA) 10 MG tablet TAKE 1 TABLET(10 MG) BY MOUTH DAILY 90 tablet 2   Folic Acid (FOLATE PO) Take by mouth. 400 mg     lisinopril (ZESTRIL) 40 MG tablet Take 1 tablet (40 mg total) by mouth daily. 100 tablet 3   MAGNESIUM PO Take 425 mg by mouth 2 (two) times daily.     meloxicam (MOBIC) 15 MG tablet Take 1 tablet (15 mg total) by mouth daily. 30 tablet 1   Omega-3 Fatty Acids (OMEGA 3 PO) Take by mouth. 1280 mg     omeprazole (PRILOSEC) 10 MG capsule TAKE 1 CAPSULE(10 MG) BY MOUTH DAILY 90 capsule 3   Probiotic  Product (PROBIOTIC DAILY PO) Take 1 capsule by mouth in the morning, at noon, and at bedtime.     rosuvastatin (CRESTOR) 10 MG tablet TAKE 1 TABLET(10 MG) BY MOUTH DAILY 90 tablet 3   Sodium Hyaluronate, oral, (HYALURONIC ACID PO) Take by mouth. 125 mg plus Vitamin C 12.5 x 2     thiamine (VITAMIN B-1) 100 MG tablet Take 100 mg by mouth daily.     traZODone (DESYREL) 50 MG tablet Take 1 tablet (50 mg total) by mouth at bedtime as needed for sleep. 30 tablet 2   triamcinolone cream (KENALOG) 0.1 % Apply 1 Application topically 2 (two) times daily as needed.     venlafaxine XR (EFFEXOR-XR) 150 MG 24 hr capsule TAKE 1 CAPSULE(150 MG) BY MOUTH DAILY 90 capsule 0   vitamin B-12 (CYANOCOBALAMIN) 1000 MCG tablet Take 1,000 mcg by mouth daily.     ALPRAZolam (XANAX) 0.25 MG tablet Take 1 tablet (0.25 mg total) by mouth daily as needed for anxiety. 30 tablet 0   metoprolol succinate (TOPROL-XL) 100 MG 24 hr tablet Take 1 tablet (100 mg total) by mouth 2 (two) times daily. 90 tablet 3   No facility-administered medications prior to visit.     Per HPI unless specifically indicated in ROS section below Review of Systems  Constitutional:  Negative for fatigue and fever.  HENT:  Negative for congestion.   Eyes:  Negative for pain.  Respiratory:  Negative for cough and shortness of breath.   Cardiovascular:  Negative for chest pain, palpitations and leg swelling.  Gastrointestinal:  Negative for abdominal pain.  Genitourinary:  Negative for dysuria and vaginal bleeding.  Musculoskeletal:  Negative for back pain.  Neurological:  Negative for syncope, light-headedness and headaches.  Psychiatric/Behavioral:  Negative for dysphoric mood.    Objective:  BP 130/66 (BP Location: Left Arm, Patient Position: Sitting, Cuff Size: Large)   Pulse 94   Temp 98.3 F (36.8 C) (Temporal)   Ht 5\' 4"  (1.626 m)   Wt 199 lb 2 oz (90.3 kg)   SpO2 98%   BMI 34.18 kg/m   Wt Readings from Last 3 Encounters:  05/11/23  199 lb 2 oz (90.3 kg)  05/09/23 199 lb (90.3 kg)  04/27/23 199 lb 2 oz (90.3 kg)      Physical Exam Vitals and nursing note reviewed.  Constitutional:      General: She is not in acute distress.    Appearance: Normal appearance. She is well-developed. She is not ill-appearing or toxic-appearing.  HENT:     Head: Normocephalic.     Right Ear: Hearing, tympanic membrane, ear canal and external ear normal.  Left Ear: Hearing, tympanic membrane, ear canal and external ear normal.     Nose: Nose normal.  Eyes:     General: Lids are normal. Lids are everted, no foreign bodies appreciated.     Conjunctiva/sclera: Conjunctivae normal.     Pupils: Pupils are equal, round, and reactive to light.  Neck:     Thyroid: No thyroid mass or thyromegaly.     Vascular: No carotid bruit.     Trachea: Trachea normal.  Cardiovascular:     Rate and Rhythm: Normal rate and regular rhythm.     Heart sounds: Normal heart sounds, S1 normal and S2 normal. No murmur heard.    No gallop.  Pulmonary:     Effort: Pulmonary effort is normal. No respiratory distress.     Breath sounds: Normal breath sounds. No wheezing, rhonchi or rales.  Abdominal:     General: Bowel sounds are normal. There is no distension or abdominal bruit.     Palpations: Abdomen is soft. There is no fluid wave or mass.     Tenderness: There is no abdominal tenderness. There is no guarding or rebound.     Hernia: No hernia is present.  Musculoskeletal:     Cervical back: Normal range of motion and neck supple.  Lymphadenopathy:     Cervical: No cervical adenopathy.  Skin:    General: Skin is warm and dry.     Findings: No rash.     Comments: Healing biopsy on right upper forearm, indentation on left upper forehead  Neurological:     Mental Status: She is alert.     Cranial Nerves: No cranial nerve deficit.     Sensory: No sensory deficit.  Psychiatric:        Mood and Affect: Mood is not anxious or depressed.        Speech:  Speech normal.        Behavior: Behavior normal. Behavior is cooperative.        Judgment: Judgment normal.       Results for orders placed or performed in visit on 01/10/23  CBC with Differential/Platelet  Result Value Ref Range   WBC 10.6 (H) 4.0 - 10.5 K/uL   RBC 4.53 3.87 - 5.11 Mil/uL   Hemoglobin 13.8 12.0 - 15.0 g/dL   HCT 25.9 56.3 - 87.5 %   MCV 93.7 78.0 - 100.0 fl   MCHC 32.5 30.0 - 36.0 g/dL   RDW 64.3 32.9 - 51.8 %   Platelets 325.0 150.0 - 400.0 K/uL   Neutrophils Relative % 68.0 43.0 - 77.0 %   Lymphocytes Relative 22.8 12.0 - 46.0 %   Monocytes Relative 7.2 3.0 - 12.0 %   Eosinophils Relative 1.1 0.0 - 5.0 %   Basophils Relative 0.9 0.0 - 3.0 %   Neutro Abs 7.2 1.4 - 7.7 K/uL   Lymphs Abs 2.4 0.7 - 4.0 K/uL   Monocytes Absolute 0.8 0.1 - 1.0 K/uL   Eosinophils Absolute 0.1 0.0 - 0.7 K/uL   Basophils Absolute 0.1 0.0 - 0.1 K/uL  Comprehensive metabolic panel  Result Value Ref Range   Sodium 134 (L) 135 - 145 mEq/L   Potassium 4.2 3.5 - 5.1 mEq/L   Chloride 97 96 - 112 mEq/L   CO2 29 19 - 32 mEq/L   Glucose, Bld 103 (H) 70 - 99 mg/dL   BUN 15 6 - 23 mg/dL   Creatinine, Ser 8.41 0.40 - 1.20 mg/dL   Total Bilirubin 0.4 0.2 -  1.2 mg/dL   Alkaline Phosphatase 125 (H) 39 - 117 U/L   AST 45 (H) 0 - 37 U/L   ALT 42 (H) 0 - 35 U/L   Total Protein 8.0 6.0 - 8.3 g/dL   Albumin 4.7 3.5 - 5.2 g/dL   GFR 34.74 >25.95 mL/min   Calcium 9.8 8.4 - 10.5 mg/dL  TSH  Result Value Ref Range   TSH 2.68 0.35 - 5.50 uIU/mL    This visit occurred during the SARS-CoV-2 public health emergency.  Safety protocols were in place, including screening questions prior to the visit, additional usage of staff PPE, and extensive cleaning of exam room while observing appropriate contact time as indicated for disinfecting solutions.   COVID 19 screen:  No recent travel or known exposure to COVID19 The patient denies respiratory symptoms of COVID 19 at this time. The importance of social  distancing was discussed today.   Assessment and Plan   The patient's preventative maintenance and recommended screening tests for an annual wellness exam were reviewed in full today. Brought up to date unless services declined.  Counselled on the importance of diet, exercise, and its role in overall health and mortality. The patient's FH and SH was reviewed, including their home life, tobacco status, and drug and alcohol status.   Vaccines:S/P 5 COVID vaccines, PNA and  has had flu, due for td Pap/DVE: not indicated, no HPV and neg in 2019 Mammo: 05/11/2023 scheduled Bone Density:osteopenia 2019, 06/2022 T-3.1 in forearm, other areas osteopenia Colon:01/22/2020, repeat in 3 years, Dr. Russella Dar, 02/07/2023  Smoking Status:none ETOH/ drug use:1-2/n a day/none  Hep C: done  Problem List Items Addressed This Visit     Aortic atherosclerosis (HCC)    On statin Followed by cardiology.      Chronic insomnia   Coronary artery calcification    Patient is a good candidate for GLP-1 medication to decrease CVD risk given current coronary artery calcification seen on CT chest.      GAD (generalized anxiety disorder)    Acute,  well controlled.. has only used 4  tablets in last year.  Will provide Xanax to use as needed on a limited basis giving recent additional stressors.  We did discuss in detail addictive nature of benzodiazepines and increased risk of use after age 53.  She understands risks and will limit use.      Relevant Medications   ALPRAZolam (XANAX) 0.25 MG tablet   Hyperlipidemia     Chronic, due for re-eval.  Rosuvastatin 10 mg daily Zetia 10 mg p.o. daily      Hypertension    Chronic  Max lisinopril 40 mg daily, max amlodipine 10 mg daily as well as Toprol XL 100 mg daily. We discussed potentially adding hydrochlorothiazide to her medication regimen to lower blood pressure further.  She is hesitant given her history of hyponatremia.  I do think this is still an option  given it has been normal the last 3 years.   I will forward my note to Dr. Mariah Milling cardiology for additional recommendations per patient request.       Localized osteoporosis without current pathological fracture    Osteoporosis seen in forearm on January 2024 DEXA.  Spine and hip are in osteopenia range. Recommend weight bearing exercise, calcium in diet and vit D supplement 400 IU 1-2 times daily. She would like to hold off on medication to treat given primarily in the forearm and not major areas.  Will reevaluate in January 2026.  MDD (major depressive disorder), single episode, moderate (HCC)    Chronic, stable  venlafaxine XL 150 mg p.o. daily.   Occasionally using alprazolam as needed.  Trazodone  50 mg po QhS prn insomnia  Seeing counselor.      Relevant Medications   ALPRAZolam (XANAX) 0.25 MG tablet   OSA (obstructive sleep apnea)     Feeling much better on CPAP.  Dr. Vassie Loll.       Plaque psoriasis   Vitamin D deficiency    Due for reevaluation.      Relevant Orders   VITAMIN D 25 Hydroxy (Vit-D Deficiency, Fractures)   Other Visit Diagnoses     Routine general medical examination at a health care facility    -  Primary   Age-related osteoporosis without current pathological fracture       High cholesterol       Relevant Orders   Lipid panel   Comprehensive metabolic panel   Screening for diabetes mellitus       Relevant Orders   Hemoglobin A1c        Kerby Nora, MD

## 2023-05-11 NOTE — Assessment & Plan Note (Addendum)
Chronic, due for re-eval.  Rosuvastatin 10 mg daily Zetia 10 mg p.o. daily

## 2023-05-15 ENCOUNTER — Encounter (HOSPITAL_BASED_OUTPATIENT_CLINIC_OR_DEPARTMENT_OTHER): Payer: Self-pay | Admitting: Pulmonary Disease

## 2023-05-15 ENCOUNTER — Ambulatory Visit (HOSPITAL_BASED_OUTPATIENT_CLINIC_OR_DEPARTMENT_OTHER): Payer: Medicare HMO | Admitting: Pulmonary Disease

## 2023-05-15 VITALS — BP 138/68 | HR 84 | Resp 16 | Ht 64.0 in | Wt 200.2 lb

## 2023-05-15 DIAGNOSIS — I10 Essential (primary) hypertension: Secondary | ICD-10-CM

## 2023-05-15 DIAGNOSIS — G4733 Obstructive sleep apnea (adult) (pediatric): Secondary | ICD-10-CM

## 2023-05-15 NOTE — Patient Instructions (Addendum)
  CPAP is working well on current auto settings 5 to 15 cm. Average pressure required is 11 cm CPAP supplies will be renewed for a year

## 2023-05-15 NOTE — Progress Notes (Signed)
   Subjective:    Patient ID: Christy Freeman, female    DOB: 1951-01-01, 72 y.o.   MRN: [id number]  HPI  72 year old woman for follow-up of OSA. This is a post CPAP set up visit She was diagnosed in September and set up with a CPAP machine.  She opted for nasal mask.  This has worked well for her.  She wakes up feeling rested.  Denies dryness of mouth.  She does not have sleep pressure in the afternoons.  Overall her energy levels have improved.  She is happy with her CPAP experience  Her husband is also going through a sleep evaluation   Significant tests/ events reviewed  HST 01/2023 showed mild OSA with AHI 13/ hr & low sat 78% - 2 night study   Review of Systems neg for any significant sore throat, dysphagia, itching, sneezing, nasal congestion or excess/ purulent secretions, fever, chills, sweats, unintended wt loss, pleuritic or exertional cp, hempoptysis, orthopnea pnd or change in chronic leg swelling. Also denies presyncope, palpitations, heartburn, abdominal pain, nausea, vomiting, diarrhea or change in bowel or urinary habits, dysuria,hematuria, rash, arthralgias, visual complaints, headache, numbness weakness or ataxia.     Objective:   Physical Exam  Gen. Pleasant, obese, in no distress ENT - no lesions, no post nasal drip Neck: No JVD, no thyromegaly, no carotid bruits Lungs: no use of accessory muscles, no dullness to percussion, decreased without rales or rhonchi  Cardiovascular: Rhythm regular, heart sounds  normal, no murmurs or gallops, no peripheral edema Musculoskeletal: No deformities, no cyanosis or clubbing , no tremors       Assessment & Plan:

## 2023-05-15 NOTE — Assessment & Plan Note (Signed)
CPAP download was reviewed and shows excellent control of her events on auto settings 5 to 15 cm with average pressure of 11 maximum pressure of 13 cm.  She has a mild leak and she has adjusted the straps to decrease this. She is very compliant and CPAP certainly helped improve her daytime somnolence and fatigue.  CPAP supplies will be renewed for a year.  She seems to have settled down well  Weight loss encouraged, compliance with goal of at least 4-6 hrs every night is the expectation. Advised against medications with sedative side effects Cautioned against driving when sleepy - understanding that sleepiness will vary on a day to day basis

## 2023-05-15 NOTE — Assessment & Plan Note (Signed)
Well controlled 

## 2023-05-16 ENCOUNTER — Other Ambulatory Visit (INDEPENDENT_AMBULATORY_CARE_PROVIDER_SITE_OTHER): Payer: Medicare HMO

## 2023-05-16 DIAGNOSIS — E7800 Pure hypercholesterolemia, unspecified: Secondary | ICD-10-CM | POA: Diagnosis not present

## 2023-05-16 DIAGNOSIS — E559 Vitamin D deficiency, unspecified: Secondary | ICD-10-CM

## 2023-05-16 DIAGNOSIS — Z131 Encounter for screening for diabetes mellitus: Secondary | ICD-10-CM

## 2023-05-16 LAB — LIPID PANEL
Cholesterol: 171 mg/dL (ref 0–200)
HDL: 67.5 mg/dL
LDL Cholesterol: 77 mg/dL (ref 0–99)
NonHDL: 103.85
Total CHOL/HDL Ratio: 3
Triglycerides: 132 mg/dL (ref 0.0–149.0)
VLDL: 26.4 mg/dL (ref 0.0–40.0)

## 2023-05-16 LAB — COMPREHENSIVE METABOLIC PANEL
ALT: 48 U/L — ABNORMAL HIGH (ref 0–35)
AST: 68 U/L — ABNORMAL HIGH (ref 0–37)
Albumin: 4.8 g/dL (ref 3.5–5.2)
Alkaline Phosphatase: 135 U/L — ABNORMAL HIGH (ref 39–117)
BUN: 12 mg/dL (ref 6–23)
CO2: 30 meq/L (ref 19–32)
Calcium: 10 mg/dL (ref 8.4–10.5)
Chloride: 99 meq/L (ref 96–112)
Creatinine, Ser: 0.6 mg/dL (ref 0.40–1.20)
GFR: 89.4 mL/min (ref >60.00)
Glucose, Bld: 102 mg/dL — ABNORMAL HIGH (ref 70–99)
Potassium: 5 meq/L (ref 3.5–5.1)
Sodium: 137 meq/L (ref 135–145)
Total Bilirubin: 0.7 mg/dL (ref 0.2–1.2)
Total Protein: 8.3 g/dL (ref 6.0–8.3)

## 2023-05-16 LAB — VITAMIN D 25 HYDROXY (VIT D DEFICIENCY, FRACTURES): VITD: 31.64 ng/mL (ref 30.00–100.00)

## 2023-05-16 LAB — HEMOGLOBIN A1C: Hgb A1c MFr Bld: 6.1 % (ref 4.6–6.5)

## 2023-05-17 ENCOUNTER — Ambulatory Visit: Payer: Medicare HMO | Attending: Physician Assistant

## 2023-05-17 DIAGNOSIS — I10 Essential (primary) hypertension: Secondary | ICD-10-CM

## 2023-05-17 DIAGNOSIS — R030 Elevated blood-pressure reading, without diagnosis of hypertension: Secondary | ICD-10-CM | POA: Diagnosis not present

## 2023-05-17 NOTE — Progress Notes (Unsigned)
24 hour ambulatory blood pressure monitor applied to patients left arm using a standard adult cuff.

## 2023-05-19 ENCOUNTER — Encounter: Payer: Self-pay | Admitting: Cardiovascular Disease

## 2023-05-25 ENCOUNTER — Ambulatory Visit: Payer: Medicare HMO | Admitting: Podiatry

## 2023-05-25 ENCOUNTER — Encounter: Payer: Self-pay | Admitting: Podiatry

## 2023-05-25 DIAGNOSIS — M2012 Hallux valgus (acquired), left foot: Secondary | ICD-10-CM | POA: Diagnosis not present

## 2023-05-25 DIAGNOSIS — M19071 Primary osteoarthritis, right ankle and foot: Secondary | ICD-10-CM | POA: Diagnosis not present

## 2023-05-25 DIAGNOSIS — M2011 Hallux valgus (acquired), right foot: Secondary | ICD-10-CM | POA: Diagnosis not present

## 2023-05-25 NOTE — Progress Notes (Unsigned)
Chief Complaint  Patient presents with   Foot Pain    "I'm hoping to get another shot.  It's been three months."    HPI: 72 y.o. female presenting today for new complaint of pain and tenderness associated to the bilateral feet right greater than the left.  Patient is concerned for arthritis.  She has been experiencing pain and tenderness throughout the right midfoot almost on a daily basis.  She has not done anything for treatment  Past Medical History:  Diagnosis Date   Allergy    seasonal allergies   Anxiety    hx- on meds   Arthritis    Right hand   Blood transfusion without reported diagnosis 1989   Cancer (HCC)    basal cell on face   Cataract    Colon polyps    GERD (gastroesophageal reflux disease)    H/O: depression    Hypercholesterolemia    diet controlled   Hypertension    Lung nodule    left lung    Psoriasis     Past Surgical History:  Procedure Laterality Date   APPENDECTOMY  06/14/2017   BREAST BIOPSY Right 2000   benign   COLONOSCOPY  2016   TA x 1   CYST REMOVAL HAND Left    DILATION AND CURETTAGE OF UTERUS     LAPAROSCOPIC APPENDECTOMY N/A 06/14/2017   Procedure: APPENDECTOMY LAPAROSCOPIC;  Surgeon: Ricarda Frame, MD;  Location: [facility] ORS;  Service: General;  Laterality: N/A;   POLYPECTOMY  2016   TA    No Known Allergies   Physical Exam: General: The patient is alert and oriented x3 in no acute distress.  Dermatology: Skin is warm, dry and supple bilateral lower extremities.   Vascular: Palpable pedal pulses bilaterally. Capillary refill within normal limits.  No appreciable edema.  No erythema.  Neurological: Grossly intact via light touch  Musculoskeletal Exam: Degenerative changes noted with pain on palpation throughout the second third TMT of the bilateral feet right greater than the left.  There is dorsal osseous prominence noted throughout the midtarsal joint of the right foot as well consistent with findings of  arthritis  Radiographic Exam B/L feet 02/09/2023:  Normal osseous mineralization.  No acute fractures identified.  Degenerative changes noted specifically to the second and third TMT bilateral right greater than the left.  The right foot does demonstrate some periarticular 'beaking' on lateral view.  Moderate hallux valgus deformity also noted bilateral  Assessment/Plan of Care: 1.  DJD/arthritis bilateral TMT. RT > LT 2.  Moderate hallux valgus bilateral  -Patient evaluated.  X-rays reviewed -Injection of 0.5 cc Celestone Soluspan injected into the second third TMT of the right foot -Prescription for meloxicam 15 mg daily.  Take occasionally as needed -Recommend good supportive shoes with arch supports to support the medial longitudinal arch of the foot.  Advised against going barefoot -We did discuss surgery as a last resort however I do not believe the patient is at the point of needing surgery since she has not pursued conservative care for a while. -In regards to the bunion these are asymptomatic.  Recommend wide shoes that do not irritate or constrict the toebox area.  Conservative treatment for now       Felecia Shelling, DPM [facility]  Dr. Felecia Shelling, DPM    [address]  [place], [place] [address]                Office [phone]  Fax [phone]

## 2023-05-27 ENCOUNTER — Other Ambulatory Visit: Payer: Self-pay | Admitting: Family Medicine

## 2023-05-27 DIAGNOSIS — K2100 Gastro-esophageal reflux disease with esophagitis, without bleeding: Secondary | ICD-10-CM

## 2023-05-28 DIAGNOSIS — G4733 Obstructive sleep apnea (adult) (pediatric): Secondary | ICD-10-CM | POA: Diagnosis not present

## 2023-06-05 ENCOUNTER — Ambulatory Visit: Payer: Medicare HMO | Admitting: Podiatry

## 2023-06-12 ENCOUNTER — Encounter: Payer: Self-pay | Admitting: Emergency Medicine

## 2023-06-14 DIAGNOSIS — M19071 Primary osteoarthritis, right ankle and foot: Secondary | ICD-10-CM | POA: Diagnosis not present

## 2023-06-14 MED ORDER — BETAMETHASONE SOD PHOS & ACET 6 (3-3) MG/ML IJ SUSP
3.0000 mg | Freq: Once | INTRAMUSCULAR | Status: AC
  Administered 2023-06-14: 3 mg via INTRA_ARTICULAR

## 2023-06-15 ENCOUNTER — Telehealth: Payer: Self-pay | Admitting: Cardiovascular Disease

## 2023-06-15 NOTE — Telephone Encounter (Signed)
 Patient is requesting to speak with RN Danelle Earthly. Please advise.

## 2023-06-21 DIAGNOSIS — G4733 Obstructive sleep apnea (adult) (pediatric): Secondary | ICD-10-CM | POA: Diagnosis not present

## 2023-06-21 NOTE — Telephone Encounter (Signed)
See call note and med note 1/15&16

## 2023-06-22 ENCOUNTER — Telehealth: Payer: Self-pay | Admitting: Pharmacist

## 2023-06-22 DIAGNOSIS — Z6834 Body mass index (BMI) 34.0-34.9, adult: Secondary | ICD-10-CM

## 2023-06-22 MED ORDER — TIRZEPATIDE-WEIGHT MANAGEMENT 5 MG/0.5ML ~~LOC~~ SOLN
5.0000 mg | SUBCUTANEOUS | 12 refills | Status: DC
Start: 2023-06-22 — End: 2023-07-09

## 2023-06-22 MED ORDER — TIRZEPATIDE-WEIGHT MANAGEMENT 2.5 MG/0.5ML ~~LOC~~ SOLN: 2.5000 mg | SUBCUTANEOUS | 0 refills | Status: DC

## 2023-06-22 MED ORDER — CARVEDILOL 25 MG PO TABS: 25.0000 mg | ORAL_TABLET | Freq: Two times a day (BID) | ORAL | 3 refills | Status: DC

## 2023-06-25 ENCOUNTER — Other Ambulatory Visit: Payer: Self-pay | Admitting: Family Medicine

## 2023-06-25 DIAGNOSIS — I10 Essential (primary) hypertension: Secondary | ICD-10-CM

## 2023-06-25 DIAGNOSIS — K2100 Gastro-esophageal reflux disease with esophagitis, without bleeding: Secondary | ICD-10-CM

## 2023-06-27 ENCOUNTER — Other Ambulatory Visit (HOSPITAL_COMMUNITY): Payer: Self-pay

## 2023-06-27 ENCOUNTER — Telehealth: Payer: Self-pay | Admitting: Pharmacy Technician

## 2023-06-27 NOTE — Telephone Encounter (Addendum)
Pharmacy Patient Advocate Encounter   Received notification from Fax that prior authorization for zepbound is required/requested.   Insurance verification completed.   The patient is insured through [facility] .   Per test claim: PA required; PA submitted to above mentioned insurance via CoverMyMeds Key/confirmation #/EOC [id number] Status is pending

## 2023-07-04 ENCOUNTER — Other Ambulatory Visit (HOSPITAL_COMMUNITY): Payer: Self-pay

## 2023-07-05 NOTE — Telephone Encounter (Signed)
Calling to see if patient will be following a reduce calories and exercising while taking medication. Fax [phone]. They need an answer by 9 in the morning.Please advise

## 2023-07-06 ENCOUNTER — Other Ambulatory Visit (HOSPITAL_COMMUNITY): Payer: Self-pay

## 2023-07-06 NOTE — Telephone Encounter (Signed)
Called back to to say medication has been approve they are sending a fax over. Please advise

## 2023-07-09 MED ORDER — ZEPBOUND 2.5 MG/0.5ML ~~LOC~~ SOAJ: 2.5000 mg | SUBCUTANEOUS | 0 refills | Status: DC

## 2023-07-09 NOTE — Telephone Encounter (Signed)
Tirzepatide approved and pens sent to pharmacy. D/C single use vials

## 2023-07-10 ENCOUNTER — Other Ambulatory Visit (HOSPITAL_COMMUNITY): Payer: Self-pay

## 2023-07-10 NOTE — Telephone Encounter (Signed)
 Information has been sent to clinical pharmacist for appeals review. It may take 5-7 days to prepare the necessary documentation to request the appeal from the insurance.

## 2023-07-11 ENCOUNTER — Other Ambulatory Visit (HOSPITAL_COMMUNITY): Payer: Self-pay

## 2023-07-12 ENCOUNTER — Other Ambulatory Visit (HOSPITAL_COMMUNITY): Payer: Self-pay

## 2023-07-17 NOTE — Telephone Encounter (Signed)
Pharmacy Patient Advocate Encounter  Received notification from [facility] that Prior Authorization for zepbound has been APPROVED from 07/06/23 to 07/05/24   PA #/Case ID/Reference #: [id number]

## 2023-07-22 DIAGNOSIS — G4733 Obstructive sleep apnea (adult) (pediatric): Secondary | ICD-10-CM | POA: Diagnosis not present

## 2023-08-03 ENCOUNTER — Encounter: Payer: Self-pay | Admitting: Cardiovascular Disease

## 2023-08-10 DIAGNOSIS — K080 Exfoliation of teeth due to systemic causes: Secondary | ICD-10-CM | POA: Diagnosis not present

## 2023-08-16 DIAGNOSIS — K080 Exfoliation of teeth due to systemic causes: Secondary | ICD-10-CM | POA: Diagnosis not present

## 2023-08-19 DIAGNOSIS — G4733 Obstructive sleep apnea (adult) (pediatric): Secondary | ICD-10-CM | POA: Diagnosis not present

## 2023-08-22 DIAGNOSIS — G4733 Obstructive sleep apnea (adult) (pediatric): Secondary | ICD-10-CM | POA: Diagnosis not present

## 2023-09-04 DIAGNOSIS — Z6834 Body mass index (BMI) 34.0-34.9, adult: Secondary | ICD-10-CM

## 2023-09-04 MED ORDER — TIRZEPATIDE-WEIGHT MANAGEMENT 7.5 MG/0.5ML ~~LOC~~ SOLN: 7.5000 mg | SUBCUTANEOUS | 1 refills | Status: DC

## 2023-09-06 ENCOUNTER — Other Ambulatory Visit (HOSPITAL_COMMUNITY): Payer: Self-pay

## 2023-09-07 ENCOUNTER — Telehealth: Payer: Self-pay | Admitting: Pharmacy Technician

## 2023-09-07 NOTE — Telephone Encounter (Signed)
 hi! it is showing up that the patient was sent in mounjaro 7.5mg  (tirzepatide 7.5 MG/0.5ML injection vial [[id number]] ) on 09/04/23 instead of the zepbound 7.5mg . we got a notification in covermymeds to do a prior auth for mounjaro 7.5mg  by [facility] on 09/04/23. Can you send in zepbound and cancel the mounjaro please? Thanks!

## 2023-09-10 MED ORDER — ZEPBOUND 7.5 MG/0.5ML ~~LOC~~ SOLN: 7.5000 mg | SUBCUTANEOUS | 1 refills | Status: DC

## 2023-09-10 NOTE — Telephone Encounter (Signed)
 I called and spoke to [facility] and they said they received zepbound now. They said it hast to be ordered. It will be 99.00 and will be in today's truck

## 2023-09-10 NOTE — Addendum Note (Signed)
 Addended by: Jani Gravel on: 09/10/2023 08:52 AM   Modules accepted: Orders

## 2023-09-13 DIAGNOSIS — K080 Exfoliation of teeth due to systemic causes: Secondary | ICD-10-CM | POA: Diagnosis not present

## 2023-09-28 ENCOUNTER — Other Ambulatory Visit: Payer: Self-pay | Admitting: Family Medicine

## 2023-10-11 DIAGNOSIS — L718 Other rosacea: Secondary | ICD-10-CM | POA: Diagnosis not present

## 2023-10-11 DIAGNOSIS — L570 Actinic keratosis: Secondary | ICD-10-CM | POA: Diagnosis not present

## 2023-10-11 DIAGNOSIS — L821 Other seborrheic keratosis: Secondary | ICD-10-CM | POA: Diagnosis not present

## 2023-11-08 ENCOUNTER — Emergency Department

## 2023-11-08 ENCOUNTER — Emergency Department
Admission: EM | Admit: 2023-11-08 | Discharge: 2023-11-08 | Disposition: A | Discharge status: Home / Self Care | Attending: Emergency Medicine | Admitting: Emergency Medicine

## 2023-11-08 ENCOUNTER — Encounter: Payer: Self-pay | Admitting: Emergency Medicine

## 2023-11-08 ENCOUNTER — Other Ambulatory Visit: Payer: Self-pay

## 2023-11-08 DIAGNOSIS — S92515A Nondisplaced fracture of proximal phalanx of left lesser toe(s), initial encounter for closed fracture: Secondary | ICD-10-CM | POA: Insufficient documentation

## 2023-11-08 DIAGNOSIS — I10 Essential (primary) hypertension: Secondary | ICD-10-CM | POA: Insufficient documentation

## 2023-11-08 DIAGNOSIS — W228XXA Striking against or struck by other objects, initial encounter: Secondary | ICD-10-CM | POA: Diagnosis not present

## 2023-11-08 DIAGNOSIS — S99922A Unspecified injury of left foot, initial encounter: Secondary | ICD-10-CM | POA: Diagnosis not present

## 2023-11-08 MED ORDER — ZEPBOUND 10 MG/0.5ML ~~LOC~~ SOAJ: 10.0000 mg | SUBCUTANEOUS | 2 refills | Status: DC

## 2023-11-08 NOTE — ED Provider Notes (Signed)
 [facility] Provider Note    Event Date/Time   First MD Initiated Contact with Patient 11/08/23 1315     (approximate)   History   Toe Pain   HPI  Christy Freeman is a 73 y.o. female with PMH of HTN, GERD, arthritis psoriasis, anxiety presents for evaluation of a left second toe pain.  Patient states she stubbed her toe about a week and a half ago.  She has had continued pain and is not able to see her podiatrist till the beginning of July.      Physical Exam   Triage Vital Signs: ED Triage Vitals [11/08/23 1218]  Encounter Vitals Group     BP 120/74     Systolic BP Percentile      Diastolic BP Percentile      Pulse Rate 69     Resp 16     Temp 98.7 F (37.1 C)     Temp Source Oral     SpO2 98 %     Weight 200 lb 2.8 oz (90.8 kg)     Height 5\' 4"  (1.626 m)     Head Circumference      Peak Flow      Pain Score 8     Pain Loc      Pain Education      Exclude from Growth Chart     Most recent vital signs: Vitals:   11/08/23 1218  BP: 120/74  Pulse: 69  Resp: 16  Temp: 98.7 F (37.1 C)  SpO2: 98%   General: Awake, no distress.  CV:  Good peripheral perfusion.  Resp:  Normal effort.  Abd:  No distention.  Other:  Left second toe is swollen and tender to palpation at the base.  Capillary refill appropriate.   ED Results / Procedures / Treatments   Labs (all labs ordered are listed, but only abnormal results are displayed) Labs Reviewed - No data to display  RADIOLOGY  Left second toe x-ray obtained, interpreted the images as well as reviewed the radiologist report which showed a nondisplaced intra-articular fracture of the distal aspect of the proximal phalanx of the second toe.  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   PROCEDURES:  Critical Care performed: No  Procedures   MEDICATIONS ORDERED IN ED: Medications - No data to  display   IMPRESSION / MDM / ASSESSMENT AND PLAN / ED COURSE  I reviewed the triage vital signs and the nursing notes.                             73 year old female presents for evaluation of left second toe pain.  Vital signs are stable patient NAD on exam.  Differential diagnosis includes, but is not limited to, fracture, dislocation, contusion.  Patient's presentation is most consistent with acute complicated illness / injury requiring diagnostic workup.  X-ray shows nondisplaced intra-articular fracture of the distal proximal phalanx of the second toe.  Was placed in a postop shoe and advised to follow-up with podiatry.  She can take Tylenol  and ibuprofen as needed for pain.  She voiced understanding, all questions were answered and she was stable at discharge.     FINAL CLINICAL IMPRESSION(S) / ED DIAGNOSES   Final diagnoses:  Nondisplaced fracture of proximal phalanx of left lesser toe(s), initial encounter for closed fracture     Rx / DC  Orders   ED Discharge Orders     None        Note:  This document was prepared using Dragon voice recognition software and may include unintentional dictation errors.   Phyliss Breen, PA-C 11/08/23 1457    Charleen Conn, MD 11/08/23 [place]

## 2023-11-08 NOTE — Discharge Instructions (Signed)
 The x-ray of your toe showed that it is in fact broken.  Please wear the postop shoe anytime you are up walking around.  Follow-up with podiatry.  Their information is attached.  You can take 650 mg of Tylenol  and 600 mg of ibuprofen every 6 hours as needed for pain. You can use ice, heat, muscle creams and other topical pain relievers as well.

## 2023-11-08 NOTE — ED Triage Notes (Signed)
 Pt here with toe pain, left foot-2nd toe. Pt states she hit it a week ago but is still having pain. Pt is still able to walk on the affected foot.

## 2023-11-27 ENCOUNTER — Encounter: Payer: Self-pay | Admitting: Podiatry

## 2023-11-27 ENCOUNTER — Ambulatory Visit: Admitting: Podiatry

## 2023-11-27 VITALS — Ht 64.0 in | Wt 200.2 lb

## 2023-11-27 DIAGNOSIS — S92505A Nondisplaced unspecified fracture of left lesser toe(s), initial encounter for closed fracture: Secondary | ICD-10-CM | POA: Diagnosis not present

## 2023-11-28 NOTE — Progress Notes (Signed)
   Chief Complaint  Patient presents with   Toe Injury    Pt states she stump her toe on concrete then drop a pot on foot, was seen in the ED due to this on 6'5 X-RAYS were done and was told that second toe on left foot was fracture, states no pain at the moment, just wanted to f/u.    HPI: 73 y.o. female presenting today for evaluation of fracture to the left second toe.  Diagnosed on 11/08/2023.  Currently she no longer has any pain or tenderness.  She is currently WBAT and essentially full activity in tennis shoes and sandals  Past Medical History:  Diagnosis Date   Allergy    seasonal allergies   Anxiety    hx- on meds   Arthritis    Right hand   Blood transfusion without reported diagnosis 1989   Cancer (HCC)    basal cell on face   Cataract    Colon polyps    GERD (gastroesophageal reflux disease)    H/O: depression    Hypercholesterolemia    diet controlled   Hypertension    Lung nodule    left lung    Psoriasis     Past Surgical History:  Procedure Laterality Date   APPENDECTOMY  06/14/2017   BREAST BIOPSY Right 2000   benign   COLONOSCOPY  2016   TA x 1   CYST REMOVAL HAND Left    DILATION AND CURETTAGE OF UTERUS     LAPAROSCOPIC APPENDECTOMY N/A 06/14/2017   Procedure: APPENDECTOMY LAPAROSCOPIC;  Surgeon: Shelva Dunnings, MD;  Location: [facility] ORS;  Service: General;  Laterality: N/A;   POLYPECTOMY  2016   TA    No Known Allergies   Physical Exam: General: The patient is alert and oriented x3 in no acute distress.  Dermatology: Skin is warm, dry and supple bilateral lower extremities.   Vascular: Palpable pedal pulses bilaterally. Capillary refill within normal limits.  No appreciable edema.  No erythema.  Neurological: Grossly intact via light touch  Musculoskeletal Exam: No pedal deformities noted.  Minimal tenderness with palpation of the second digit  DG Toe 2nd Left 11/08/2023 IMPRESSION: 1. Acute nondisplaced intra-articular fracture of the  distal aspect of the proximal phalanx of the second toe. 2. Mild-to-moderate second DIP osteoarthritis. 3. Moderate great toe interphalangeal osteoarthritis.  Assessment/Plan of Care: 1.  Closed nondisplaced fracture second digit left foot  -Patient no longer has any pain or tenderness associated to the toe.  She may resume good supportive tennis shoes and sneakers -Slowly increase activity over the next 4 weeks -Return to clinic PRN       Thresa EMERSON Sar, DPM [facility]  Dr. Thresa EMERSON Sar, DPM    [address] [place], [place] [address]                Office [phone]  Fax [phone]

## 2023-12-04 ENCOUNTER — Ambulatory Visit: Admitting: Podiatry

## 2023-12-18 DIAGNOSIS — G4733 Obstructive sleep apnea (adult) (pediatric): Secondary | ICD-10-CM | POA: Diagnosis not present

## 2024-01-18 DIAGNOSIS — G4733 Obstructive sleep apnea (adult) (pediatric): Secondary | ICD-10-CM | POA: Diagnosis not present

## 2024-01-22 ENCOUNTER — Other Ambulatory Visit: Payer: Self-pay | Admitting: Family Medicine

## 2024-01-22 DIAGNOSIS — I10 Essential (primary) hypertension: Secondary | ICD-10-CM

## 2024-02-05 ENCOUNTER — Other Ambulatory Visit: Payer: Self-pay | Admitting: Family Medicine

## 2024-02-05 DIAGNOSIS — Z1231 Encounter for screening mammogram for malignant neoplasm of breast: Secondary | ICD-10-CM

## 2024-02-06 ENCOUNTER — Other Ambulatory Visit: Payer: Self-pay | Admitting: Physician Assistant

## 2024-02-07 NOTE — Telephone Encounter (Signed)
 Tolerates current dose Zepbound  well without any issue. Lost about 41 lbs so far since Feb 2025. Need to loose 5 more lbs to reach to goal weight. Eats healthy and exercise regularly- Yoga once a week, walking 1.5 miles 3 days a week and water aerobics - upper body strengthening- 2 to 3 days per week.

## 2024-02-13 DIAGNOSIS — D492 Neoplasm of unspecified behavior of bone, soft tissue, and skin: Secondary | ICD-10-CM | POA: Diagnosis not present

## 2024-02-13 DIAGNOSIS — L570 Actinic keratosis: Secondary | ICD-10-CM | POA: Diagnosis not present

## 2024-02-14 ENCOUNTER — Other Ambulatory Visit: Payer: Self-pay

## 2024-02-14 MED ORDER — EZETIMIBE 10 MG PO TABS: 10.0000 mg | ORAL_TABLET | Freq: Every day | ORAL | 0 refills | Status: DC

## 2024-02-15 ENCOUNTER — Other Ambulatory Visit: Payer: Self-pay | Admitting: Family Medicine

## 2024-02-15 DIAGNOSIS — Z23 Encounter for immunization: Secondary | ICD-10-CM

## 2024-02-15 MED ORDER — COVID-19 MRNA VAC-TRIS(PFIZER) 30 MCG/0.3ML IM SUSY: 0.3000 mL | PREFILLED_SYRINGE | Freq: Once | INTRAMUSCULAR | 0 refills | Status: AC

## 2024-02-15 NOTE — Telephone Encounter (Signed)
Ok to send as pended.

## 2024-02-15 NOTE — Addendum Note (Signed)
 Addended by: SEBASTIAN DANNA GRADE on: 02/15/2024 01:30 PM   Modules accepted: Orders

## 2024-02-15 NOTE — Telephone Encounter (Signed)
 Patient is needing prescription for Covid vaccine sent to [facility] on [address]

## 2024-02-15 NOTE — Telephone Encounter (Unsigned)
 Copied from CRM [id number]. Topic: Clinical - Medical Advice >> Feb 15, 2024 10:37 AM Harlene ORN wrote: Reason for CRM: Patient called. Showed up to the [facility] pharmacy requesting a prescription for the Covid shot for her and her husband, Needs it to get the vaccine. Please call back the patient.

## 2024-02-18 DIAGNOSIS — G4733 Obstructive sleep apnea (adult) (pediatric): Secondary | ICD-10-CM | POA: Diagnosis not present

## 2024-02-26 ENCOUNTER — Other Ambulatory Visit: Payer: Self-pay | Admitting: Family Medicine

## 2024-02-26 DIAGNOSIS — K2100 Gastro-esophageal reflux disease with esophagitis, without bleeding: Secondary | ICD-10-CM

## 2024-02-26 DIAGNOSIS — K080 Exfoliation of teeth due to systemic causes: Secondary | ICD-10-CM | POA: Diagnosis not present

## 2024-02-27 ENCOUNTER — Encounter: Payer: Self-pay | Admitting: Family Medicine

## 2024-02-27 ENCOUNTER — Ambulatory Visit (INDEPENDENT_AMBULATORY_CARE_PROVIDER_SITE_OTHER): Admitting: Family Medicine

## 2024-02-27 VITALS — BP 128/74 | HR 68 | Temp 98.9°F | Ht 64.0 in | Wt 155.8 lb

## 2024-02-27 DIAGNOSIS — N898 Other specified noninflammatory disorders of vagina: Secondary | ICD-10-CM | POA: Diagnosis not present

## 2024-02-27 DIAGNOSIS — N950 Postmenopausal bleeding: Secondary | ICD-10-CM | POA: Insufficient documentation

## 2024-02-27 LAB — POC URINALSYSI DIPSTICK (AUTOMATED)
Bilirubin, UA: NEGATIVE
Blood, UA: NEGATIVE
Glucose, UA: NEGATIVE
Ketones, UA: NEGATIVE
Nitrite, UA: NEGATIVE
Protein, UA: NEGATIVE
Spec Grav, UA: 1.015 (ref 1.010–1.025)
Urobilinogen, UA: NEGATIVE U/dL — AB
pH, UA: 6 (ref 5.0–8.0)

## 2024-02-27 NOTE — Progress Notes (Signed)
 Patient ID: Christy Freeman, female    DOB: 07-24-1950, 73 y.o.   MRN: [id number]  This visit was conducted in person.  BP 128/74   Pulse 68   Temp 98.9 F (37.2 C) (Oral)   Ht 5' 4 (1.626 m)   Wt 155 lb 12.8 oz (70.7 kg)   SpO2 98%   BMI 26.74 kg/m    CC:  Chief Complaint  Patient presents with   Vaginal Discharge    Started yesterday. About a month ago she thought she had a yeast infection and used Monistat. It cleared but it came back yesterday    Subjective:   HPI: Christy Freeman is a 73 y.o. female presenting on 02/27/2024 for Vaginal Discharge (Started yesterday. About a month ago she thought she had a yeast infection and used Monistat. It cleared but it came back yesterday)  New onset vaginal discharge, described as brownish like old blood.  Started yesterday, none since.  No itching or irritation. Prior to that she thought she had a yeast infection about a month ago and treated with Monistat.  Symptoms resolved until yesterday.  Last sexual activity 1 month ago. Does have vaginal dryness.  Has uterus and ovaries. She is post menopausal. Saw on toilet paper after urination... feels confident it came from  She denies nausea, vomiting,  fever, chills.  Slight suprapubic pressure. No blood in urine.  No dysuria until she urinated here at the office and then did feel some stinging as the urine came through     BP Readings from Last 3 Encounters:  02/27/24 128/74  11/08/23 120/74  05/15/23 138/68    44 lbs in 6-7 months with Zepbound   12.5 mg weekly. Wt Readings from Last 3 Encounters:  02/27/24 155 lb 12.8 oz (70.7 kg)  11/27/23 200 lb 2.9 oz (90.8 kg)  11/08/23 200 lb 2.8 oz (90.8 kg)   Body mass index is 26.74 kg/m.    Relevant past medical, surgical, family and social history reviewed and updated as indicated. Interim medical history since our last visit reviewed. Allergies and medications reviewed and updated. Outpatient Medications Prior to  Visit  Medication Sig Dispense Refill   Alpha-Lipoic Acid 200 MG CAPS Take by mouth.     ALPRAZolam  (XANAX ) 0.25 MG tablet Take 1 tablet (0.25 mg total) by mouth daily as needed for anxiety. 30 tablet 0   amLODipine  (NORVASC ) 10 MG tablet TAKE 1 TABLET(10 MG) BY MOUTH DAILY 90 tablet 3   Ascorbic Acid (VITAMIN C PO) Take by mouth. 40 mg     aspirin EC 81 MG tablet Take 81 mg by mouth daily. Swallow whole.     carvedilol  (COREG ) 25 MG tablet Take 1 tablet (25 mg total) by mouth 2 (two) times daily. 180 tablet 3   cetirizine (ZYRTEC) 10 MG tablet Take 10 mg by mouth daily.     Cholecalciferol (VITAMIN D3 PO) Take by mouth. 1000 units 2 times daily     Coenzyme Q10 (COQ10) 100 MG CAPS Take by mouth.     doxazosin  (CARDURA ) 1 MG tablet Take 1 tablet (1 mg total) by mouth daily as needed. Take for blood pressure greater then 160. 90 tablet 3   ezetimibe  (ZETIA ) 10 MG tablet Take 1 tablet (10 mg total) by mouth daily. 90 tablet 0   Folic Acid (FOLATE PO) Take by mouth. 400 mg     lisinopril  (ZESTRIL ) 40 MG tablet TAKE 1 TABLET(40 MG) BY MOUTH DAILY 100 tablet  0   MAGNESIUM PO Take 425 mg by mouth 2 (two) times daily.     Omega-3 Fatty Acids (OMEGA 3 PO) Take by mouth. 1280 mg     omeprazole  (PRILOSEC) 10 MG capsule TAKE 1 CAPSULE(10 MG) BY MOUTH DAILY 90 capsule 0   Probiotic Product (PROBIOTIC DAILY PO) Take 1 capsule by mouth in the morning, at noon, and at bedtime.     rosuvastatin  (CRESTOR ) 10 MG tablet TAKE 1 TABLET(10 MG) BY MOUTH DAILY 90 tablet 3   thiamine (VITAMIN B-1) 100 MG tablet Take 100 mg by mouth daily.     tirzepatide  (ZEPBOUND ) 12.5 MG/0.5ML Pen Inject 12.5 mg into the skin once a week. 2 mL 1   venlafaxine  XR (EFFEXOR -XR) 150 MG 24 hr capsule TAKE 1 CAPSULE(150 MG) BY MOUTH DAILY 90 capsule 1   vitamin B-12 (CYANOCOBALAMIN) 1000 MCG tablet Take 1,000 mcg by mouth daily.     clobetasol (TEMOVATE) 0.05 % external solution Apply 1 Application topically 2 (two) times daily as  needed.     clobetasol ointment (TEMOVATE) 0.05 % Apply 1 Application topically 2 (two) times daily as needed.     Clobetasol Propionate 0.05 % shampoo Use three times weekly as needed     meloxicam  (MOBIC ) 15 MG tablet Take 1 tablet (15 mg total) by mouth daily. 30 tablet 1   Sodium Hyaluronate, oral, (HYALURONIC ACID PO) Take by mouth. 125 mg plus Vitamin C 12.5 x 2     traZODone  (DESYREL ) 50 MG tablet Take 1 tablet (50 mg total) by mouth at bedtime as needed for sleep. 30 tablet 2   triamcinolone  cream (KENALOG) 0.1 % Apply 1 Application topically 2 (two) times daily as needed.     No facility-administered medications prior to visit.     Per HPI unless specifically indicated in ROS section below Review of Systems  Constitutional:  Negative for fatigue and fever.  HENT:  Negative for congestion.   Eyes:  Negative for pain.  Respiratory:  Negative for cough and shortness of breath.   Cardiovascular:  Negative for chest pain, palpitations and leg swelling.  Gastrointestinal:  Negative for abdominal pain.  Genitourinary:  Negative for dysuria and vaginal bleeding.  Musculoskeletal:  Negative for back pain.  Neurological:  Negative for syncope, light-headedness and headaches.  Psychiatric/Behavioral:  Negative for dysphoric mood.    Objective:  BP 128/74   Pulse 68   Temp 98.9 F (37.2 C) (Oral)   Ht 5' 4 (1.626 m)   Wt 155 lb 12.8 oz (70.7 kg)   SpO2 98%   BMI 26.74 kg/m   Wt Readings from Last 3 Encounters:  02/27/24 155 lb 12.8 oz (70.7 kg)  11/27/23 200 lb 2.9 oz (90.8 kg)  11/08/23 200 lb 2.8 oz (90.8 kg)      Physical Exam Exam conducted with a chaperone present.  Constitutional:      General: She is not in acute distress.    Appearance: Normal appearance. She is well-developed. She is not ill-appearing or toxic-appearing.  HENT:     Head: Normocephalic.     Right Ear: Hearing, tympanic membrane, ear canal and external ear normal. Tympanic membrane is not  erythematous, retracted or bulging.     Left Ear: Hearing, tympanic membrane, ear canal and external ear normal. Tympanic membrane is not erythematous, retracted or bulging.     Nose: No mucosal edema or rhinorrhea.     Right Sinus: No maxillary sinus tenderness or frontal sinus tenderness.  Left Sinus: No maxillary sinus tenderness or frontal sinus tenderness.     Mouth/Throat:     Pharynx: Uvula midline.  Eyes:     General: Lids are normal. Lids are everted, no foreign bodies appreciated.     Conjunctiva/sclera: Conjunctivae normal.     Pupils: Pupils are equal, round, and reactive to light.  Neck:     Thyroid : No thyroid  mass or thyromegaly.     Vascular: No carotid bruit.     Trachea: Trachea normal.  Cardiovascular:     Rate and Rhythm: Normal rate and regular rhythm.     Pulses: Normal pulses.     Heart sounds: Normal heart sounds, S1 normal and S2 normal. No murmur heard.    No friction rub. No gallop.  Pulmonary:     Effort: Pulmonary effort is normal. No tachypnea or respiratory distress.     Breath sounds: Normal breath sounds. No decreased breath sounds, wheezing, rhonchi or rales.  Abdominal:     General: Bowel sounds are normal.     Palpations: Abdomen is soft.     Tenderness: There is no abdominal tenderness.     Hernia: There is no hernia in the left inguinal area or right inguinal area.  Genitourinary:    Exam position: Supine.     Pubic Area: No rash.      Labia:        Right: No rash, tenderness, lesion or injury.        Left: No rash, tenderness, lesion or injury.      Urethra: No prolapse, urethral pain, urethral swelling or urethral lesion.     Vagina: No signs of injury and foreign body. Vaginal discharge present. No erythema, tenderness, bleeding, lesions or prolapsed vaginal walls.     Cervix: No discharge, friability, erythema or cervical bleeding.     Uterus: Normal. Not deviated, not enlarged, not fixed, not tender and no uterine prolapse.       Adnexa: Right adnexa normal and left adnexa normal.     Rectum: Normal.  Musculoskeletal:     Cervical back: Normal range of motion and neck supple.  Skin:    General: Skin is warm and dry.     Findings: No rash.  Neurological:     Mental Status: She is alert.  Psychiatric:        Mood and Affect: Mood is not anxious or depressed.        Speech: Speech normal.        Behavior: Behavior normal. Behavior is cooperative.        Thought Content: Thought content normal.        Judgment: Judgment normal.       Results for orders placed or performed in visit on 02/27/24  POCT Urinalysis Dipstick (Automated)   Collection Time: 02/27/24 11:36 AM  Result Value Ref Range   Color, UA Amber    Clarity, UA CLEAR    Glucose, UA Negative Negative   Bilirubin, UA NEGATIVE    Ketones, UA NEGATIVE    Spec Grav, UA 1.015 1.010 - 1.025   Blood, UA NEGATIVE    pH, UA 6.0 5.0 - 8.0   Protein, UA Negative Negative   Urobilinogen, UA negative (A) 0.2 or 1.0 E.U./dL   Nitrite, UA NEGATIVE    Leukocytes, UA Small (1+) (A) Negative    Assessment and Plan  Vaginal discharge Assessment & Plan: Acute, unclear if discharged seen vaginal versus urinary.  Urinalysis in office today shows  some white blood cells and given she is slightly symptomatic with mild burning we will send the urine for culture.  On exam there was copious yellow thick mucus in vaginal vault, no visualized blood coming from the cervical os, no vaginal or cervical lesions.  Normal uterine exam.  Will send wet prep to rule out bacterial or yeast infection. If negative we will reconsider possibility of postmenopausal bleeding.  Orders: -     WET PREP BY MOLECULAR PROBE  Post-menopausal bleeding Assessment & Plan: Brownish vaginal discharge could but possibly be postmenopausal bleeding.  If negative vaginal discharge workup will consider ultrasound and referral to GYN.  Orders: -     POCT Urinalysis Dipstick  (Automated)    No follow-ups on file.   Greig Ring, MD

## 2024-02-27 NOTE — Assessment & Plan Note (Signed)
 Acute, unclear if discharged seen vaginal versus urinary.  Urinalysis in office today shows some white blood cells and given she is slightly symptomatic with mild burning we will send the urine for culture.  On exam there was copious yellow thick mucus in vaginal vault, no visualized blood coming from the cervical os, no vaginal or cervical lesions.  Normal uterine exam.  Will send wet prep to rule out bacterial or yeast infection. If negative we will reconsider possibility of postmenopausal bleeding.

## 2024-02-27 NOTE — Assessment & Plan Note (Signed)
 Brownish vaginal discharge could but possibly be postmenopausal bleeding.  If negative vaginal discharge workup will consider ultrasound and referral to GYN.

## 2024-02-28 ENCOUNTER — Ambulatory Visit: Payer: Self-pay | Admitting: Family Medicine

## 2024-02-28 LAB — WET PREP BY MOLECULAR PROBE
Candida species: NOT DETECTED
Gardnerella vaginalis: NOT DETECTED
MICRO NUMBER:: 17011267
SPECIMEN QUALITY:: ADEQUATE
Trichomonas vaginosis: NOT DETECTED

## 2024-02-29 ENCOUNTER — Other Ambulatory Visit: Payer: Self-pay | Admitting: Medical Genetics

## 2024-03-06 ENCOUNTER — Other Ambulatory Visit: Payer: Self-pay | Admitting: Family Medicine

## 2024-03-06 DIAGNOSIS — N950 Postmenopausal bleeding: Secondary | ICD-10-CM

## 2024-03-07 ENCOUNTER — Encounter: Payer: Self-pay | Admitting: Family Medicine

## 2024-03-07 ENCOUNTER — Ambulatory Visit (INDEPENDENT_AMBULATORY_CARE_PROVIDER_SITE_OTHER): Admitting: Family Medicine

## 2024-03-07 ENCOUNTER — Encounter: Payer: Self-pay | Admitting: *Deleted

## 2024-03-07 VITALS — BP 120/60 | HR 76 | Temp 98.2°F | Ht 64.0 in | Wt 157.4 lb

## 2024-03-07 DIAGNOSIS — N950 Postmenopausal bleeding: Secondary | ICD-10-CM

## 2024-03-07 LAB — POC URINALSYSI DIPSTICK (AUTOMATED)
Bilirubin, UA: NEGATIVE
Blood, UA: NEGATIVE
Glucose, UA: NEGATIVE
Ketones, UA: NEGATIVE
Leukocytes, UA: NEGATIVE
Nitrite, UA: NEGATIVE
Protein, UA: NEGATIVE
Spec Grav, UA: 1.01 (ref 1.010–1.025)
Urobilinogen, UA: 0.2 U/dL
pH, UA: 6 (ref 5.0–8.0)

## 2024-03-07 NOTE — Progress Notes (Signed)
 Patient ID: Christy Freeman, female    DOB: Dec 13, 1950, 73 y.o.   MRN: [id number]  This visit was conducted in person.  BP 120/60   Pulse 76   Temp 98.2 F (36.8 C) (Temporal)   Ht 5' 4 (1.626 m)   Wt 157 lb 6 oz (71.4 kg)   SpO2 98%   BMI 27.01 kg/m    CC:  Chief Complaint  Patient presents with   Vaginal Discharge    Subjective:   HPI: Christy Freeman is a 73 y.o. female presenting on 03/07/2024 for Vaginal Discharge   Pt seen recently for  dark vaginal discharge.. possible  postmenopausal bleeding.  Wet prep was negative.  Referred to GYN and ordered transvaginal US .   She has not noted any further  bloody discharge, no vaginal discharge at all.  UA at last OV 9/24 small LE, neg blood and neg nitrite... no urinary symptoms.       Relevant past medical, surgical, family and social history reviewed and updated as indicated. Interim medical history since our last visit reviewed. Allergies and medications reviewed and updated. Outpatient Medications Prior to Visit  Medication Sig Dispense Refill   Alpha-Lipoic Acid 200 MG CAPS Take by mouth.     ALPRAZolam  (XANAX ) 0.25 MG tablet Take 1 tablet (0.25 mg total) by mouth daily as needed for anxiety. 30 tablet 0   amLODipine  (NORVASC ) 10 MG tablet TAKE 1 TABLET(10 MG) BY MOUTH DAILY 90 tablet 3   Ascorbic Acid (VITAMIN C PO) Take by mouth. 40 mg     aspirin EC 81 MG tablet Take 81 mg by mouth daily. Swallow whole.     carvedilol  (COREG ) 25 MG tablet Take 1 tablet (25 mg total) by mouth 2 (two) times daily. 180 tablet 3   cetirizine (ZYRTEC) 10 MG tablet Take 10 mg by mouth daily.     Cholecalciferol (VITAMIN D3 PO) Take by mouth. 1000 units 2 times daily     Coenzyme Q10 (COQ10) 100 MG CAPS Take by mouth.     doxazosin  (CARDURA ) 1 MG tablet Take 1 tablet (1 mg total) by mouth daily as needed. Take for blood pressure greater then 160. 90 tablet 3   ezetimibe  (ZETIA ) 10 MG tablet Take 1 tablet (10 mg total) by mouth  daily. 90 tablet 0   Folic Acid (FOLATE PO) Take by mouth. 400 mg     lisinopril  (ZESTRIL ) 40 MG tablet TAKE 1 TABLET(40 MG) BY MOUTH DAILY 100 tablet 0   MAGNESIUM PO Take 425 mg by mouth 2 (two) times daily.     Omega-3 Fatty Acids (OMEGA 3 PO) Take by mouth. 1280 mg     omeprazole  (PRILOSEC) 10 MG capsule TAKE 1 CAPSULE(10 MG) BY MOUTH DAILY 90 capsule 0   Probiotic Product (PROBIOTIC DAILY PO) Take 1 capsule by mouth in the morning, at noon, and at bedtime.     rosuvastatin  (CRESTOR ) 10 MG tablet TAKE 1 TABLET(10 MG) BY MOUTH DAILY 90 tablet 3   thiamine (VITAMIN B-1) 100 MG tablet Take 100 mg by mouth daily.     tirzepatide  (ZEPBOUND ) 12.5 MG/0.5ML Pen Inject 12.5 mg into the skin once a week. 2 mL 1   venlafaxine  XR (EFFEXOR -XR) 150 MG 24 hr capsule TAKE 1 CAPSULE(150 MG) BY MOUTH DAILY 90 capsule 1   vitamin B-12 (CYANOCOBALAMIN) 1000 MCG tablet Take 1,000 mcg by mouth daily.     No facility-administered medications prior to visit.  Per HPI unless specifically indicated in ROS section below Review of Systems  Constitutional:  Negative for fatigue and fever.  HENT:  Negative for congestion.   Eyes:  Negative for pain.  Respiratory:  Negative for cough and shortness of breath.   Cardiovascular:  Negative for chest pain, palpitations and leg swelling.  Gastrointestinal:  Negative for abdominal pain.  Genitourinary:  Negative for dysuria and vaginal bleeding.  Musculoskeletal:  Negative for back pain.  Neurological:  Negative for syncope, light-headedness and headaches.  Psychiatric/Behavioral:  Negative for dysphoric mood.    Objective:  BP 120/60   Pulse 76   Temp 98.2 F (36.8 C) (Temporal)   Ht 5' 4 (1.626 m)   Wt 157 lb 6 oz (71.4 kg)   SpO2 98%   BMI 27.01 kg/m   Wt Readings from Last 3 Encounters:  03/07/24 157 lb 6 oz (71.4 kg)  02/27/24 155 lb 12.8 oz (70.7 kg)  11/27/23 200 lb 2.9 oz (90.8 kg)      Physical Exam Constitutional:      General: She is not  in acute distress.    Appearance: Normal appearance. She is well-developed. She is not ill-appearing or toxic-appearing.  HENT:     Head: Normocephalic.     Right Ear: Hearing, tympanic membrane, ear canal and external ear normal. Tympanic membrane is not erythematous, retracted or bulging.     Left Ear: Hearing, tympanic membrane, ear canal and external ear normal. Tympanic membrane is not erythematous, retracted or bulging.     Nose: No mucosal edema or rhinorrhea.     Right Sinus: No maxillary sinus tenderness or frontal sinus tenderness.     Left Sinus: No maxillary sinus tenderness or frontal sinus tenderness.     Mouth/Throat:     Pharynx: Uvula midline.  Eyes:     General: Lids are normal. Lids are everted, no foreign bodies appreciated.     Conjunctiva/sclera: Conjunctivae normal.     Pupils: Pupils are equal, round, and reactive to light.  Neck:     Thyroid : No thyroid  mass or thyromegaly.     Vascular: No carotid bruit.     Trachea: Trachea normal.  Cardiovascular:     Rate and Rhythm: Normal rate and regular rhythm.     Pulses: Normal pulses.     Heart sounds: Normal heart sounds, S1 normal and S2 normal. No murmur heard.    No friction rub. No gallop.  Pulmonary:     Effort: Pulmonary effort is normal. No tachypnea or respiratory distress.     Breath sounds: Normal breath sounds. No decreased breath sounds, wheezing, rhonchi or rales.  Abdominal:     General: Bowel sounds are normal.     Palpations: Abdomen is soft.     Tenderness: There is no abdominal tenderness.  Musculoskeletal:     Cervical back: Normal range of motion and neck supple.  Skin:    General: Skin is warm and dry.     Findings: No rash.  Neurological:     Mental Status: She is alert.  Psychiatric:        Mood and Affect: Mood is not anxious or depressed.        Speech: Speech normal.        Behavior: Behavior normal. Behavior is cooperative.        Thought Content: Thought content normal.         Judgment: Judgment normal.       Results for orders placed  or performed in visit on 03/07/24  POCT Urinalysis Dipstick (Automated)   Collection Time: 03/07/24 10:14 AM  Result Value Ref Range   Color, UA Yellow    Clarity, UA Clear    Glucose, UA Negative Negative   Bilirubin, UA Negative    Ketones, UA Negative    Spec Grav, UA 1.010 1.010 - 1.025   Blood, UA Negative    pH, UA 6.0 5.0 - 8.0   Protein, UA Negative Negative   Urobilinogen, UA 0.2 0.2 or 1.0 E.U./dL   Nitrite, UA Negative    Leukocytes, UA Negative Negative    Assessment and Plan  POSSIBLE Post-menopausal bleeding Assessment & Plan: Acute, no further vaginal discharge or bleeding. Wet prep unremarkable. Leukocytes seen on previous urinalysis resolved on today's urinalysis.  Will send urine for culture to make sure no bladder infection given she does have a feeling of pressure in her suprapubic region at times.  We will proceed with ultrasound pelvic/transvaginal.  I have placed a referral to gynecology.  Orders: -     POCT Urinalysis Dipstick (Automated) -     Urine Culture     No follow-ups on file.   Greig Ring, MD

## 2024-03-07 NOTE — Assessment & Plan Note (Signed)
 Acute, no further vaginal discharge or bleeding. Wet prep unremarkable. Leukocytes seen on previous urinalysis resolved on today's urinalysis.  Will send urine for culture to make sure no bladder infection given she does have a feeling of pressure in her suprapubic region at times.  We will proceed with ultrasound pelvic/transvaginal.  I have placed a referral to gynecology.

## 2024-03-08 LAB — URINE CULTURE
MICRO NUMBER:: 17053941
Result:: NO GROWTH
SPECIMEN QUALITY:: ADEQUATE

## 2024-03-11 ENCOUNTER — Ambulatory Visit: Payer: Self-pay | Admitting: Family Medicine

## 2024-03-12 ENCOUNTER — Ambulatory Visit
Admission: RE | Admit: 2024-03-12 | Discharge: 2024-03-12 | Disposition: A | Discharge status: Home / Self Care | Source: Ambulatory Visit | Attending: Family Medicine | Admitting: Family Medicine

## 2024-03-12 DIAGNOSIS — N950 Postmenopausal bleeding: Secondary | ICD-10-CM | POA: Insufficient documentation

## 2024-03-19 ENCOUNTER — Ambulatory Visit: Payer: Self-pay | Admitting: Family Medicine

## 2024-03-25 ENCOUNTER — Other Ambulatory Visit: Payer: Self-pay | Admitting: Family Medicine

## 2024-03-25 DIAGNOSIS — K2100 Gastro-esophageal reflux disease with esophagitis, without bleeding: Secondary | ICD-10-CM

## 2024-03-25 DIAGNOSIS — I10 Essential (primary) hypertension: Secondary | ICD-10-CM

## 2024-03-29 ENCOUNTER — Other Ambulatory Visit: Payer: Self-pay | Admitting: Physician Assistant

## 2024-04-02 ENCOUNTER — Other Ambulatory Visit: Payer: Self-pay | Admitting: Cardiovascular Disease

## 2024-04-23 ENCOUNTER — Other Ambulatory Visit

## 2024-04-23 DIAGNOSIS — Z006 Encounter for examination for normal comparison and control in clinical research program: Secondary | ICD-10-CM

## 2024-04-24 ENCOUNTER — Encounter: Payer: Self-pay | Admitting: Registered Nurse

## 2024-04-24 ENCOUNTER — Ambulatory Visit: Admitting: Registered Nurse

## 2024-04-24 ENCOUNTER — Other Ambulatory Visit (HOSPITAL_COMMUNITY)
Admission: RE | Admit: 2024-04-24 | Discharge: 2024-04-24 | Disposition: A | Source: Ambulatory Visit | Attending: Registered Nurse | Admitting: Registered Nurse

## 2024-04-24 VITALS — BP 136/63 | HR 68 | Ht 64.0 in | Wt 152.0 lb

## 2024-04-24 DIAGNOSIS — N898 Other specified noninflammatory disorders of vagina: Secondary | ICD-10-CM | POA: Diagnosis not present

## 2024-04-24 DIAGNOSIS — N958 Other specified menopausal and perimenopausal disorders: Secondary | ICD-10-CM | POA: Diagnosis not present

## 2024-04-24 DIAGNOSIS — H43813 Vitreous degeneration, bilateral: Secondary | ICD-10-CM | POA: Diagnosis not present

## 2024-04-24 DIAGNOSIS — H2513 Age-related nuclear cataract, bilateral: Secondary | ICD-10-CM | POA: Diagnosis not present

## 2024-04-24 DIAGNOSIS — H02831 Dermatochalasis of right upper eyelid: Secondary | ICD-10-CM | POA: Diagnosis not present

## 2024-04-24 MED ORDER — ESTRADIOL 0.01 % VA CREA: 0.2500 | TOPICAL_CREAM | VAGINAL | 3 refills | Status: AC

## 2024-04-24 NOTE — Progress Notes (Signed)
 GYN ENCOUNTER  Subjective  HPI: Christy Freeman is a 73 y.o. G3P0030 who presents today for follow up. She thought she might have a yeast infection and used monistat (OTC), and felt better after that. She had some brownish discharge on 02/20/24, which was about a week later. She has not noticed that discharge again. She had a pelvic ultrasound on 10/18, which showed an endometrial stripe of 2mm, and normal uterus. She is sexually active with her husband. They use lubricant with penetration. Sex is comfortable/ non-painful for her.   Past Medical History:  Diagnosis Date   Allergy    seasonal allergies   Anxiety    hx- on meds   Arthritis    Right hand   Blood transfusion without reported diagnosis 1989   Cancer (HCC)    basal cell on face   Cataract    Colon polyps    GERD (gastroesophageal reflux disease)    H/O: depression    Hypercholesterolemia    diet controlled   Hypertension    Lung nodule    left lung    Psoriasis    Past Surgical History:  Procedure Laterality Date   APPENDECTOMY  06/14/2017   BREAST BIOPSY Right 2000   benign   COLONOSCOPY  2016   TA x 1   CYST REMOVAL HAND Left    DILATION AND CURETTAGE OF UTERUS     LAPAROSCOPIC APPENDECTOMY N/A 06/14/2017   Procedure: APPENDECTOMY LAPAROSCOPIC;  Surgeon: Shelva Dunnings, MD;  Location: [facility] ORS;  Service: General;  Laterality: N/A;   POLYPECTOMY  2016   TA   OB History     Gravida  3   Para      Term      Preterm      AB  3   Living         SAB  2   IAB      Ectopic      Multiple      Live Births             No Known Allergies  Constitutional: Denied constitutional symptoms, night sweats, recent illness, fatigue, fever, insomnia and weight loss.  Eyes: Denied eye symptoms, eye pain, photophobia, vision change and visual disturbance.  Ears/Nose/Throat/Neck: Denied ear, nose, throat or neck symptoms, hearing loss, nasal discharge, sinus congestion and sore throat.   Cardiovascular: Denied cardiovascular symptoms, arrhythmia, chest pain/pressure, edema, exercise intolerance, orthopnea and palpitations.  Respiratory: Denied pulmonary symptoms, asthma, pleuritic pain, productive sputum, cough, dyspnea and wheezing.  Gastrointestinal: Denied, gastro-esophageal reflux, melena, nausea and vomiting.  Genitourinary: Denied genitourinary symptoms including current symptomatic vaginal discharge, pelvic relaxation issues, and urinary complaints.  Musculoskeletal: Denied musculoskeletal symptoms, stiffness, swelling, muscle weakness and myalgia.  Dermatologic: Denied dermatology symptoms, rash and scar.  Neurologic: Denied neurology symptoms, dizziness, headache, neck pain and syncope.  Psychiatric: Denied psychiatric symptoms, anxiety and depression.  Endocrine: Denied endocrine symptoms including hot flashes and night sweats.    Objective  BP 136/63   Pulse 68   Ht 5' 4 (1.626 m)   Wt 152 lb (68.9 kg)   BMI 26.09 kg/m   Physical examination   Pelvic:   Vulva: Pale appearance.  No lesions.  Vagina: No lesions or abnormalities noted. Pale, thin mucosa. Atrophic.   Support: Normal pelvic support.  Urethra No masses tenderness or scarring.  Meatus Normal size without lesions or prolapse.  Cervix: Normal appearance.  No lesions. Small amount tan discharge.   Anus:  Normal exam.  No lesions.  Perineum: Normal exam.  No lesions.    Assessment/ Plan: Patient currently asymptomatic. Has not noticed vaginal discharge or discomfort in almost 2 months. Has had normal pelvic ultrasound and endometrial stripe findings in October. Physical exam is consistent with genitourinary syndrome of menopause. Swab collected to assess for yeast or BV. Offered pt vaginal estrogen. She accepts. This was sent to her pharmacy. She will return with questions/ concerns or if she has any pink or red bleeding.     Lauraine Lakes, CNM

## 2024-04-25 DIAGNOSIS — M1990 Unspecified osteoarthritis, unspecified site: Secondary | ICD-10-CM | POA: Diagnosis not present

## 2024-04-25 DIAGNOSIS — F419 Anxiety disorder, unspecified: Secondary | ICD-10-CM | POA: Diagnosis not present

## 2024-04-29 ENCOUNTER — Ambulatory Visit: Payer: Self-pay | Admitting: Registered Nurse

## 2024-04-29 DIAGNOSIS — L2089 Other atopic dermatitis: Secondary | ICD-10-CM | POA: Diagnosis not present

## 2024-04-29 DIAGNOSIS — D2261 Melanocytic nevi of right upper limb, including shoulder: Secondary | ICD-10-CM | POA: Diagnosis not present

## 2024-04-29 DIAGNOSIS — Z872 Personal history of diseases of the skin and subcutaneous tissue: Secondary | ICD-10-CM | POA: Diagnosis not present

## 2024-04-29 DIAGNOSIS — D485 Neoplasm of uncertain behavior of skin: Secondary | ICD-10-CM | POA: Diagnosis not present

## 2024-04-29 DIAGNOSIS — Z85828 Personal history of other malignant neoplasm of skin: Secondary | ICD-10-CM | POA: Diagnosis not present

## 2024-04-29 DIAGNOSIS — L578 Other skin changes due to chronic exposure to nonionizing radiation: Secondary | ICD-10-CM | POA: Diagnosis not present

## 2024-04-29 LAB — CERVICOVAGINAL ANCILLARY ONLY
Bacterial Vaginitis (gardnerella): NEGATIVE
Candida Glabrata: NEGATIVE
Candida Vaginitis: NEGATIVE
Comment: NEGATIVE
Comment: NEGATIVE
Comment: NEGATIVE

## 2024-05-05 LAB — GENECONNECT MOLECULAR SCREEN: Genetic Analysis Overall Interpretation: NEGATIVE

## 2024-05-07 DIAGNOSIS — K080 Exfoliation of teeth due to systemic causes: Secondary | ICD-10-CM | POA: Diagnosis not present

## 2024-05-12 ENCOUNTER — Other Ambulatory Visit: Payer: Self-pay | Admitting: Cardiovascular Disease

## 2024-05-13 ENCOUNTER — Inpatient Hospital Stay: Admission: RE | Admit: 2024-05-13 | Discharge: 2024-05-13 | Attending: Family Medicine | Admitting: Family Medicine

## 2024-05-13 DIAGNOSIS — Z1231 Encounter for screening mammogram for malignant neoplasm of breast: Secondary | ICD-10-CM

## 2024-05-14 ENCOUNTER — Ambulatory Visit: Admitting: Family Medicine

## 2024-05-14 VITALS — BP 130/76 | HR 71 | Temp 98.4°F | Ht 64.0 in | Wt 148.4 lb

## 2024-05-14 DIAGNOSIS — E559 Vitamin D deficiency, unspecified: Secondary | ICD-10-CM | POA: Diagnosis not present

## 2024-05-14 DIAGNOSIS — G4733 Obstructive sleep apnea (adult) (pediatric): Secondary | ICD-10-CM

## 2024-05-14 DIAGNOSIS — Z0000 Encounter for general adult medical examination without abnormal findings: Secondary | ICD-10-CM | POA: Diagnosis not present

## 2024-05-14 DIAGNOSIS — I700 Atherosclerosis of aorta: Secondary | ICD-10-CM

## 2024-05-14 DIAGNOSIS — E785 Hyperlipidemia, unspecified: Secondary | ICD-10-CM

## 2024-05-14 DIAGNOSIS — M8580 Other specified disorders of bone density and structure, unspecified site: Secondary | ICD-10-CM | POA: Diagnosis not present

## 2024-05-14 DIAGNOSIS — I10 Essential (primary) hypertension: Secondary | ICD-10-CM | POA: Diagnosis not present

## 2024-05-14 DIAGNOSIS — F321 Major depressive disorder, single episode, moderate: Secondary | ICD-10-CM | POA: Diagnosis not present

## 2024-05-14 DIAGNOSIS — K7581 Nonalcoholic steatohepatitis (NASH): Secondary | ICD-10-CM | POA: Diagnosis not present

## 2024-05-14 LAB — COMPREHENSIVE METABOLIC PANEL WITH GFR
ALT: 32 U/L (ref 0–35)
AST: 34 U/L (ref 0–37)
Albumin: 4.7 g/dL (ref 3.5–5.2)
Alkaline Phosphatase: 149 U/L — ABNORMAL HIGH (ref 39–117)
BUN: 11 mg/dL (ref 6–23)
CO2: 30 meq/L (ref 19–32)
Calcium: 10.3 mg/dL (ref 8.4–10.5)
Chloride: 96 meq/L (ref 96–112)
Creatinine, Ser: 0.55 mg/dL (ref 0.40–1.20)
GFR: 90.66 mL/min (ref >60.00)
Glucose, Bld: 91 mg/dL (ref 70–99)
Potassium: 5.2 meq/L — ABNORMAL HIGH (ref 3.5–5.1)
Sodium: 133 meq/L — ABNORMAL LOW (ref 135–145)
Total Bilirubin: 0.6 mg/dL (ref 0.2–1.2)
Total Protein: 8 g/dL (ref 6.0–8.3)

## 2024-05-14 LAB — LIPID PANEL
Cholesterol: 146 mg/dL (ref 0–200)
HDL: 82.4 mg/dL (ref >39.00)
LDL Cholesterol: 51 mg/dL (ref 0–99)
NonHDL: 63.87
Total CHOL/HDL Ratio: 2
Triglycerides: 65 mg/dL (ref 0.0–149.0)
VLDL: 13 mg/dL (ref 0.0–40.0)

## 2024-05-14 LAB — VITAMIN D 25 HYDROXY (VIT D DEFICIENCY, FRACTURES): VITD: 84.04 ng/mL (ref 30.00–100.00)

## 2024-05-14 NOTE — Progress Notes (Signed)
 Patient ID: Christy Freeman, female    DOB: 1950-09-05, 73 y.o.   MRN: [id number]  This visit was conducted in person.  BP 130/76   Pulse 71   Temp 98.4 F (36.9 C)   Ht 5' 4 (1.626 m)   Wt 148 lb 6.4 oz (67.3 kg)   SpO2 97%   BMI 25.47 kg/m    CC: Chief Complaint  Patient presents with   Annual Exam    Subjective:   HPI: Christy Freeman is a 73 y.o. female presenting on 05/14/2024 for Annual Exam  The patient presents for complete physical and review of chronic health problems. He/She also has the following acute concerns today:    No further vaginal/ urinary bleeding  Using estrogen vaginal cream.   Negative [employer] eval including BRCA1/2  04/2024 The patient saw a LPN or RN for medicare wellness visit.  May 08, 2024  Prevention and wellness was reviewed in detail. Note reviewed and important notes copied below.   Aortic atherosclerosis  followed by Dr. Gollan  On crestor /zetia .  Due for re-eval lipids. Lab Results  Component Value Date   CHOL 171 05/16/2023   HDL 67.50 05/16/2023   LDLCALC 77 05/16/2023   LDLDIRECT 191.0 07/30/2014   TRIG 132.0 05/16/2023   CHOLHDL 3 05/16/2023     Fatty liver/NASH: re-eval with liver function tests.   GERD with esophagitis: stable on prilosec   MDD: stable control on venlafaxine .  Caregiver stress:She has been under an extreme amount of caregiving stress with her husband possible dementia diagnosis. Flowsheet Row Office Visit from 05/14/2024 in [facility] Total Score 0      Wt Readings from Last 3 Encounters:  05/14/24 148 lb 6.4 oz (67.3 kg)  04/24/24 152 lb (68.9 kg)  03/07/24 157 lb 6 oz (71.4 kg)  Body mass index is 25.47 kg/m. She has been doing keto, Exercising daily and resistance bands.    Hypertension: well controlled on current regimen. BP Readings from Last 3 Encounters:  05/14/24 130/76  04/24/24 136/63  03/07/24 120/60  Using  medication without problems or lightheadedness:  none Chest pain with exertion:none Edema:none Short of breath: none  Average home BPs: 128/68 Other issues:   On Zepbound  15 mg weekly.. successful weight loss and CAD protection.   Relevant past medical, surgical, family and social history reviewed and updated as indicated. Interim medical history since our last visit reviewed. Allergies and medications reviewed and updated. Outpatient Medications Prior to Visit  Medication Sig Dispense Refill   Alpha-Lipoic Acid 200 MG CAPS Take by mouth.     ALPRAZolam  (XANAX ) 0.25 MG tablet Take 1 tablet (0.25 mg total) by mouth daily as needed for anxiety. 30 tablet 0   amLODipine  (NORVASC ) 10 MG tablet TAKE 1 TABLET(10 MG) BY MOUTH DAILY 90 tablet 0   Ascorbic Acid (VITAMIN C PO) Take by mouth. 40 mg     aspirin EC 81 MG tablet Take 81 mg by mouth daily. Swallow whole.     carvedilol  (COREG ) 25 MG tablet TAKE 1 TABLET(25 MG) BY MOUTH TWICE DAILY 180 tablet 1   cetirizine (ZYRTEC) 10 MG tablet Take 10 mg by mouth daily.     Cholecalciferol (VITAMIN D3 PO) Take by mouth. 1000 units 2 times daily     Coenzyme Q10 (COQ10) 100 MG CAPS Take by mouth.     doxazosin  (CARDURA ) 1 MG tablet TAKE 1 TABLET BY MOUTH DAILY AS  NEEDED FOR BLOOD PRESSURE GREATER THAN 160 90 tablet 0   estradiol  (ESTRACE ) 0.01 % CREA vaginal cream Place 0.25 Applicatorfuls vaginally 3 (three) times a week. 90 g 3   ezetimibe  (ZETIA ) 10 MG tablet Take 1 tablet (10 mg total) by mouth daily. 90 tablet 0   Folic Acid (FOLATE PO) Take by mouth. 400 mg     lisinopril  (ZESTRIL ) 40 MG tablet TAKE 1 TABLET(40 MG) BY MOUTH DAILY 100 tablet 0   MAGNESIUM PO Take 425 mg by mouth 2 (two) times daily.     Omega-3 Fatty Acids (OMEGA 3 PO) Take by mouth. 1280 mg     omeprazole  (PRILOSEC) 10 MG capsule TAKE 1 CAPSULE(10 MG) BY MOUTH DAILY 90 capsule 0   Probiotic Product (PROBIOTIC DAILY PO) Take 1 capsule by mouth in the morning, at noon, and at  bedtime.     rosuvastatin  (CRESTOR ) 10 MG tablet TAKE 1 TABLET(10 MG) BY MOUTH DAILY 90 tablet 0   thiamine (VITAMIN B-1) 100 MG tablet Take 100 mg by mouth daily.     tirzepatide  (ZEPBOUND ) 15 MG/0.5ML Pen Inject 15 mg into the skin once a week. 2 mL 11   venlafaxine  XR (EFFEXOR -XR) 150 MG 24 hr capsule TAKE 1 CAPSULE(150 MG) BY MOUTH DAILY 90 capsule 1   vitamin B-12 (CYANOCOBALAMIN) 1000 MCG tablet Take 1,000 mcg by mouth daily.     No facility-administered medications prior to visit.     Per HPI unless specifically indicated in ROS section below Review of Systems  Constitutional:  Negative for fatigue and fever.  HENT:  Negative for congestion.   Eyes:  Negative for pain.  Respiratory:  Negative for cough and shortness of breath.   Cardiovascular:  Negative for chest pain, palpitations and leg swelling.  Gastrointestinal:  Negative for abdominal pain.  Genitourinary:  Negative for dysuria and vaginal bleeding.  Musculoskeletal:  Negative for back pain.  Neurological:  Negative for syncope, light-headedness and headaches.  Psychiatric/Behavioral:  Negative for dysphoric mood.    Objective:  BP 130/76   Pulse 71   Temp 98.4 F (36.9 C)   Ht 5' 4 (1.626 m)   Wt 148 lb 6.4 oz (67.3 kg)   SpO2 97%   BMI 25.47 kg/m   Wt Readings from Last 3 Encounters:  05/14/24 148 lb 6.4 oz (67.3 kg)  04/24/24 152 lb (68.9 kg)  03/07/24 157 lb 6 oz (71.4 kg)      Physical Exam Vitals and nursing note reviewed.  Constitutional:      General: She is not in acute distress.    Appearance: Normal appearance. She is well-developed. She is not ill-appearing or toxic-appearing.  HENT:     Head: Normocephalic.     Right Ear: Hearing, tympanic membrane, ear canal and external ear normal.     Left Ear: Hearing, tympanic membrane, ear canal and external ear normal.     Nose: Nose normal.  Eyes:     General: Lids are normal. Lids are everted, no foreign bodies appreciated.      Conjunctiva/sclera: Conjunctivae normal.     Pupils: Pupils are equal, round, and reactive to light.  Neck:     Thyroid : No thyroid  mass or thyromegaly.     Vascular: No carotid bruit.     Trachea: Trachea normal.  Cardiovascular:     Rate and Rhythm: Normal rate and regular rhythm.     Heart sounds: Normal heart sounds, S1 normal and S2 normal. No murmur heard.  No gallop.  Pulmonary:     Effort: Pulmonary effort is normal. No respiratory distress.     Breath sounds: Normal breath sounds. No wheezing, rhonchi or rales.  Abdominal:     General: Bowel sounds are normal. There is no distension or abdominal bruit.     Palpations: Abdomen is soft. There is no fluid wave or mass.     Tenderness: There is no abdominal tenderness. There is no guarding or rebound.     Hernia: No hernia is present.  Musculoskeletal:     Cervical back: Normal range of motion and neck supple.  Lymphadenopathy:     Cervical: No cervical adenopathy.  Skin:    General: Skin is warm and dry.     Findings: No rash.     Comments: Healing biopsy on right upper forearm, indentation on left upper forehead  Neurological:     Mental Status: She is alert.     Cranial Nerves: No cranial nerve deficit.     Sensory: No sensory deficit.  Psychiatric:        Mood and Affect: Mood is not anxious or depressed.        Speech: Speech normal.        Behavior: Behavior normal. Behavior is cooperative.        Judgment: Judgment normal.       Results for orders placed or performed in visit on 04/24/24  Cervicovaginal ancillary only   Collection Time: 04/24/24  2:47 PM  Result Value Ref Range   Bacterial Vaginitis (gardnerella) Negative    Candida Vaginitis Negative    Candida Glabrata Negative    Comment      Normal Reference Range Bacterial Vaginosis - Negative   Comment Normal Reference Range Candida Species - Negative    Comment Normal Reference Range Candida Galbrata - Negative     This visit occurred during the  SARS-CoV-2 public health emergency.  Safety protocols were in place, including screening questions prior to the visit, additional usage of staff PPE, and extensive cleaning of exam room while observing appropriate contact time as indicated for disinfecting solutions.   COVID 19 screen:  No recent travel or known exposure to COVID19 The patient denies respiratory symptoms of COVID 19 at this time. The importance of social distancing was discussed today.   Assessment and Plan   The patient's preventative maintenance and recommended screening tests for an annual wellness exam were reviewed in full today. Brought up to date unless services declined.  Counselled on the importance of diet, exercise, and its role in overall health and mortality. The patient's FH and SH was reviewed, including their home life, tobacco status, and drug and alcohol status.   Vaccines:S/P 5 COVID vaccines, PNA and  has had flu, due for td Pap/DVE: not indicated, no HPV and neg in  2019 Mammo: 12/ 2025.. pending. Bone Density:osteopenia 2019, 06/2022 T-3.1 in forearm, other areas osteopenia Colon:01/22/2020, repeat in 3 years, Dr. Aneita, 02/07/2023  Smoking Status:none ETOH/ drug use:1 a day/none  Hep C: done  Problem List Items Addressed This Visit     Aortic atherosclerosis   On statin Followed by cardiology.      Hyperlipidemia    Chronic, due for re-eval.  Rosuvastatin  10 mg daily Zetia  10 mg p.o. daily      Relevant Orders   Lipid panel   Comprehensive metabolic panel with GFR   Hypertension   Chronic, well controlled  Max lisinopril  40 mg daily, max amlodipine  10 mg  daily, coreg25 mg BID  and  cardura  prn       MDD (major depressive disorder), single episode, moderate (HCC)   Chronic, stable  venlafaxine  XL 150 mg p.o. daily.   Occasionally using alprazolam  as needed.  Trazodone   50 mg po QhS prn insomnia  Seeing counselor.      OSA (obstructive sleep apnea)   Well controlled  on  CPAP.  Dr. Alva.       Steatohepatitis    She has had significant weight loss.. due for re-eval of liver function.      Vitamin D  deficiency   Due for reevaluation.      Relevant Orders   VITAMIN D  25 Hydroxy (Vit-D Deficiency, Fractures)   Other Visit Diagnoses       Routine general medical examination at a health care facility    -  Primary     Osteopenia, unspecified location       Relevant Orders   DG Bone Density         Greig Ring, MD

## 2024-05-14 NOTE — Assessment & Plan Note (Signed)
 She has had significant weight loss.. due for re-eval of liver function.

## 2024-05-14 NOTE — Assessment & Plan Note (Signed)
 Due for reevaluation

## 2024-05-14 NOTE — Assessment & Plan Note (Signed)
 Chronic, stable  venlafaxine XL 150 mg p.o. daily.   Occasionally using alprazolam as needed.  Trazodone  50 mg po QhS prn insomnia  Seeing counselor.

## 2024-05-14 NOTE — Assessment & Plan Note (Signed)
On statin. Followed by cardiology

## 2024-05-14 NOTE — Assessment & Plan Note (Signed)
 Well controlled  on CPAP.  Dr. Alva.

## 2024-05-14 NOTE — Assessment & Plan Note (Signed)
 Chronic, due for re-eval.  Rosuvastatin 10 mg daily Zetia 10 mg p.o. daily

## 2024-05-14 NOTE — Assessment & Plan Note (Addendum)
 Chronic, well controlled  Max lisinopril  40 mg daily, max amlodipine  10 mg daily, coreg25 mg BID  and  cardura  prn

## 2024-05-15 ENCOUNTER — Ambulatory Visit: Payer: Self-pay | Admitting: Family Medicine

## 2024-05-15 DIAGNOSIS — E871 Hypo-osmolality and hyponatremia: Secondary | ICD-10-CM

## 2024-05-15 DIAGNOSIS — E875 Hyperkalemia: Secondary | ICD-10-CM

## 2024-05-20 ENCOUNTER — Ambulatory Visit: Payer: Self-pay | Admitting: Family Medicine

## 2024-05-22 ENCOUNTER — Other Ambulatory Visit: Payer: Self-pay | Admitting: Cardiovascular Disease

## 2024-05-26 ENCOUNTER — Other Ambulatory Visit (INDEPENDENT_AMBULATORY_CARE_PROVIDER_SITE_OTHER)

## 2024-05-26 DIAGNOSIS — E875 Hyperkalemia: Secondary | ICD-10-CM

## 2024-05-26 DIAGNOSIS — E871 Hypo-osmolality and hyponatremia: Secondary | ICD-10-CM | POA: Diagnosis not present

## 2024-05-26 LAB — BASIC METABOLIC PANEL WITH GFR
BUN: 11 mg/dL (ref 6–23)
CO2: 30 meq/L (ref 19–32)
Calcium: 10 mg/dL (ref 8.4–10.5)
Chloride: 97 meq/L (ref 96–112)
Creatinine, Ser: 0.52 mg/dL (ref 0.40–1.20)
GFR: 91.87 mL/min
Glucose, Bld: 98 mg/dL (ref 70–99)
Potassium: 4.9 meq/L (ref 3.5–5.1)
Sodium: 134 meq/L — ABNORMAL LOW (ref 135–145)

## 2024-05-27 ENCOUNTER — Other Ambulatory Visit: Payer: Self-pay | Admitting: Cardiovascular Disease

## 2024-05-28 ENCOUNTER — Ambulatory Visit

## 2024-05-28 ENCOUNTER — Ambulatory Visit: Payer: Self-pay | Admitting: Family Medicine

## 2024-05-28 VITALS — Ht 64.0 in | Wt 148.0 lb

## 2024-05-28 DIAGNOSIS — Z0000 Encounter for general adult medical examination without abnormal findings: Secondary | ICD-10-CM | POA: Diagnosis not present

## 2024-05-28 NOTE — Progress Notes (Signed)
 No critical labs need to be addressed urgently. We will discuss labs in detail at upcoming office visit.

## 2024-05-28 NOTE — Patient Instructions (Signed)
 Christy Freeman,  Thank you for taking the time for your Medicare Wellness Visit. I appreciate your continued commitment to your health goals. Please review the care plan we discussed, and feel free to reach out if I can assist you further.  Please note that Annual Wellness Visits do not include a physical exam. Some assessments may be limited, especially if the visit was conducted virtually. If needed, we may recommend an in-person follow-up with your provider.  Ongoing Care Seeing your primary care provider every 3 to 6 months helps us  monitor your health and provide consistent, personalized care.   Referrals If a referral was made during today's visit and you haven't received any updates within two weeks, please contact the referred provider directly to check on the status.  Recommended Screenings:  Health Maintenance  Topic Date Due   DTaP/Tdap/Td vaccine (2 - Tdap) 08/09/2022   Medicare Annual Wellness Visit  05/08/2024   Osteoporosis screening with Bone Density Scan  06/26/2024   COVID-19 Vaccine (8 - 2025-26 season) 08/15/2024   Breast Cancer Screening  05/13/2026   Pneumococcal Vaccine for age over 62  Completed   Flu Shot  Completed   Hepatitis C Screening  Completed   Zoster (Shingles) Vaccine  Completed   Meningitis B Vaccine  Aged Out   Colon Cancer Screening  Discontinued       05/28/2024    8:17 AM  Advanced Directives  Does Patient Have a Medical Advance Directive? Yes  Type of Estate Agent of [place];Living will  Copy of Healthcare Power of Attorney in Chart? Yes - validated most recent copy scanned in chart (See row information)    Vision: Annual vision screenings are recommended for early detection of glaucoma, cataracts, and diabetic retinopathy. These exams can also reveal signs of chronic conditions such as diabetes and high blood pressure.  Dental: Annual dental screenings help detect early signs of oral cancer, gum disease, and other  conditions linked to overall health, including heart disease and diabetes.

## 2024-05-28 NOTE — Progress Notes (Signed)
 "  Please attest and cosign this visit due to patients primary care provider not being in the office at the time the visit was completed.     Chief Complaint  Patient presents with   Medicare Wellness     Subjective:   Christy Freeman is a 73 y.o. female who presents for a Medicare Annual Wellness Visit.  Visit info / Clinical Intake: Medicare Wellness Visit Type:: Subsequent Annual Wellness Visit Persons participating in visit and providing information:: patient Medicare Wellness Visit Mode:: Telephone If telephone:: video declined Since this visit was completed virtually, some vitals may be partially provided or unavailable. Missing vitals are due to the limitations of the virtual format.: Unable to obtain vitals - no equipment If Telephone or Video please confirm:: I connected with patient using audio/video enable telemedicine. I verified patient identity with two identifiers, discussed telehealth limitations, and patient agreed to proceed. Patient Location:: home Provider Location:: office Interpreter Needed?: No Pre-visit prep was completed: yes AWV questionnaire completed by patient prior to visit?: no Living arrangements:: lives with spouse/significant other Patient's Overall Health Status Rating: very good Typical amount of pain: some Does pain affect daily life?: no Are you currently prescribed opioids?: no  Dietary Habits and Nutritional Risks How many meals a day?: 3 Eats fruit and vegetables daily?: yes Most meals are obtained by: preparing own meals In the last 2 weeks, have you had any of the following?: none Diabetic:: no  Functional Status Activities of Daily Living (to include ambulation/medication): Independent Ambulation: Independent Medication Administration: Independent Home Management (perform basic housework or laundry): Independent Manage your own finances?: yes Primary transportation is: driving Concerns about vision?: no *vision screening is  required for WTM* Concerns about hearing?: no  Fall Screening Falls in the past year?: 0 Number of falls in past year: 0 Was there an injury with Fall?: 0 Fall Risk Category Calculator: 0 Patient Fall Risk Level: Low Fall Risk  Fall Risk Patient at Risk for Falls Due to: No Fall Risks Fall risk Follow up: Education provided; Falls prevention discussed; Falls evaluation completed  Home and Transportation Safety: All rugs have non-skid backing?: yes All stairs or steps have railings?: yes Grab bars in the bathtub or shower?: yes Have non-skid surface in bathtub or shower?: (!) no Good home lighting?: yes Regular seat belt use?: yes Hospital stays in the last year:: no  Cognitive Assessment Difficulty concentrating, remembering, or making decisions? : no Will 6CIT or Mini Cog be Completed: yes What year is it?: 0 points What month is it?: 0 points Give patient an address phrase to remember (5 components): [address] [place]  About what time is it?: 0 points Count backwards from 20 to 1: 0 points Say the months of the year in reverse: 0 points Repeat the address phrase from earlier: 0 points 6 CIT Score: 0 points  Advance Directives (For Healthcare) Does Patient Have a Medical Advance Directive?: Yes Type of Advance Directive: Healthcare Power of [place]; Living will Copy of Healthcare Power of Attorney in Chart?: Yes - validated most recent copy scanned in chart (See row information) Copy of Living Will in Chart?: Yes - validated most recent copy scanned in chart (See row information) Would patient like information on creating a medical advance directive?: No - Patient declined  Reviewed/Updated  Reviewed/Updated: Reviewed All (Medical, Surgical, Family, Medications, Allergies, Care Teams, Patient Goals)   Allergies (verified) Patient has no known allergies.   Current Medications (verified) Outpatient Encounter Medications as of  05/28/2024  Medication Sig    ezetimibe  (ZETIA ) 10 MG tablet TAKE 1 TABLET(10 MG) BY MOUTH DAILY   Alpha-Lipoic Acid 200 MG CAPS Take by mouth.   ALPRAZolam  (XANAX ) 0.25 MG tablet Take 1 tablet (0.25 mg total) by mouth daily as needed for anxiety.   amLODipine  (NORVASC ) 10 MG tablet TAKE 1 TABLET(10 MG) BY MOUTH DAILY   Ascorbic Acid (VITAMIN C PO) Take by mouth. 40 mg   aspirin EC 81 MG tablet Take 81 mg by mouth daily. Swallow whole.   carvedilol  (COREG ) 25 MG tablet TAKE 1 TABLET(25 MG) BY MOUTH TWICE DAILY   cetirizine (ZYRTEC) 10 MG tablet Take 10 mg by mouth daily.   Cholecalciferol (VITAMIN D3 PO) Take by mouth. 1000 units 2 times daily   Coenzyme Q10 (COQ10) 100 MG CAPS Take by mouth.   doxazosin  (CARDURA ) 1 MG tablet TAKE 1 TABLET BY MOUTH DAILY AS NEEDED FOR BLOOD PRESSURE GREATER THAN 160   estradiol  (ESTRACE ) 0.01 % CREA vaginal cream Place 0.25 Applicatorfuls vaginally 3 (three) times a week.   Folic Acid (FOLATE PO) Take by mouth. 400 mg   lisinopril  (ZESTRIL ) 40 MG tablet TAKE 1 TABLET(40 MG) BY MOUTH DAILY   MAGNESIUM PO Take 425 mg by mouth 2 (two) times daily.   Omega-3 Fatty Acids (OMEGA 3 PO) Take by mouth. 1280 mg   omeprazole  (PRILOSEC) 10 MG capsule TAKE 1 CAPSULE(10 MG) BY MOUTH DAILY   Probiotic Product (PROBIOTIC DAILY PO) Take 1 capsule by mouth in the morning, at noon, and at bedtime.   rosuvastatin  (CRESTOR ) 10 MG tablet TAKE 1 TABLET(10 MG) BY MOUTH DAILY   thiamine (VITAMIN B-1) 100 MG tablet Take 100 mg by mouth daily.   tirzepatide  (ZEPBOUND ) 15 MG/0.5ML Pen Inject 15 mg into the skin once a week.   venlafaxine  XR (EFFEXOR -XR) 150 MG 24 hr capsule TAKE 1 CAPSULE(150 MG) BY MOUTH DAILY   vitamin B-12 (CYANOCOBALAMIN) 1000 MCG tablet Take 1,000 mcg by mouth daily.   No facility-administered encounter medications on file as of 05/28/2024.    History: Past Medical History:  Diagnosis Date   Allergy    seasonal allergies   Anxiety    hx- on meds   Arthritis    Right hand   Blood  transfusion without reported diagnosis 1989   Cancer (HCC)    basal cell on face   Cataract    Colon polyps    GERD (gastroesophageal reflux disease)    H/O: depression    Hypercholesterolemia    diet controlled   Hypertension    Lung nodule    left lung    Psoriasis    Past Surgical History:  Procedure Laterality Date   APPENDECTOMY  06/14/2017   BREAST BIOPSY Right 2000   benign   COLONOSCOPY  2016   TA x 1   CYST REMOVAL HAND Left    DILATION AND CURETTAGE OF UTERUS     LAPAROSCOPIC APPENDECTOMY N/A 06/14/2017   Procedure: APPENDECTOMY LAPAROSCOPIC;  Surgeon: Shelva Dunnings, MD;  Location: [facility] ORS;  Service: General;  Laterality: N/A;   POLYPECTOMY  2016   TA   Family History  Problem Relation Age of Onset   Diabetes Mother    Heart failure Mother    Hypertension Mother    Stroke Father    Hypertension Father    Heart disease Brother    Stroke Maternal Grandmother    Breast cancer Other 35       neice  Colon cancer Neg Hx    Colon polyps Neg Hx    Esophageal cancer Neg Hx    Stomach cancer Neg Hx    Rectal cancer Neg Hx    Social History   Occupational History    Comment:  charity fundraiser  Tobacco Use   Smoking status: Never    Passive exposure: Never   Smokeless tobacco: Never  Vaping Use   Vaping status: Never Used  Substance and Sexual Activity   Alcohol use: Yes    Alcohol/week: 14.0 standard drinks of alcohol    Types: 6 Glasses of wine, 8 Standard drinks or equivalent per week    Comment: 2 glasses of wine per night   Drug use: No   Sexual activity: Yes   Tobacco Counseling Counseling given: Not Answered  SDOH Screenings   Food Insecurity: No Food Insecurity (05/28/2024)  Housing: Unknown (05/28/2024)  Transportation Needs: No Transportation Needs (05/28/2024)  Utilities: Not At Risk (05/28/2024)  Alcohol Screen: Low Risk (05/05/2024)  Depression (PHQ2-9): Low Risk (05/28/2024)  Financial Resource Strain: Low Risk (05/05/2024)  Physical  Activity: Sufficiently Active (05/28/2024)  Social Connections: Socially Integrated (05/28/2024)  Stress: No Stress Concern Present (05/28/2024)  Tobacco Use: Low Risk (05/28/2024)  Health Literacy: Adequate Health Literacy (05/28/2024)   See flowsheets for full screening details  Depression Screen PHQ 2 & 9 Depression Scale- Over the past 2 weeks, how often have you been bothered by any of the following problems? Little interest or pleasure in doing things: 0 Feeling down, depressed, or hopeless (PHQ Adolescent also includes...irritable): 0 PHQ-2 Total Score: 0 Trouble falling or staying asleep, or sleeping too much: 0 Feeling tired or having little energy: 0 Poor appetite or overeating (PHQ Adolescent also includes...weight loss): 0 Feeling bad about yourself - or that you are a failure or have let yourself or your family down: 0 Trouble concentrating on things, such as reading the newspaper or watching television (PHQ Adolescent also includes...like school work): 0 Moving or speaking so slowly that other people could have noticed. Or the opposite - being so fidgety or restless that you have been moving around a lot more than usual: 0 Thoughts that you would be better off dead, or of hurting yourself in some way: 0 PHQ-9 Total Score: 0 If you checked off any problems, how difficult have these problems made it for you to do your work, take care of things at home, or get along with other people?: Not difficult at all     Goals Addressed             This Visit's Progress    i would like to maintain my weight,tone and weight training       COMPLETED: Increase physical activity       Starting 08/03/2016, I will continue to exercise at least 30 min 5 days per week.      Patient Stated   On track    Reducing triglycerides            Objective:    Today's Vitals   05/28/24 0832  Weight: 148 lb (67.1 kg)  Height: 5' 4 (1.626 m)   Body mass index is 25.4  kg/m.  Hearing/Vision screen Vision Screening - Comments:: UTD w/visits to Dr Myrna Immunizations and Health Maintenance Health Maintenance  Topic Date Due   DTaP/Tdap/Td (2 - Tdap) 08/09/2022   Medicare Annual Wellness (AWV)  05/08/2024   Bone Density Scan  06/26/2024   COVID-19 Vaccine (8 - 2025-26  season) 08/15/2024   Mammogram  05/13/2026   Pneumococcal Vaccine: 50+ Years  Completed   Influenza Vaccine  Completed   Hepatitis C Screening  Completed   Zoster Vaccines- Shingrix  Completed   Meningococcal B Vaccine  Aged Out   Colonoscopy  Discontinued        Assessment/Plan:  This is a routine wellness examination for Christy Freeman.  Patient Care Team: Avelina Greig BRAVO, MD as PCP - General (Family Medicine) Perla Evalene PARAS, MD as PCP - Cardiology (Cardiology) Perla Evalene PARAS, MD as Consulting Physician (Cardiology) Aneita Gwendlyn DASEN, MD (Inactive) as Consulting Physician (Gastroenterology) Myrna Adine Anes, MD as Consulting Physician (Ophthalmology) Jude Harden GAILS, MD as Consulting Physician (Pulmonary Disease)  I have personally reviewed and noted the following in the patients chart:   Medical and social history Use of alcohol, tobacco or illicit drugs  Current medications and supplements including opioid prescriptions. Functional ability and status Nutritional status Physical activity Advanced directives List of other physicians Hospitalizations, surgeries, and ER visits in previous 12 months Vitals Screenings to include cognitive, depression, and falls Referrals and appointments  No orders of the defined types were placed in this encounter.  In addition, I have reviewed and discussed with patient certain preventive protocols, quality metrics, and best practice recommendations. A written personalized care plan for preventive services as well as general preventive health recommendations were provided to patient.   Erminio LITTIE Saris, LPN   [id number]   No follow-ups  on file.  After Visit Summary: (MyChart) Due to this being a telephonic visit, the after visit summary with patients personalized plan was offered to patient via MyChart   Nurse Notes: No voiced or noted concerns at this time Appointment(s) made: (AWV/CPR [phone])  "

## 2024-06-18 ENCOUNTER — Encounter: Payer: Self-pay | Admitting: Family Medicine

## 2024-06-18 DIAGNOSIS — M546 Pain in thoracic spine: Secondary | ICD-10-CM

## 2024-06-24 ENCOUNTER — Other Ambulatory Visit: Payer: Self-pay | Admitting: Family Medicine

## 2024-06-29 ENCOUNTER — Other Ambulatory Visit: Payer: Self-pay | Admitting: Family Medicine

## 2024-06-29 DIAGNOSIS — I10 Essential (primary) hypertension: Secondary | ICD-10-CM

## 2024-06-29 DIAGNOSIS — K2100 Gastro-esophageal reflux disease with esophagitis, without bleeding: Secondary | ICD-10-CM

## 2024-07-02 ENCOUNTER — Encounter: Payer: Self-pay | Admitting: Cardiovascular Disease

## 2024-07-02 MED ORDER — EZETIMIBE 10 MG PO TABS: 10.0000 mg | ORAL_TABLET | Freq: Every day | ORAL | 0 refills | Status: DC

## 2024-07-05 ENCOUNTER — Other Ambulatory Visit: Payer: Self-pay | Admitting: Cardiovascular Disease

## 2024-07-06 ENCOUNTER — Other Ambulatory Visit: Payer: Self-pay | Admitting: Family Medicine

## 2024-07-06 DIAGNOSIS — I10 Essential (primary) hypertension: Secondary | ICD-10-CM

## 2024-07-09 ENCOUNTER — Inpatient Hospital Stay: Admission: RE | Admit: 2024-07-09 | Discharge: 2024-07-09 | Attending: Family Medicine | Admitting: Family Medicine

## 2024-07-09 DIAGNOSIS — M8580 Other specified disorders of bone density and structure, unspecified site: Secondary | ICD-10-CM

## 2024-07-16 ENCOUNTER — Ambulatory Visit: Admitting: Medical

## 2025-06-03 ENCOUNTER — Ambulatory Visit

## 2025-06-03 ENCOUNTER — Encounter: Admitting: Family Medicine
